# Patient Record
Sex: Male | Born: 1955 | Race: White | Hispanic: No | Smoking: Current every day smoker
Health system: Southern US, Community
[De-identification: ages and names within clinical notes are randomized; demographics above are authoritative.]

## PROBLEM LIST (undated history)

## (undated) DIAGNOSIS — I10 Essential (primary) hypertension: Secondary | ICD-10-CM

## (undated) DIAGNOSIS — E785 Hyperlipidemia, unspecified: Secondary | ICD-10-CM

## (undated) DIAGNOSIS — J309 Allergic rhinitis, unspecified: Secondary | ICD-10-CM

## (undated) DIAGNOSIS — R7309 Other abnormal glucose: Secondary | ICD-10-CM

## (undated) DIAGNOSIS — Z8619 Personal history of other infectious and parasitic diseases: Secondary | ICD-10-CM

## (undated) DIAGNOSIS — M1990 Unspecified osteoarthritis, unspecified site: Secondary | ICD-10-CM

## (undated) DIAGNOSIS — G4730 Sleep apnea, unspecified: Secondary | ICD-10-CM

## (undated) DIAGNOSIS — Z9289 Personal history of other medical treatment: Secondary | ICD-10-CM

## (undated) DIAGNOSIS — Z8719 Personal history of other diseases of the digestive system: Secondary | ICD-10-CM

## (undated) DIAGNOSIS — I471 Supraventricular tachycardia, unspecified: Secondary | ICD-10-CM

## (undated) DIAGNOSIS — J45909 Unspecified asthma, uncomplicated: Secondary | ICD-10-CM

## (undated) DIAGNOSIS — N529 Male erectile dysfunction, unspecified: Secondary | ICD-10-CM

## (undated) DIAGNOSIS — B029 Zoster without complications: Secondary | ICD-10-CM

## (undated) DIAGNOSIS — R0602 Shortness of breath: Secondary | ICD-10-CM

## (undated) DIAGNOSIS — E119 Type 2 diabetes mellitus without complications: Secondary | ICD-10-CM

## (undated) DIAGNOSIS — F528 Other sexual dysfunction not due to a substance or known physiological condition: Secondary | ICD-10-CM

## (undated) HISTORY — DX: Essential (primary) hypertension: I10

## (undated) HISTORY — DX: Supraventricular tachycardia, unspecified: I47.10

## (undated) HISTORY — DX: Unspecified asthma, uncomplicated: J45.909

## (undated) HISTORY — DX: Supraventricular tachycardia: I47.1

## (undated) HISTORY — DX: Allergic rhinitis, unspecified: J30.9

## (undated) HISTORY — DX: Hyperlipidemia, unspecified: E78.5

## (undated) HISTORY — DX: Other abnormal glucose: R73.09

## (undated) HISTORY — PX: OTHER SURGICAL HISTORY: SHX169

## (undated) HISTORY — DX: Other sexual dysfunction not due to a substance or known physiological condition: F52.8

## (undated) HISTORY — DX: Zoster without complications: B02.9

---

## 1998-01-26 ENCOUNTER — Emergency Department (HOSPITAL_COMMUNITY): Admission: EM | Admit: 1998-01-26 | Discharge: 1998-01-26 | Payer: Self-pay | Admitting: Emergency Medicine

## 1998-05-04 ENCOUNTER — Emergency Department (HOSPITAL_COMMUNITY): Admission: EM | Admit: 1998-05-04 | Discharge: 1998-05-04 | Payer: Self-pay | Admitting: Emergency Medicine

## 1999-10-06 ENCOUNTER — Emergency Department (HOSPITAL_COMMUNITY): Admission: EM | Admit: 1999-10-06 | Discharge: 1999-10-06 | Payer: Self-pay | Admitting: *Deleted

## 2001-07-18 ENCOUNTER — Encounter: Payer: Self-pay | Admitting: Emergency Medicine

## 2001-07-18 ENCOUNTER — Emergency Department (HOSPITAL_COMMUNITY): Admission: EM | Admit: 2001-07-18 | Discharge: 2001-07-18 | Payer: Self-pay | Admitting: Emergency Medicine

## 2001-07-20 ENCOUNTER — Encounter: Payer: Self-pay | Admitting: Emergency Medicine

## 2001-07-20 ENCOUNTER — Emergency Department (HOSPITAL_COMMUNITY): Admission: EM | Admit: 2001-07-20 | Discharge: 2001-07-20 | Payer: Self-pay | Admitting: Emergency Medicine

## 2001-08-08 ENCOUNTER — Emergency Department (HOSPITAL_COMMUNITY): Admission: EM | Admit: 2001-08-08 | Discharge: 2001-08-08 | Payer: Self-pay | Admitting: Emergency Medicine

## 2001-08-08 ENCOUNTER — Encounter: Payer: Self-pay | Admitting: Emergency Medicine

## 2001-08-23 ENCOUNTER — Emergency Department (HOSPITAL_COMMUNITY): Admission: EM | Admit: 2001-08-23 | Discharge: 2001-08-23 | Payer: Self-pay | Admitting: Emergency Medicine

## 2001-08-26 ENCOUNTER — Emergency Department (HOSPITAL_COMMUNITY): Admission: EM | Admit: 2001-08-26 | Discharge: 2001-08-26 | Payer: Self-pay | Admitting: Emergency Medicine

## 2001-09-14 ENCOUNTER — Ambulatory Visit (HOSPITAL_COMMUNITY): Admission: RE | Admit: 2001-09-14 | Discharge: 2001-09-14 | Payer: Self-pay | Admitting: Endocrinology

## 2001-09-14 ENCOUNTER — Encounter: Payer: Self-pay | Admitting: Endocrinology

## 2001-09-22 ENCOUNTER — Emergency Department (HOSPITAL_COMMUNITY): Admission: EM | Admit: 2001-09-22 | Discharge: 2001-09-22 | Payer: Self-pay

## 2001-10-29 ENCOUNTER — Emergency Department (HOSPITAL_COMMUNITY): Admission: EM | Admit: 2001-10-29 | Discharge: 2001-10-29 | Payer: Self-pay | Admitting: Emergency Medicine

## 2001-11-01 HISTORY — PX: OTHER SURGICAL HISTORY: SHX169

## 2003-03-17 ENCOUNTER — Emergency Department (HOSPITAL_COMMUNITY): Admission: EM | Admit: 2003-03-17 | Discharge: 2003-03-17 | Payer: Self-pay | Admitting: Emergency Medicine

## 2003-03-17 ENCOUNTER — Encounter: Payer: Self-pay | Admitting: Emergency Medicine

## 2003-05-26 HISTORY — PX: SHOULDER SURGERY: SHX246

## 2003-06-08 ENCOUNTER — Emergency Department (HOSPITAL_COMMUNITY): Admission: EM | Admit: 2003-06-08 | Discharge: 2003-06-08 | Payer: Self-pay | Admitting: Emergency Medicine

## 2003-06-13 ENCOUNTER — Ambulatory Visit (HOSPITAL_COMMUNITY): Admission: RE | Admit: 2003-06-13 | Discharge: 2003-06-13 | Payer: Self-pay | Admitting: Orthopedic Surgery

## 2003-06-13 ENCOUNTER — Ambulatory Visit (HOSPITAL_BASED_OUTPATIENT_CLINIC_OR_DEPARTMENT_OTHER): Admission: RE | Admit: 2003-06-13 | Discharge: 2003-06-13 | Payer: Self-pay | Admitting: Orthopedic Surgery

## 2003-08-10 ENCOUNTER — Emergency Department (HOSPITAL_COMMUNITY): Admission: EM | Admit: 2003-08-10 | Discharge: 2003-08-10 | Payer: Self-pay | Admitting: Emergency Medicine

## 2003-10-17 ENCOUNTER — Ambulatory Visit (HOSPITAL_COMMUNITY): Admission: RE | Admit: 2003-10-17 | Discharge: 2003-10-17 | Payer: Self-pay | Admitting: Orthopedic Surgery

## 2003-10-17 ENCOUNTER — Ambulatory Visit (HOSPITAL_BASED_OUTPATIENT_CLINIC_OR_DEPARTMENT_OTHER): Admission: RE | Admit: 2003-10-17 | Discharge: 2003-10-17 | Payer: Self-pay | Admitting: Orthopedic Surgery

## 2003-11-01 ENCOUNTER — Emergency Department (HOSPITAL_COMMUNITY): Admission: EM | Admit: 2003-11-01 | Discharge: 2003-11-01 | Payer: Self-pay | Admitting: Emergency Medicine

## 2004-02-18 ENCOUNTER — Emergency Department (HOSPITAL_COMMUNITY): Admission: EM | Admit: 2004-02-18 | Discharge: 2004-02-18 | Payer: Self-pay | Admitting: Emergency Medicine

## 2004-05-12 ENCOUNTER — Ambulatory Visit: Payer: Self-pay | Admitting: Endocrinology

## 2004-06-23 ENCOUNTER — Emergency Department (HOSPITAL_COMMUNITY): Admission: EM | Admit: 2004-06-23 | Discharge: 2004-06-23 | Payer: Self-pay | Admitting: Emergency Medicine

## 2004-06-30 ENCOUNTER — Emergency Department (HOSPITAL_COMMUNITY): Admission: EM | Admit: 2004-06-30 | Discharge: 2004-06-30 | Payer: Self-pay | Admitting: Emergency Medicine

## 2004-12-15 ENCOUNTER — Ambulatory Visit: Payer: Self-pay | Admitting: Endocrinology

## 2004-12-18 ENCOUNTER — Ambulatory Visit: Payer: Self-pay | Admitting: Endocrinology

## 2004-12-29 ENCOUNTER — Ambulatory Visit: Payer: Self-pay | Admitting: Endocrinology

## 2005-05-04 ENCOUNTER — Emergency Department (HOSPITAL_COMMUNITY): Admission: EM | Admit: 2005-05-04 | Discharge: 2005-05-04 | Payer: Self-pay | Admitting: Emergency Medicine

## 2005-10-08 ENCOUNTER — Emergency Department (HOSPITAL_COMMUNITY): Admission: EM | Admit: 2005-10-08 | Discharge: 2005-10-08 | Payer: Self-pay | Admitting: Emergency Medicine

## 2005-12-14 ENCOUNTER — Ambulatory Visit: Payer: Self-pay | Admitting: Endocrinology

## 2005-12-21 ENCOUNTER — Ambulatory Visit: Payer: Self-pay | Admitting: Endocrinology

## 2007-01-10 ENCOUNTER — Ambulatory Visit: Payer: Self-pay | Admitting: Endocrinology

## 2007-01-10 LAB — CONVERTED CEMR LAB
ALT: 41 units/L (ref 0–53)
Albumin: 3.6 g/dL (ref 3.5–5.2)
Alkaline Phosphatase: 54 units/L (ref 39–117)
BUN: 11 mg/dL (ref 6–23)
Basophils Absolute: 0.1 10*3/uL (ref 0.0–0.1)
Basophils Relative: 0.7 % (ref 0.0–1.0)
Bilirubin, Direct: 0.1 mg/dL (ref 0.0–0.3)
CO2: 26 meq/L (ref 19–32)
Calcium: 8.9 mg/dL (ref 8.4–10.5)
Creatinine, Ser: 0.8 mg/dL (ref 0.4–1.5)
Crystals: NEGATIVE
Eosinophils Absolute: 0.2 10*3/uL (ref 0.0–0.6)
Eosinophils Relative: 2.4 % (ref 0.0–5.0)
GFR calc Af Amer: 132 mL/min
GFR calc non Af Amer: 109 mL/min
HDL: 31 mg/dL — ABNORMAL LOW (ref >39.0)
Hemoglobin: 14.9 g/dL (ref 13.0–17.0)
Ketones, ur: NEGATIVE mg/dL
LDL Cholesterol: 70 mg/dL (ref 0–99)
Leukocytes, UA: NEGATIVE
Lymphocytes Relative: 25.4 % (ref 12.0–46.0)
MCHC: 34.7 g/dL (ref 30.0–36.0)
MCV: 90.5 fL (ref 78.0–100.0)
Monocytes Absolute: 0.3 10*3/uL (ref 0.2–0.7)
Monocytes Relative: 4.6 % (ref 3.0–11.0)
Neutro Abs: 5.1 10*3/uL (ref 1.4–7.7)
Neutrophils Relative %: 66.9 % (ref 43.0–77.0)
Nitrite: NEGATIVE
PSA: 0.48 ng/mL (ref 0.10–4.00)
Platelets: 172 10*3/uL (ref 150–400)
Potassium: 4.1 meq/L (ref 3.5–5.1)
RBC: 4.73 M/uL (ref 4.22–5.81)
RDW: 12.6 % (ref 11.5–14.6)
Sodium: 142 meq/L (ref 135–145)
Specific Gravity, Urine: 1.025 (ref 1.000–1.03)
TSH: 0.88 microintl units/mL (ref 0.35–5.50)
Total Bilirubin: 0.6 mg/dL (ref 0.3–1.2)
Total CHOL/HDL Ratio: 4.2
Total Protein, Urine: NEGATIVE mg/dL
Total Protein: 6.8 g/dL (ref 6.0–8.3)
Triglycerides: 148 mg/dL (ref 0–149)
Urine Glucose: NEGATIVE mg/dL
VLDL: 30 mg/dL (ref 0–40)
WBC: 7.6 10*3/uL (ref 4.5–10.5)
pH: 5.5 (ref 5.0–8.0)

## 2007-01-13 ENCOUNTER — Ambulatory Visit: Payer: Self-pay | Admitting: Endocrinology

## 2007-03-01 ENCOUNTER — Encounter: Payer: Self-pay | Admitting: *Deleted

## 2007-03-01 DIAGNOSIS — E785 Hyperlipidemia, unspecified: Secondary | ICD-10-CM | POA: Insufficient documentation

## 2007-03-01 DIAGNOSIS — F17200 Nicotine dependence, unspecified, uncomplicated: Secondary | ICD-10-CM | POA: Insufficient documentation

## 2007-03-01 DIAGNOSIS — J309 Allergic rhinitis, unspecified: Secondary | ICD-10-CM | POA: Insufficient documentation

## 2007-03-01 DIAGNOSIS — B029 Zoster without complications: Secondary | ICD-10-CM | POA: Insufficient documentation

## 2007-03-01 DIAGNOSIS — J45909 Unspecified asthma, uncomplicated: Secondary | ICD-10-CM | POA: Insufficient documentation

## 2007-03-01 DIAGNOSIS — F528 Other sexual dysfunction not due to a substance or known physiological condition: Secondary | ICD-10-CM | POA: Insufficient documentation

## 2007-03-01 DIAGNOSIS — R7309 Other abnormal glucose: Secondary | ICD-10-CM | POA: Insufficient documentation

## 2007-03-01 DIAGNOSIS — I10 Essential (primary) hypertension: Secondary | ICD-10-CM | POA: Insufficient documentation

## 2007-05-03 ENCOUNTER — Emergency Department (HOSPITAL_COMMUNITY): Admission: EM | Admit: 2007-05-03 | Discharge: 2007-05-03 | Payer: Self-pay | Admitting: Emergency Medicine

## 2007-10-11 ENCOUNTER — Emergency Department (HOSPITAL_COMMUNITY): Admission: EM | Admit: 2007-10-11 | Discharge: 2007-10-11 | Payer: Self-pay | Admitting: Emergency Medicine

## 2008-01-16 ENCOUNTER — Ambulatory Visit: Payer: Self-pay | Admitting: Endocrinology

## 2008-01-18 LAB — CONVERTED CEMR LAB
ALT: 23 units/L (ref 0–53)
AST: 16 units/L (ref 0–37)
Albumin: 3.6 g/dL (ref 3.5–5.2)
Alkaline Phosphatase: 56 units/L (ref 39–117)
BUN: 11 mg/dL (ref 6–23)
Basophils Absolute: 0.1 10*3/uL (ref 0.0–0.1)
Basophils Relative: 0.7 % (ref 0.0–3.0)
Bilirubin Urine: NEGATIVE
Bilirubin, Direct: 0.1 mg/dL (ref 0.0–0.3)
CO2: 29 meq/L (ref 19–32)
Calcium: 8.7 mg/dL (ref 8.4–10.5)
Chloride: 109 meq/L (ref 96–112)
Cholesterol: 114 mg/dL (ref 0–200)
Creatinine, Ser: 0.8 mg/dL (ref 0.4–1.5)
Crystals: NEGATIVE
Eosinophils Absolute: 0.2 10*3/uL (ref 0.0–0.7)
Eosinophils Relative: 1.9 % (ref 0.0–5.0)
GFR calc Af Amer: 131 mL/min
GFR calc non Af Amer: 108 mL/min
Glucose, Bld: 105 mg/dL — ABNORMAL HIGH (ref 70–99)
HCT: 44.7 % (ref 39.0–52.0)
HDL: 28.5 mg/dL — ABNORMAL LOW (ref >39.0)
Hemoglobin: 15.5 g/dL (ref 13.0–17.0)
Ketones, ur: NEGATIVE mg/dL
LDL Cholesterol: 66 mg/dL (ref 0–99)
Leukocytes, UA: NEGATIVE
Lymphocytes Relative: 25.6 % (ref 12.0–46.0)
MCHC: 34.6 g/dL (ref 30.0–36.0)
MCV: 91.9 fL (ref 78.0–100.0)
Monocytes Absolute: 0.5 10*3/uL (ref 0.1–1.0)
Monocytes Relative: 5.1 % (ref 3.0–12.0)
Mucus, UA: NEGATIVE
Neutro Abs: 6.6 10*3/uL (ref 1.4–7.7)
Neutrophils Relative %: 66.7 % (ref 43.0–77.0)
Nitrite: NEGATIVE
Platelets: 196 10*3/uL (ref 150–400)
Potassium: 3.8 meq/L (ref 3.5–5.1)
RBC: 4.87 M/uL (ref 4.22–5.81)
RDW: 12.5 % (ref 11.5–14.6)
Sodium: 145 meq/L (ref 135–145)
Specific Gravity, Urine: 1.02 (ref 1.000–1.03)
TSH: 1.15 microintl units/mL (ref 0.35–5.50)
Total Bilirubin: 0.7 mg/dL (ref 0.3–1.2)
Total CHOL/HDL Ratio: 4
Total Protein, Urine: NEGATIVE mg/dL
Total Protein: 6.8 g/dL (ref 6.0–8.3)
Triglycerides: 97 mg/dL (ref 0–149)
Urine Glucose: NEGATIVE mg/dL
Urobilinogen, UA: 0.2 (ref 0.0–1.0)
VLDL: 19 mg/dL (ref 0–40)
WBC: 10 10*3/uL (ref 4.5–10.5)
pH: 6 (ref 5.0–8.0)

## 2008-01-23 ENCOUNTER — Ambulatory Visit: Payer: Self-pay | Admitting: Endocrinology

## 2008-01-23 DIAGNOSIS — M25519 Pain in unspecified shoulder: Secondary | ICD-10-CM | POA: Insufficient documentation

## 2008-02-13 ENCOUNTER — Telehealth (INDEPENDENT_AMBULATORY_CARE_PROVIDER_SITE_OTHER): Payer: Self-pay | Admitting: *Deleted

## 2008-03-12 ENCOUNTER — Emergency Department (HOSPITAL_COMMUNITY): Admission: EM | Admit: 2008-03-12 | Discharge: 2008-03-12 | Payer: Self-pay | Admitting: Emergency Medicine

## 2008-12-26 ENCOUNTER — Emergency Department (HOSPITAL_COMMUNITY): Admission: EM | Admit: 2008-12-26 | Discharge: 2008-12-26 | Payer: Self-pay | Admitting: Emergency Medicine

## 2009-03-18 ENCOUNTER — Ambulatory Visit: Payer: Self-pay | Admitting: Endocrinology

## 2009-03-18 LAB — CONVERTED CEMR LAB
ALT: 25 units/L (ref 0–53)
AST: 17 units/L (ref 0–37)
Albumin: 3.8 g/dL (ref 3.5–5.2)
Alkaline Phosphatase: 56 units/L (ref 39–117)
Basophils Absolute: 0.1 10*3/uL (ref 0.0–0.1)
Basophils Relative: 0.9 % (ref 0.0–3.0)
Bilirubin, Direct: 0.1 mg/dL (ref 0.0–0.3)
CO2: 33 meq/L — ABNORMAL HIGH (ref 19–32)
Calcium: 9.3 mg/dL (ref 8.4–10.5)
Chloride: 100 meq/L (ref 96–112)
Cholesterol: 115 mg/dL (ref 0–200)
Creatinine, Ser: 0.8 mg/dL (ref 0.4–1.5)
Eosinophils Absolute: 0.2 10*3/uL (ref 0.0–0.7)
Eosinophils Relative: 1.9 % (ref 0.0–5.0)
GFR calc non Af Amer: 107.57 mL/min (ref >60)
Glucose, Bld: 99 mg/dL (ref 70–99)
HCT: 48.1 % (ref 39.0–52.0)
HDL: 36.8 mg/dL — ABNORMAL LOW (ref >39.00)
Hemoglobin: 16.4 g/dL (ref 13.0–17.0)
LDL Cholesterol: 59 mg/dL (ref 0–99)
Leukocytes, UA: NEGATIVE
Lymphocytes Relative: 28.6 % (ref 12.0–46.0)
Lymphs Abs: 2.9 10*3/uL (ref 0.7–4.0)
MCHC: 34.1 g/dL (ref 30.0–36.0)
MCV: 95.5 fL (ref 78.0–100.0)
Monocytes Absolute: 0.5 10*3/uL (ref 0.1–1.0)
Monocytes Relative: 5.4 % (ref 3.0–12.0)
Neutro Abs: 6.3 10*3/uL (ref 1.4–7.7)
Neutrophils Relative %: 63.2 % (ref 43.0–77.0)
Nitrite: NEGATIVE
PSA: 0.46 ng/mL (ref 0.10–4.00)
Platelets: 168 10*3/uL (ref 150.0–400.0)
Potassium: 3.6 meq/L (ref 3.5–5.1)
RBC: 5.03 M/uL (ref 4.22–5.81)
RDW: 12.7 % (ref 11.5–14.6)
Sodium: 143 meq/L (ref 135–145)
Specific Gravity, Urine: >=1.03 (ref 1.000–1.030)
Total Bilirubin: 0.8 mg/dL (ref 0.3–1.2)
Total CHOL/HDL Ratio: 3
Total Protein, Urine: NEGATIVE mg/dL
Total Protein: 7.1 g/dL (ref 6.0–8.3)
Triglycerides: 96 mg/dL (ref 0.0–149.0)
Urobilinogen, UA: 0.2 (ref 0.0–1.0)
VLDL: 19.2 mg/dL (ref 0.0–40.0)
pH: 6 (ref 5.0–8.0)

## 2009-03-21 ENCOUNTER — Ambulatory Visit: Payer: Self-pay | Admitting: Endocrinology

## 2009-03-21 DIAGNOSIS — R21 Rash and other nonspecific skin eruption: Secondary | ICD-10-CM | POA: Insufficient documentation

## 2009-04-11 ENCOUNTER — Encounter: Payer: Self-pay | Admitting: Endocrinology

## 2009-06-05 ENCOUNTER — Emergency Department (HOSPITAL_COMMUNITY): Admission: EM | Admit: 2009-06-05 | Discharge: 2009-06-05 | Payer: Self-pay | Admitting: Emergency Medicine

## 2009-08-25 ENCOUNTER — Emergency Department (HOSPITAL_COMMUNITY): Admission: EM | Admit: 2009-08-25 | Discharge: 2009-08-25 | Payer: Self-pay | Admitting: Emergency Medicine

## 2009-10-24 ENCOUNTER — Emergency Department (HOSPITAL_COMMUNITY): Admission: EM | Admit: 2009-10-24 | Discharge: 2009-10-24 | Payer: Self-pay | Admitting: Emergency Medicine

## 2010-02-24 ENCOUNTER — Emergency Department (HOSPITAL_COMMUNITY): Admission: EM | Admit: 2010-02-24 | Discharge: 2010-02-24 | Payer: Self-pay | Admitting: Emergency Medicine

## 2010-03-18 ENCOUNTER — Ambulatory Visit: Payer: Self-pay | Admitting: Endocrinology

## 2010-03-18 LAB — CONVERTED CEMR LAB
ALT: 24 units/L (ref 0–53)
AST: 28 units/L (ref 0–37)
Albumin: 3.7 g/dL (ref 3.5–5.2)
Alkaline Phosphatase: 54 units/L (ref 39–117)
BUN: 11 mg/dL (ref 6–23)
Basophils Absolute: 0.1 10*3/uL (ref 0.0–0.1)
Basophils Relative: 1 % (ref 0.0–3.0)
Bilirubin Urine: NEGATIVE
Bilirubin, Direct: 0.6 mg/dL — ABNORMAL HIGH (ref 0.0–0.3)
CO2: 29 meq/L (ref 19–32)
Calcium: 8.9 mg/dL (ref 8.4–10.5)
Chloride: 97 meq/L (ref 96–112)
Cholesterol: 115 mg/dL (ref 0–200)
Creatinine, Ser: 0.7 mg/dL (ref 0.4–1.5)
Eosinophils Absolute: 0.2 10*3/uL (ref 0.0–0.7)
Eosinophils Relative: 1.7 % (ref 0.0–5.0)
GFR calc non Af Amer: 117.25 mL/min (ref >60)
Glucose, Bld: 97 mg/dL (ref 70–99)
HCT: 46.8 % (ref 39.0–52.0)
HDL: 33.4 mg/dL — ABNORMAL LOW (ref >39.00)
Hemoglobin: 16 g/dL (ref 13.0–17.0)
Ketones, ur: NEGATIVE mg/dL
LDL Cholesterol: 61 mg/dL (ref 0–99)
Leukocytes, UA: NEGATIVE
Lymphocytes Relative: 27.8 % (ref 12.0–46.0)
Lymphs Abs: 2.8 10*3/uL (ref 0.7–4.0)
MCHC: 34.1 g/dL (ref 30.0–36.0)
MCV: 94.3 fL (ref 78.0–100.0)
Monocytes Absolute: 0.5 10*3/uL (ref 0.1–1.0)
Monocytes Relative: 5.3 % (ref 3.0–12.0)
Neutro Abs: 6.5 10*3/uL (ref 1.4–7.7)
Neutrophils Relative %: 64.2 % (ref 43.0–77.0)
Nitrite: NEGATIVE
PSA: 0.41 ng/mL (ref 0.10–4.00)
Platelets: 189 10*3/uL (ref 150.0–400.0)
Potassium: 5 meq/L (ref 3.5–5.1)
RBC: 4.97 M/uL (ref 4.22–5.81)
RDW: 13.4 % (ref 11.5–14.6)
Sodium: 138 meq/L (ref 135–145)
Specific Gravity, Urine: 1.025 (ref 1.000–1.030)
TSH: 1.2 microintl units/mL (ref 0.35–5.50)
Total Bilirubin: 1.8 mg/dL — ABNORMAL HIGH (ref 0.3–1.2)
Total CHOL/HDL Ratio: 3
Total Protein, Urine: NEGATIVE mg/dL
Total Protein: 6.5 g/dL (ref 6.0–8.3)
Triglycerides: 104 mg/dL (ref 0.0–149.0)
Urine Glucose: NEGATIVE mg/dL
Urobilinogen, UA: 0.2 (ref 0.0–1.0)
VLDL: 20.8 mg/dL (ref 0.0–40.0)
WBC: 10.2 10*3/uL (ref 4.5–10.5)
pH: 5.5 (ref 5.0–8.0)

## 2010-03-24 ENCOUNTER — Encounter: Payer: Self-pay | Admitting: Endocrinology

## 2010-03-24 ENCOUNTER — Ambulatory Visit: Payer: Self-pay | Admitting: Endocrinology

## 2010-03-24 DIAGNOSIS — R05 Cough: Secondary | ICD-10-CM

## 2010-03-24 DIAGNOSIS — R059 Cough, unspecified: Secondary | ICD-10-CM | POA: Insufficient documentation

## 2010-04-02 ENCOUNTER — Telehealth: Payer: Self-pay | Admitting: Endocrinology

## 2010-06-26 NOTE — Assessment & Plan Note (Signed)
Summary: Cpx/#/cd   Vital Signs:  Patient profile:   55 year old male Height:      75 inches (190.50 cm) Weight:      359.25 pounds (163.30 kg) BMI:     45.07 O2 Sat:      95 % on Room air Temp:     97.7 degrees F (36.50 degrees C) oral Pulse rate:   85 / minute BP sitting:   132 / 82  (left arm) Cuff size:   large  Vitals Entered By: Brenton Grills CMA Duncan Dull) (March 24, 2010 8:57 AM)  O2 Flow:  Room air CC: Physical/aj Comments Pt is due for colonoscopy   CC:  Physical/aj.  History of Present Illness: here for regular wellness examination.  He's feeling pretty well in general, and does not drink alcohol.  Current Medications (verified): 1)  Mevacor 40 Mg  Tabs (Lovastatin) .... Take 2 By Mouth Qhs 2)  Adult Aspirin Low Strength 81 Mg  Tbdp (Aspirin) .... Take 1 By Mouth Qd 3)  Proventil Hfa 108 (90 Base) Mcg/act  Aers (Albuterol Sulfate) .... Use Prn 4)  Albuterol 90 Mcg/act  Aers (Albuterol) .... Use Prn 5)  Cialis 20 Mg  Tabs (Tadalafil) .... Use Prn 6)  Tramadol Hcl 50 Mg Tabs (Tramadol Hcl) .... Take 1-2 By Mouth At Bedtime Prn 7)  Vicodin 5-500 Mg Tabs (Hydrocodone-Acetaminophen) .... Take 1-2 By Mouth At Bedtime 8)  Hyzaar 100-25 Mg Tabs (Losartan Potassium-Hctz) .... Qd 9)  Viagra 100 Mg Tabs (Sildenafil Citrate) .... For As Needed Use 10)  Levitra 20 Mg Tabs (Vardenafil Hcl) .... For As Needed Use 11)  Lotrisone 1-0.05 % Crea (Clotrimazole-Betamethasone) .... Three Times A Day As Needed Rash  Allergies (verified): No Known Drug Allergies  Family History: Reviewed history from 01/23/2008 and no changes required. no cancer  Social History: Reviewed history from 01/23/2008 and no changes required. Hydrologist married  Review of Systems       The patient complains of weight gain.  The patient denies fever, vision loss, decreased hearing, chest pain, syncope, headaches, abdominal pain, melena, hematochezia, severe indigestion/heartburn, hematuria,  suspicious skin lesions, and depression.    Physical Exam  General:  obese.  no distress  Neck:  Supple without thyroid enlargement or tenderness.  Heart:  Regular rate and rhythm without murmurs or gallops noted. Normal S1,S2.   Abdomen:  abdomen is soft, nontender.  no hepatosplenomegaly.   not distended.  no hernia central obesity.   Rectal:  normal external and internal exam.  heme neg  Prostate:  Normal size prostate without masses or tenderness.  Msk:  muscle bulk and strength are grossly normal.  no obvious joint swelling.  gait is normal and steady  Pulses:  dorsalis pedis intact bilat.  no carotid bruit  Extremities:  no deformity.  no ulcer on the feet.  feet are of normal color and temp.  no edema  Neurologic:  cn 2-12 grossly intact.   readily moves all 4's.   sensation is intact to touch on the feet  Skin:  normal texture and temp.  no rash.  not diaphoretic  Psych:  Alert and cooperative; normal mood and affect; normal attention span and concentration.   Additional Exam:  SEPARATE EVALUATION FOLLOWS--EACH PROBLEM HERE IS NEW, NOT RESPONDING TO TREATMENT, OR POSES SIGNIFICANT RISK TO THE PATIENT'S HEALTH: HISTORY OF THE PRESENT ILLNESS: pt states few years of a slight dry-quality cough, but no assoc sob.  he has been seen  a few times at [facility]. PAST MEDICAL HISTORY reviewed and up to date today REVIEW OF SYSTEMS: denies sore throat PHYSICAL EXAMINATION: head: no deformity eyes: no periorbital swelling, no proptosis external nose and ears are normal mouth: no lesion seen nodes:  none palpable at the neck chest:  clear to auscultation.  no respiratory distress LAB/XRAY RESULTS: pt says cxr was neg at [facility] 3 mos ago IMPRESSION: copd/asthma, with cough PLAN: see instrcution sheet   Impression & Recommendations:  Problem # 1:  ROUTINE GENERAL MEDICAL EXAM@HEALTH  CARE FACL (ICD-V70.0)  Medications Added to Medication List This Visit: 1)   Chantix Starting Month Pak 0.5 Mg X 11 & 1 Mg X 42 Tabs (Varenicline tartrate) .... Two times a day.  refill with continuing packs.  Other Orders: Gastroenterology Referral (GI) EKG w/ Interpretation ([address]) Pulmonary Referral (Pulmonary) Est. Patient Level III ([address]) Est. Patient 40-64 years ([address])   Patient Instructions: 1)  please let me know if you want to go to an informational meeting about weight-loss surgery. 2)  please consider these measures for your health:  minimize alcohol.  do not use tobacco products.  have a colonoscopy at least every 10 years from age 80.  keep firearms safely stored.  always use seat belts.  have working smoke alarms in your home.  see an eye doctor and dentist regularly.  never drive under the influence of alcohol or drugs (including prescription drugs).  those with fair skin should take precautions against the sun. 3)  please let me know what your wishes would be, if artificial life support measures should become necessary.  it is critically important to prevent falling down (keep floor areas well-lit, dry, and free of loose objects). 4)  here are some samples of diovan-hct 160/12.5, and of benicar 20/12.5.  you can take 2 a day of either of these (not both), to equal 1 hyzaar. 5)  you will be called with a day and time for an appointment for colonoscopy.   6)  i have sent a prescription for "chantix" to your pharmacy, and here is a coupon.  also, you should call (800) quit now. 7)  refer lung specialist.  you will be called with a day and time for an appointment. 8)  (update: we discussed code status.  pt requests full code, but would not want to be started or maintained on artificial life-support measures if there was not a reasonable chance of recovery). Prescriptions: CHANTIX STARTING MONTH PAK 0.5 MG X 11 & 1 MG X 42 TABS (VARENICLINE TARTRATE) two times a day.  refill with continuing packs.  #1 pack x 5   Entered and Authorized by:   Minus Breeding  MD   Signed by:   Minus Breeding MD on 03/24/2010   Method used:   Electronically to        [facility] (retail)       [address]       [place], [place]  [address]       Ph: [phone]       Fax: [phone]   RxID:   [id number] LEVITRA 20 MG TABS (VARDENAFIL HCL) for as needed use  #6 x 11   Entered and Authorized by:   Minus Breeding MD   Signed by:   Minus Breeding MD on 03/24/2010   Method used:   Electronically to  [facility] [address] [facility] (retail)       [address]       [place], [place]  [address]       Ph: [phone]       Fax: [phone]   RxID:   [id number]    Orders Added: 1)  Gastroenterology Referral [GI] 2)  EKG w/ Interpretation [93[address]] 3)  Pulmonary Referral [Pulmonary] 4)  Est. Patient Level III [[address]] 5)  Est. Patient 40-64 years [phone]

## 2010-06-26 NOTE — Progress Notes (Signed)
Summary: rx refill req  Phone Note Refill Request Message from:  Fax from Pharmacy on April 02, 2010 1:45 PM  Refills Requested: Medication #1:  HYZAAR 100-25 MG TABS qd   Dosage confirmed as above?Dosage Confirmed   Last Refilled: 03/11/2010  Medication #2:  MEVACOR 40 MG  TABS take 2 by mouth qhs   Dosage confirmed as above?Dosage Confirmed   Last Refilled: 02/28/2010  Method Requested: Electronic Next Appointment Scheduled: none Initial call taken by: Brenton Grills CMA Duncan Dull),  April 02, 2010 1:45 PM    Prescriptions: MEVACOR 40 MG  TABS (LOVASTATIN) take 2 by mouth qhs  #60 Tablet x 11   Entered by:   Brenton Grills CMA (AAMA)   Authorized by:   Minus Breeding MD   Signed by:   Brenton Grills CMA (AAMA) on 04/02/2010   Method used:   Electronically to        [facility] [address] (retail)       [address]       [place], [place]  [address]       Ph: [phone]       Fax: [phone]   RxID:   [id number] HYZAAR 100-25 MG TABS (LOSARTAN POTASSIUM-HCTZ) qd  #30 Tablet x 11   Entered by:   Brenton Grills CMA (AAMA)   Authorized by:   Minus Breeding MD   Signed by:   Brenton Grills CMA (AAMA) on 04/02/2010   Method used:   Electronically to        [facility] [address] (retail)       [address]       [place], [place]  [address]       Ph: [phone]       Fax: [phone]   RxID:   [id number]

## 2010-08-10 LAB — POCT I-STAT, CHEM 8
BUN: 7 mg/dL (ref 6–23)
Calcium, Ion: 1.1 mmol/L — ABNORMAL LOW (ref 1.12–1.32)
Chloride: 101 mEq/L (ref 96–112)
Glucose, Bld: 98 mg/dL (ref 70–99)
HCT: 49 % (ref 39.0–52.0)
Hemoglobin: 16.7 g/dL (ref 13.0–17.0)
Potassium: 3 mEq/L — ABNORMAL LOW (ref 3.5–5.1)
Sodium: 138 mEq/L (ref 135–145)

## 2010-08-13 LAB — URINE MICROSCOPIC-ADD ON

## 2010-08-13 LAB — GC/CHLAMYDIA PROBE AMP, GENITAL
Chlamydia, DNA Probe: NEGATIVE
GC Probe Amp, Genital: NEGATIVE

## 2010-08-13 LAB — URINE CULTURE
Colony Count: NO GROWTH
Culture: NO GROWTH

## 2010-08-13 LAB — URINALYSIS, ROUTINE W REFLEX MICROSCOPIC
Bilirubin Urine: NEGATIVE
Glucose, UA: NEGATIVE mg/dL
Ketones, ur: NEGATIVE mg/dL
Leukocytes, UA: NEGATIVE
Nitrite: NEGATIVE
Protein, ur: NEGATIVE mg/dL
Specific Gravity, Urine: 1.02 (ref 1.005–1.030)
Urobilinogen, UA: 0.2 mg/dL (ref 0.0–1.0)
pH: 6 (ref 5.0–8.0)

## 2010-08-30 LAB — COMPREHENSIVE METABOLIC PANEL
ALT: 23 U/L (ref 0–53)
AST: 21 U/L (ref 0–37)
Albumin: 3.6 g/dL (ref 3.5–5.2)
Alkaline Phosphatase: 55 U/L (ref 39–117)
BUN: 8 mg/dL (ref 6–23)
CO2: 28 mEq/L (ref 19–32)
Calcium: 8.7 mg/dL (ref 8.4–10.5)
Chloride: 104 mEq/L (ref 96–112)
Creatinine, Ser: 0.82 mg/dL (ref 0.4–1.5)
GFR calc Af Amer: >60 mL/min (ref >60)
GFR calc non Af Amer: >60 mL/min (ref >60)
Glucose, Bld: 102 mg/dL — ABNORMAL HIGH (ref 70–99)
Potassium: 3.7 mEq/L (ref 3.5–5.1)
Sodium: 139 mEq/L (ref 135–145)
Total Bilirubin: 0.6 mg/dL (ref 0.3–1.2)
Total Protein: 6.4 g/dL (ref 6.0–8.3)

## 2010-08-30 LAB — DIFFERENTIAL
Basophils Absolute: 0 10*3/uL (ref 0.0–0.1)
Basophils Relative: 0 % (ref 0–1)
Eosinophils Absolute: 0.2 10*3/uL (ref 0.0–0.7)
Eosinophils Relative: 2 % (ref 0–5)
Lymphocytes Relative: 23 % (ref 12–46)
Lymphs Abs: 2 10*3/uL (ref 0.7–4.0)
Monocytes Absolute: 0.4 10*3/uL (ref 0.1–1.0)
Monocytes Relative: 5 % (ref 3–12)
Neutro Abs: 6.2 10*3/uL (ref 1.7–7.7)
Neutrophils Relative %: 70 % (ref 43–77)

## 2010-08-30 LAB — CBC
HCT: 44.8 % (ref 39.0–52.0)
Hemoglobin: 15.7 g/dL (ref 13.0–17.0)
MCHC: 35.1 g/dL (ref 30.0–36.0)
MCV: 91.5 fL (ref 78.0–100.0)
Platelets: 174 10*3/uL (ref 150–400)
RBC: 4.89 MIL/uL (ref 4.22–5.81)
RDW: 13.3 % (ref 11.5–15.5)
WBC: 8.7 10*3/uL (ref 4.0–10.5)

## 2010-10-10 NOTE — Op Note (Signed)
[name], [name]                        ACCOUNT NO.:  [id number]   MEDICAL RECORD NO.:  [id number]                   PATIENT TYPE:  AMB   LOCATION:  DSC                                  FACILITY:  [facility]   PHYSICIAN:  Harvie Junior, M.D.                DATE OF BIRTH:  29-Sep-1955   DATE OF PROCEDURE:  06/13/2003  DATE OF DISCHARGE:                                 OPERATIVE REPORT   PREOPERATIVE DIAGNOSES:  1. Fracture of greater tuberosity.  2. Proximal humerus fracture.   POSTOPERATIVE DIAGNOSES:  1. Fracture of greater tuberosity.  2. Proximal humerus fracture.  3. Rotator cuff tear.   PROCEDURE:  1. Open reduction and internal fixation of proximal humerus fracture with     tuberosity fracture.  2. Repair of rotator cuff tear.   SURGEON:  Harvie Junior, M.D.   ASSISTANT:  Marshia Ly, P.A.   ANESTHESIA:  General.   ESTIMATED BLOOD LOSS:  200 mL.   BRIEF HISTORY:  Mr. [name] is a 55 year old male with a long history of  having fall at work.  He ultimately suffered a proximal humerus fracture.  He was evaluated in the emergency room and sent over for evaluation.  His  images showed that he had a proximal humerus fracture with a  greater  tuberosity that was displaced, elevated and rotated.  We talked about  treatment, and I ultimately obtained a CT scan which was somewhat helpful.  It showed that his greater tuberosity was displaced by about 15 mm and that  the proximal humerus surgical neck fracture was displaced about 1 cm.  We  discussed the treatment options and ultimately felt that open reduction and  internal fixation was the most appropriate course of action.  He was brought  to the operating room for this procedure.   DESCRIPTION OF PROCEDURE:  The patient was brought to the operating room  after adequate anesthesia was obtained with __________.  The patient was  brought to the operating table and was then moved in the beach chair  position, all parts  successfully padded.  Attention was then turned to the  left shoulder where a linear incision was made over the lateral aspect of  the left shoulder.  The subcutaneous tissues were dissected down to the  level of the deltoid muscle.  Flaps were raised.  A ruler was used to  measure 4 cm from the lateral edge of the deltoid and the deltoid was split  to 4 cm from the lateral edge.  A stitch was put in place here to prevent  further displacement distally.  At this point, attention was turned to the  proximal humerus where the fracture was easily identifiable.  Hematoma was  draining from the wound at this point. At this point, a rotator cuff tear  was identified at the proximal portion of the tuberosity fracture and going  proximally.  At  this point, the tuberosity was cleaned of all healing  fracture elements and the tuberosity could be removed and reduced  anatomically.  This was checked under fluoroscopy, and there did appear to  be a slight displacement of the surgical neck fracture which is our concern  in the first place, so at this point it became imperative, we thought, that  we fix the proximal humerus fracture as well as the greater tuberosity  fracture.  At this point, a Polaris nail (small) was opened and the proximal  humerus fracture was identified.  A guidewire was placed down the central  part of the canal and this was over reamed and then the rod was placed down  this canal.  Once the rod was placed, it was rotated such that the dead  central portion of the greater tuberosity fragment was rotated into the  central portion of the rod.  A screw with a washer was used at this point  and excellent fixation was achieved on the greater tuberosity at this point.  The second oblique screw was then used through this proximal area and also  was able to capture the fragment and great purchase was achieved distally  into the head.  At this point, attention was turned distally where the   distal most two screws were put in place.  Excellent purchase was achieved  with these two screws.  At this point, the guide was removed and under  fluoroscopic imaging, the rod was put through a range of motion.  The  tuberosity was well-reduced, no tendency towards motion or subluxation, and  there was no tendency towards displacement of surgical neck fracture.  At  this point, the rotator cuff tear which had been identified upon  introduction into the shoulder was repaired in a side-to-side fashion with  #2 FiberWire interrupted sutures, and this followed all the way to the  portion out laterally.  No suture anchors were felt necessary at this point.  The wound at this point was copiously irrigated and suctioned dry.  The rent  in the deltoid was closed with 0 Vicryl running suture.  The subcu was then  closed with 0 and 2-0 Vicryl and the skin with 3-0 Mersilene pull out  suture.  A sterile and compressive dressing was applied at this point and  the patient was then taken to the recovery room where he was noticed to be  in satisfactory condition.                                               Harvie Junior, M.D.    Ranae Plumber  D:  06/13/2003  T:  06/14/2003  Job:  [id number]

## 2010-10-10 NOTE — Op Note (Signed)
NAME:  [name], [name]                        ACCOUNT NO.:  [id number]   MEDICAL RECORD NO.:  [id number]                   PATIENT TYPE:  AMB   LOCATION:  DSC                                  FACILITY:  [facility]   PHYSICIAN:  Harvie Junior, M.D.                DATE OF BIRTH:  Dec 01, 1955   DATE OF PROCEDURE:  10/17/2003  DATE OF DISCHARGE:                                 OPERATIVE REPORT   PREOPERATIVE DIAGNOSIS:  Stiff left shoulder, status post rodding of  proximal humerus fracture with greater tuberosity fracture, head fracture,  and shaft component.   POSTOPERATIVE DIAGNOSIS:  Stiff left shoulder, status post rodding of  proximal humerus fracture with greater tuberosity fracture, head fracture,  and shaft component.   PRINCIPAL PROCEDURES:  1. Manipulation under anesthesia.  2. Removal of deep hardware.  3. Injection of shoulder, glenohumeral joint and subacromial space, under     fluoroscopy.   SURGEON:  Harvie Junior, M.D.   ASSISTANT:  Marshia Ly, P.A.   ANESTHESIA:  General.   BRIEF HISTORY:  This is a 55 year old male with a long history of having a  left proximal humerus fracture, a severe fracture that looked like it had a  head component, a greater tuberosity component.  It was displaced and the  greater tuberosity was elevated.  At that point we felt that the most  appropriate fixation for him would be intramedullary rodding with fixation  of the tuberosity piece.  He is brought to the operating room for that  procedure.  The patient then went on with physical therapy and had  significant limitations of motion and restriction felt to be mostly due to  the prominence of the screws proximally and so after discussion, we  ultimately elected to bring him for manipulation under anesthesia and screw  removal.   PROCEDURE:  The patient brought to the operating room and after an adequate  level of anesthesia obtained with a general anesthetic, the patient was  placed in the beach chair position and all bony prominences were well-  padded.  Attention was then turned to the left shoulder, which was  manipulated under anesthesia.  We were able to get to about 150 degrees of  overhead elevation and forward flexion position.  Abduction was limited to  about 80 degrees.  Under fluoroscopic imaging it appeared as though the  anteriormost screw was impinging the acromion, and it was felt that this was  at least involved in the limitation of his motion.  Really did not get a  tremendous fibrotic breaking up of scar tissue with motion, and he did have  some restrictions of internal and external rotation but they were thought to  be satisfactory limitations.  At this point attention was turned to under  fluoroscopic imaging the removal of the anterior screw.  This was done  through his primary stab incision.  We then made  a separate stab incision  and removed the posterior screw under fluoroscopy.  Following this the  shoulder was re-manipulated.  There was some improvement in abduction, maybe  15-20 degrees.  Overhead elevation did not significantly improve, internal  and external rotation had limited improvement.  We then pushed a little bit  harder and still did not get the sense that there was scar tissue that was  limiting this, and it was felt that it may be more a limitation of the  healing of the humeral head.  We examined the shoulder under fluoroscopy and  all was moving as a unit.  At this point the wounds were irrigated and  closed and a sterile compressive dressing was applied.  Prior to the  dressing being applied, 6 mL of Marcaine and 1 mg of Depo-Medrol was  instilled into the shoulder in the glenohumeral joint and 6 mL of Marcaine  and 1 mg of Depo-Medrol instilled into the subacromial space for  postoperative pain relief and also to help keep any scar tissue from wanting  to form.  The patient was then taken to the recovery room, where he  was  noted to be in satisfactory condition.  Estimated blood loss for this  procedure was none.                                               Harvie Junior, M.D.    Ranae Plumber  D:  10/17/2003  T:  10/17/2003  Job:  [id number]

## 2010-11-05 ENCOUNTER — Emergency Department (HOSPITAL_COMMUNITY): Payer: PRIVATE HEALTH INSURANCE

## 2010-11-05 ENCOUNTER — Emergency Department (HOSPITAL_COMMUNITY)
Admission: EM | Admit: 2010-11-05 | Discharge: 2010-11-05 | Disposition: A | Discharge status: Home / Self Care | Payer: PRIVATE HEALTH INSURANCE | Attending: Emergency Medicine | Admitting: Emergency Medicine

## 2010-11-05 DIAGNOSIS — R110 Nausea: Secondary | ICD-10-CM | POA: Insufficient documentation

## 2010-11-05 DIAGNOSIS — I10 Essential (primary) hypertension: Secondary | ICD-10-CM | POA: Insufficient documentation

## 2010-11-05 DIAGNOSIS — Z79899 Other long term (current) drug therapy: Secondary | ICD-10-CM | POA: Insufficient documentation

## 2010-11-05 DIAGNOSIS — R509 Fever, unspecified: Secondary | ICD-10-CM | POA: Insufficient documentation

## 2010-11-05 DIAGNOSIS — E785 Hyperlipidemia, unspecified: Secondary | ICD-10-CM | POA: Insufficient documentation

## 2010-11-05 DIAGNOSIS — R5381 Other malaise: Secondary | ICD-10-CM | POA: Insufficient documentation

## 2010-11-05 DIAGNOSIS — F17200 Nicotine dependence, unspecified, uncomplicated: Secondary | ICD-10-CM | POA: Insufficient documentation

## 2010-11-05 DIAGNOSIS — J40 Bronchitis, not specified as acute or chronic: Secondary | ICD-10-CM | POA: Insufficient documentation

## 2011-03-16 ENCOUNTER — Telehealth: Payer: Self-pay | Admitting: *Deleted

## 2011-03-16 DIAGNOSIS — Z0000 Encounter for general adult medical examination without abnormal findings: Secondary | ICD-10-CM

## 2011-03-16 DIAGNOSIS — Z0389 Encounter for observation for other suspected diseases and conditions ruled out: Secondary | ICD-10-CM

## 2011-03-16 NOTE — Telephone Encounter (Signed)
Labs placed into Epic for upcoming CPX appointment.  

## 2011-03-16 NOTE — Telephone Encounter (Signed)
Message copied by Carin Primrose on Mon Mar 16, 2011  9:33 AM ------      Message from: COUSIN, SHARON T      Created: Thu Mar 12, 2011  9:22 AM      Regarding: 03/26/11 PHY       THANKS

## 2011-03-20 ENCOUNTER — Other Ambulatory Visit (INDEPENDENT_AMBULATORY_CARE_PROVIDER_SITE_OTHER): Payer: PRIVATE HEALTH INSURANCE

## 2011-03-20 DIAGNOSIS — Z0000 Encounter for general adult medical examination without abnormal findings: Secondary | ICD-10-CM

## 2011-03-20 DIAGNOSIS — Z0389 Encounter for observation for other suspected diseases and conditions ruled out: Secondary | ICD-10-CM

## 2011-03-20 LAB — BASIC METABOLIC PANEL
CO2: 32 mEq/L (ref 19–32)
Calcium: 9 mg/dL (ref 8.4–10.5)
Creatinine, Ser: 0.8 mg/dL (ref 0.4–1.5)
GFR: 102.32 mL/min (ref >60.00)
Sodium: 143 mEq/L (ref 135–145)

## 2011-03-20 LAB — HEPATIC FUNCTION PANEL
Albumin: 3.7 g/dL (ref 3.5–5.2)
Bilirubin, Direct: 0.1 mg/dL (ref 0.0–0.3)
Total Protein: 6.7 g/dL (ref 6.0–8.3)

## 2011-03-20 LAB — URINALYSIS, ROUTINE W REFLEX MICROSCOPIC
Bilirubin Urine: NEGATIVE
Ketones, ur: NEGATIVE
Specific Gravity, Urine: 1.025 (ref 1.000–1.030)
Urine Glucose: NEGATIVE
pH: 6 (ref 5.0–8.0)

## 2011-03-20 LAB — LIPID PANEL
HDL: 38.2 mg/dL — ABNORMAL LOW (ref >39.00)
Total CHOL/HDL Ratio: 3
Triglycerides: 88 mg/dL (ref 0.0–149.0)
VLDL: 17.6 mg/dL (ref 0.0–40.0)

## 2011-03-20 LAB — PSA: PSA: 0.45 ng/mL (ref 0.10–4.00)

## 2011-03-20 LAB — CBC WITH DIFFERENTIAL/PLATELET
Basophils Absolute: 0.1 10*3/uL (ref 0.0–0.1)
Eosinophils Absolute: 0.2 10*3/uL (ref 0.0–0.7)
Lymphocytes Relative: 24.7 % (ref 12.0–46.0)
MCHC: 34 g/dL (ref 30.0–36.0)
Neutrophils Relative %: 66.9 % (ref 43.0–77.0)
Platelets: 177 10*3/uL (ref 150.0–400.0)
RDW: 13.6 % (ref 11.5–14.6)

## 2011-03-24 ENCOUNTER — Other Ambulatory Visit: Payer: Self-pay | Admitting: Endocrinology

## 2011-03-24 NOTE — Telephone Encounter (Signed)
Done

## 2011-03-26 ENCOUNTER — Ambulatory Visit (INDEPENDENT_AMBULATORY_CARE_PROVIDER_SITE_OTHER): Payer: PRIVATE HEALTH INSURANCE | Admitting: Endocrinology

## 2011-03-26 ENCOUNTER — Encounter: Payer: Self-pay | Admitting: Endocrinology

## 2011-03-26 ENCOUNTER — Other Ambulatory Visit: Payer: Self-pay | Admitting: *Deleted

## 2011-03-26 VITALS — BP 128/84 | HR 83 | Temp 98.8°F | Ht 75.5 in | Wt 364.0 lb

## 2011-03-26 DIAGNOSIS — Z0000 Encounter for general adult medical examination without abnormal findings: Secondary | ICD-10-CM | POA: Insufficient documentation

## 2011-03-26 DIAGNOSIS — F411 Generalized anxiety disorder: Secondary | ICD-10-CM

## 2011-03-26 MED ORDER — BUSPIRONE HCL 15 MG PO TABS: 15.0000 mg | ORAL_TABLET | Freq: Three times a day (TID) | ORAL | Status: DC

## 2011-03-26 NOTE — Patient Instructions (Signed)
please consider these measures for your health:  minimize alcohol.  do not use tobacco products.  have a colonoscopy at least every 10 years from age 54.  keep firearms safely stored.  always use seat belts.  have working smoke alarms in your home.  see an eye doctor and dentist regularly.  never drive under the influence of alcohol or drugs (including prescription drugs).  those with fair skin should take precautions against the sun. i have sent a prescription to your pharmacy, to take for anxiety.  If this doesn't help within 1 week, please call so i can double it.   Please return in 1 year.

## 2011-03-26 NOTE — Telephone Encounter (Signed)
[facility] called regarding rx for Cialis pharmacy. Pt needs rx for pharmacy.

## 2011-03-26 NOTE — Progress Notes (Signed)
Subjective:    Patient ID: [name], male    DOB: December 16, 1955, 55 y.o.   MRN: [id number]  HPI here for regular wellness examination.  He's feeling pretty well in general, and says chronic med probs are stable, except as noted below Past Medical History  Diagnosis Date  . ALLERGIC RHINITIS 03/01/2007  . ASTHMA 03/01/2007  . ERECTILE DYSFUNCTION 03/01/2007  . HYPERGLYCEMIA 03/01/2007  . HYPERLIPIDEMIA 03/01/2007  . HYPERTENSION 03/01/2007  . SHINGLES 03/01/2007    Past Surgical History  Procedure Date  . Stress cardiolite 11/01/2001    History   Social History  . Marital Status: Married    Spouse Name: N/A    Number of Children: N/A  . Years of Education: N/A   Occupational History  . [employer]    Social History Main Topics  . Smoking status: Current Everyday Smoker -- 1.5 packs/day for 42 years    Types: Cigarettes  . Smokeless tobacco: Not on file  . Alcohol Use: Not on file  . Drug Use: Not on file  . Sexually Active: Not on file   Other Topics Concern  . Not on file   Social History Narrative  . No narrative on file    Current Outpatient Prescriptions on File Prior to Visit  Medication Sig Dispense Refill  . albuterol (PROVENTIL,VENTOLIN) 90 MCG/ACT inhaler Inhale 2 puffs into the lungs as needed.        Marland Kitchen aspirin 81 MG tablet Take 81 mg by mouth daily.        . clotrimazole-betamethasone (LOTRISONE) cream Apply topically 3 (three) times daily. As needed for rash       . losartan-hydrochlorothiazide (HYZAAR) 100-25 MG per tablet Take 1 tablet by mouth daily.        Marland Kitchen lovastatin (MEVACOR) 40 MG tablet TAKE 2 TABLETS BY MOUTH AT BEDTIME  60 tablet  3    No Known Allergies  Family History  Problem Relation Age of Onset  . Cancer Neg Hx     BP 128/84  Pulse 83  Temp(Src) 98.8 F (37.1 C) (Oral)  Ht 6' 3.5" (1.918 m)  Wt 364 lb (165.109 kg)  BMI 44.90 kg/m2  SpO2 95%  Review of Systems  Constitutional: Negative for fever.  HENT: Negative  for hearing loss.   Eyes: Negative for visual disturbance.  Respiratory: Negative for shortness of breath.   Cardiovascular: Negative for chest pain.  Gastrointestinal: Negative for anal bleeding.  Genitourinary: Negative for hematuria.  Musculoskeletal: Positive for back pain.  Skin: Negative for rash.  Neurological: Negative for syncope and numbness.  Hematological: Does not bruise/bleed easily.      Objective:   Physical Exam VS: see vs page GEN: no distress HEAD: head: no deformity eyes: no periorbital swelling, no proptosis external nose and ears are normal mouth: no lesion seen NECK: supple, thyroid is not enlarged CHEST WALL: no deformity LUNGS: clear to auscultation BREASTS:  There is pseudogynecomastia CV: reg rate and rhythm, no murmur ABD: abdomen is soft, nontender.  no hepatosplenomegaly.  not distended.  no hernia RECTAL: normal external and internal exam.  heme neg. PROSTATE:  Normal size.  No nodule MUSCULOSKELETAL: muscle bulk and strength are grossly normal.  no obvious joint swelling.  gait is normal and steady EXTEMITIES: no deformity.  no ulcer on the feet.  feet are of normal color and temp.  no edema PULSES: dorsalis pedis intact bilat.  no carotid bruit NEURO:  cn 2-12 grossly intact.  readily moves all 4's.  sensation is intact to touch on the feet SKIN:  Normal texture and temperature.  No rash or suspicious lesion is visible.   NODES:  None palpable at the neck PSYCH: alert, oriented x3.  Does not appear anxious nor depressed.       Assessment & Plan:  Wellness visit today, with problems stable, except as noted.   SEPARATE EVALUATION FOLLOWS--EACH PROBLEM HERE IS NEW, NOT RESPONDING TO TREATMENT, OR POSES SIGNIFICANT RISK TO THE PATIENT'S HEALTH: HISTORY OF THE PRESENT ILLNESS: Pt states few years of moderate anxiety--he attributes to his children. PAST MEDICAL HISTORY reviewed and up to date today REVIEW OF SYSTEMS: Denies  depression PHYSICAL EXAMINATION: VITAL SIGNS:  See vs page GENERAL: no distress PSYCH: Alert and oriented x 3.  Does not appear anxious nor depressed. LAB/XRAY RESULTS: Lab Results  Component Value Date   TSH 1.33 03/20/2011  IMPRESSION: Anxiety, new PLAN: See instruction page

## 2011-03-26 NOTE — Telephone Encounter (Signed)
Do you want the 20 mg prn, or the 5 mg daily?

## 2011-03-27 MED ORDER — TADALAFIL 5 MG PO TABS: 5.0000 mg | ORAL_TABLET | Freq: Every day | ORAL | Status: AC | PRN

## 2011-03-27 NOTE — Telephone Encounter (Signed)
Pt wants rx for 5mg  daily.

## 2011-03-27 NOTE — Telephone Encounter (Signed)
Pt informed rx sent to pharmacy.

## 2011-03-27 NOTE — Telephone Encounter (Signed)
i sent rx 

## 2011-03-29 DIAGNOSIS — F411 Generalized anxiety disorder: Secondary | ICD-10-CM | POA: Insufficient documentation

## 2011-04-04 ENCOUNTER — Other Ambulatory Visit: Payer: Self-pay | Admitting: Endocrinology

## 2011-05-06 ENCOUNTER — Encounter: Payer: Self-pay | Admitting: Gastroenterology

## 2011-06-19 ENCOUNTER — Ambulatory Visit: Payer: PRIVATE HEALTH INSURANCE | Admitting: Endocrinology

## 2011-06-22 ENCOUNTER — Ambulatory Visit (INDEPENDENT_AMBULATORY_CARE_PROVIDER_SITE_OTHER): Payer: PRIVATE HEALTH INSURANCE | Admitting: Endocrinology

## 2011-06-22 ENCOUNTER — Encounter: Payer: Self-pay | Admitting: Endocrinology

## 2011-06-22 VITALS — BP 134/88 | HR 75 | Temp 97.8°F | Ht 76.0 in | Wt 361.0 lb

## 2011-06-22 DIAGNOSIS — G478 Other sleep disorders: Secondary | ICD-10-CM

## 2011-06-22 DIAGNOSIS — G479 Sleep disorder, unspecified: Secondary | ICD-10-CM

## 2011-06-22 DIAGNOSIS — G4733 Obstructive sleep apnea (adult) (pediatric): Secondary | ICD-10-CM | POA: Insufficient documentation

## 2011-06-22 NOTE — Patient Instructions (Signed)
Refer to a sleep specialist.  you will receive a phone call, about a day and time for an appointment.

## 2011-06-22 NOTE — Progress Notes (Signed)
  Subjective:    Patient ID: [name], male    DOB: 1955/10/03, 56 y.o.   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  He was awakened by coworkers.  Pt says wife says he snores.  He denies daytime drowsiness.  He takes no sleep meds.   Past Medical History  Diagnosis Date  . ALLERGIC RHINITIS 03/01/2007  . ASTHMA 03/01/2007  . ERECTILE DYSFUNCTION 03/01/2007  . HYPERGLYCEMIA 03/01/2007  . HYPERLIPIDEMIA 03/01/2007  . HYPERTENSION 03/01/2007  . SHINGLES 03/01/2007    Past Surgical History  Procedure Date  . Stress cardiolite 11/01/2001  . Shoulder surgery 2005    Left    History   Social History  . Marital Status: Married    Spouse Name: N/A    Number of Children: 2  . Years of Education: N/A   Occupational History  . [employer]    Social History Main Topics  . Smoking status: Current Everyday Smoker -- 2.0 packs/day for 31 years    Types: Cigarettes  . Smokeless tobacco: Not on file  . Alcohol Use: Not on file  . Drug Use: Not on file  . Sexually Active: Not on file   Other Topics Concern  . Not on file   Social History Narrative  . No narrative on file    Current Outpatient Prescriptions on File Prior to Visit  Medication Sig Dispense Refill  . albuterol (PROVENTIL,VENTOLIN) 90 MCG/ACT inhaler Inhale 2 puffs into the lungs as needed.        . clotrimazole-betamethasone (LOTRISONE) cream Apply topically 3 (three) times daily. As needed for rash       . losartan-hydrochlorothiazide (HYZAAR) 100-25 MG per tablet TAKE 1 TABLET BY MOUTH EVERY DAY  30 tablet  11  . lovastatin (MEVACOR) 40 MG tablet TAKE 2 TABLETS BY MOUTH AT BEDTIME  60 tablet  3  . aspirin 81 MG tablet Take 81 mg by mouth daily.          No Known Allergies  Family History  Problem Relation Age of Onset  . Cancer Neg Hx     BP 134/88  Pulse 75  Temp(Src) 97.8 F (36.6 C) (Oral)  Ht 6\' 4"  (1.93 m)  Wt 361 lb (163.749 kg)  BMI 43.94 kg/m2  SpO2  94%  Review of Systems Denies fever.      Objective:   Physical Exam VITAL SIGNS:  See vs page GENERAL: no distress.  Obese LUNGS:  Clear to auscultation.     Assessment & Plan:  Sleep disorder, new, uncertain etiology

## 2011-06-24 ENCOUNTER — Encounter: Payer: Self-pay | Admitting: Pulmonary Disease

## 2011-06-24 ENCOUNTER — Ambulatory Visit (HOSPITAL_BASED_OUTPATIENT_CLINIC_OR_DEPARTMENT_OTHER): Payer: PRIVATE HEALTH INSURANCE | Attending: Pulmonary Disease | Admitting: Radiology

## 2011-06-24 ENCOUNTER — Ambulatory Visit (INDEPENDENT_AMBULATORY_CARE_PROVIDER_SITE_OTHER): Payer: PRIVATE HEALTH INSURANCE | Admitting: Pulmonary Disease

## 2011-06-24 VITALS — BP 138/80 | HR 77 | Temp 98.1°F | Ht 76.0 in | Wt 363.2 lb

## 2011-06-24 VITALS — Ht 75.0 in | Wt 361.0 lb

## 2011-06-24 DIAGNOSIS — G4730 Sleep apnea, unspecified: Secondary | ICD-10-CM

## 2011-06-24 DIAGNOSIS — G4733 Obstructive sleep apnea (adult) (pediatric): Secondary | ICD-10-CM

## 2011-06-24 NOTE — Progress Notes (Signed)
  Subjective:    Patient ID: [name], male    DOB: [birth year], 56 y.o.   MRN: [id number]  HPI The patient is a 56 year old male who I've been asked to see for possible obstructive sleep apnea.  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  The patient has been noted to have loud snoring by his wife, as well as an abnormal breathing pattern during sleep.  He has frequent awakenings at night, but despite this feels that he is rested upon awakening.  The patient states he has the pressure with periods of inactivity on rare occasions, but this does not occur every day.  He denies sleepiness while watching television, but his wife does complain to him about falling asleep while watching.  He denies any sleepiness with driving.  The patient states that his weight is up about 25 pounds over the last 2 years, and his Epworth score today is only 5.  Sleep Questionnaire: What time do you typically go to bed?( Between what hours) 10:00 pm to 10:30 pm How long does it take you to fall asleep? 3 or 4 mins How many times during the night do you wake up? 5 What time do you get out of bed to start your day? 0330 Do you drive or operate heavy machinery in your occupation? Yes How much has your weight changed (up or down) over the past two years? (In pounds) 25 lb (11.34 kg) Have you ever had a sleep study before? No Do you currently use CPAP? No Do you wear oxygen at any time? No    Review of Systems  Constitutional: Negative for fever and unexpected weight change.  HENT: Positive for sneezing. Negative for ear pain, nosebleeds, congestion, sore throat, rhinorrhea, trouble swallowing, dental problem, postnasal drip and sinus pressure.   Eyes: Negative for redness and itching.  Respiratory: Positive for shortness of breath. Negative for cough, chest tightness and wheezing.   Cardiovascular: Negative for palpitations and leg swelling.  Gastrointestinal: Negative  for nausea and vomiting.  Genitourinary: Negative for dysuria.  Musculoskeletal: Negative for joint swelling.  Skin: Negative for rash.  Neurological: Negative for headaches.  Hematological: Does not bruise/bleed easily.  Psychiatric/Behavioral: Negative for dysphoric mood. The patient is not nervous/anxious.        Objective:   Physical Exam Constitutional:  Obese male, no acute distress  HENT:  Nares patent without discharge  Oropharynx without exudate, palate and uvula are very thick and elongated with side wall narrowing.  Eyes:  Perrla, eomi, no scleral icterus  Neck:  No JVD, no TMG  Cardiovascular:  Normal rate, regular rhythm, no rubs or gallops.  No murmurs        Intact distal pulses  Pulmonary :  Normal breath sounds, no stridor or respiratory distress   No rales, rhonchi, or wheezing  Abdominal:  Soft, nondistended, bowel sounds present.  No tenderness noted.   Musculoskeletal: 1+ lower extremity edema noted.  Lymph Nodes:  No cervical lymphadenopathy noted  Skin:  No cyanosis noted  Neurologic:  Alert, appropriate, moves all 4 extremities without obvious deficit.         Assessment & Plan:

## 2011-06-24 NOTE — Patient Instructions (Signed)
Will order sleep study, and call you with the results Work on weight loss Try and get more sleep Will arrange followup with me once results are available.

## 2011-06-24 NOTE — Assessment & Plan Note (Signed)
The patient's history is very suggestive of clinically significant obstructive sleep apnea.  The patient is obese, has very abnormal upper airway anatomy, and is in a very high risk occupation for daytime sleepiness.  I have had a long discussion with the patient about sleep apnea, including its impact to his quality of life and cardiovascular health.  He will need a sleep study for diagnosis, and the patient is agreeable.  I have asked him to stay out of work until we can make a diagnosis and get started on treatment.  I also encouraged him to work aggressively on weight loss.

## 2011-06-26 ENCOUNTER — Other Ambulatory Visit: Payer: Self-pay | Admitting: Pulmonary Disease

## 2011-06-26 DIAGNOSIS — G4733 Obstructive sleep apnea (adult) (pediatric): Secondary | ICD-10-CM

## 2011-06-26 DIAGNOSIS — I491 Atrial premature depolarization: Secondary | ICD-10-CM

## 2011-06-26 NOTE — Procedures (Signed)
NAMEMARQUINN, [name]              ACCOUNT NO.:  [id number]  MEDICAL RECORD NO.:  [id number]          PATIENT TYPE:  OUT  LOCATION:  SLEEP CENTER                 FACILITY:  [facility]  PHYSICIAN:  Barbaraann Share, MD,FCCPDATE OF BIRTH:  02[birth year]25-1956  DATE OF STUDY:  06/24/2011                           NOCTURNAL POLYSOMNOGRAM  REFERRING PHYSICIAN:  Barbaraann Share, MD,FCCP  REFERRING PHYSICIAN:  Barbaraann Share, MD,FCCP  INDICATION FOR STUDY:  Hypersomnia with sleep apnea.  EPWORTH SCORE:  8.  SLEEP ARCHITECTURE:  The patient had a total sleep time of 78 minutes with no slow-wave sleep or REM being achieved.  Sleep onset latency was prolonged at 115 minutes and sleep efficiency was very poor at 20%.  RESPIRATORY DATA:  The patient was found to have 28 obstructive apneas and 77 obstructive hypopneas, giving him an apnea-hypopnea index of 81 events per hour.  The events occurred in all body positions and there was loud snoring noted throughout.  The patient did not meet split night protocol secondary to the majority of his events occurring well after 2 a.m.  OXYGEN DATA:  There was O2 desaturation as low as 76% with the patient's obstructive events.  CARDIAC DATA:  Occasional PAC noted, but no clinically significant arrhythmias were seen.  MOVEMENTS/PARASOMNIA:  The patient had no significant leg jerks or other abnormal behaviors noted.  IMPRESSION/RECOMMENDATION: 1. Severe obstructive sleep apnea/hypopnea syndrome with an AHI of 81     events per hour and O2 desaturation as low as 76%.  Although the     patient had a decreased total sleep time, he had very severe apnea     any time that he was asleep.  Treatment for this degree of sleep     apnea should include CPAP as well as a trial of weight loss 2. Occasional premature atrial contractions but no clinically     significant arrhythmias were seen.    Barbaraann Share, MD,FCCP Diplomate, American Board of  Sleep Medicine Electronically Signed   [facility]  D:  06/26/2011 08:23:37  T:  06/26/2011 10:48:17  Job:  [id number]

## 2011-06-30 ENCOUNTER — Telehealth: Payer: Self-pay | Admitting: Pulmonary Disease

## 2011-06-30 NOTE — Telephone Encounter (Signed)
I spoke with [place] and she states he was set up on cpap 06/26/11. She states she spoke with pt earlier and stated he was doing fine with cpap machine and is not having any problems so far. Will forward to [facility] as an [place]

## 2011-07-01 ENCOUNTER — Telehealth: Payer: Self-pay | Admitting: Pulmonary Disease

## 2011-07-01 DIAGNOSIS — G4733 Obstructive sleep apnea (adult) (pediatric): Secondary | ICD-10-CM

## 2011-07-01 NOTE — Telephone Encounter (Signed)
Patient is aware and says he will need short term disability forms filled out if needing to wait another 4 weeks or longer before he can go back to work. Patient will get these forms and bring to [employer].

## 2011-07-01 NOTE — Telephone Encounter (Signed)
I spoke with pt and he states Dr. [name] put him out of work until he saw [facility]. He stated KC told him to call him this week and let him know how he was doing. Pt states he is doing well so far on the cpap. If he can be released to go back to work then he needs [facility] to write him a letter releasing him w/o any restrictions. Please advise Dr. Shelle Iron, thanks

## 2011-07-01 NOTE — Telephone Encounter (Signed)
I think he is ok to go back to work as long as he is wearing cpap, I just can't write a note that says "with no restrictions".  But can write a note saying he is being treated appropriately and is complying with treatment.  If this is not good enough and he feels he needs to do the disability thing, will need to provide paperwork. I think he should try my way first and see if it works.  Would just need download off his machine asap for last one week.

## 2011-07-01 NOTE — Telephone Encounter (Signed)
Patient returned call, advised of KC's recs.  Pt expressed worry that the note without the specific wording "with no restrictions" of medications, etc will not suffice with his HR/employer.  I informed patient that KC would like to try the letter first and if that does not work, then do the disability forms.  Patient was okay with this but requested that the disability forms be initiated anyway to cover him up to this point.  Pt to bring disability forms to [employer] tomorrow.  Order sent to [facility] for ASAP download of the last one week.  Patient is aware that [facility] will be out of the office until 2.13.13 and is okay with this as he is aware that information will be gathering during this interval.  Will forward back to [facility] to make him aware.

## 2011-07-01 NOTE — Telephone Encounter (Signed)
Writing him a note releasing him to work with NO RESTRICTIONS is not a simple process given his high risk job.  If I was doing this for [employer] for instance, it would take 4 weeks of cpap with a good report on his download before I could do this.  This puts me in a difficult position.  I would like to get a download off his machine asap from dme, and if ok, can write a letter saying he is compliant with his treatment regimen.  I cannot write a letter that says specifically "with no restrictions" until he has had more time on the device.  I suspect the note I will write will be adequate for him to return to work.  I am leaving town tomorrow and won't be back till next Wednesday.  I do not know if we can facilitate this before then.  Let me know what is decided.

## 2011-07-01 NOTE — Telephone Encounter (Signed)
LMOMTCB x 1 

## 2011-07-08 ENCOUNTER — Encounter (HOSPITAL_BASED_OUTPATIENT_CLINIC_OR_DEPARTMENT_OTHER): Payer: PRIVATE HEALTH INSURANCE

## 2011-07-09 NOTE — Telephone Encounter (Signed)
POt has returned call & would like to know if [facility] has completed this letter or not.  Please advise.  Pt can be reached at [phone].  Antionette Fairy

## 2011-07-09 NOTE — Telephone Encounter (Signed)
See my prior note.  I need the download data from his machine.  When I get this, will write note.

## 2011-07-09 NOTE — Telephone Encounter (Signed)
Pt is wanting to know if the letter has been done. i looked in epic and did not see anything. Please advise Dr. Shelle Iron thanks

## 2011-07-10 NOTE — Telephone Encounter (Signed)
I spoke with pt and made him aware of KC's response. He voiced his understanding and stated they download his machine last week and should have sent [place] the download. I called [employer] and spoke with Okey Regal and she is going to fax it over to our triage fax #. Will await fax

## 2011-07-10 NOTE — Telephone Encounter (Signed)
We have received download and was placed in [facility] VIP folder. Please advise Dr. Shelle Iron, thanks

## 2011-07-10 NOTE — Telephone Encounter (Signed)
I spoke with pt and advised him his letter has been done. Pt requested to pick this up. i advised will leave upfront for p/u. Pt asked me to read the letter to him and so i did. Pt agree'd w/ the letter. I have made a copy of the letter and placed in KC's scan folder. Letter has been placed upfront. Pt is already scheduled to see KC on 07/31/11 and pt is aware he needs to keep that apt. Nothing further was needed

## 2011-07-10 NOTE — Telephone Encounter (Signed)
Note written and given to triage.  Please make a copy to scan into chart.  Let pt know I need to see him in office the 1st to 2nd week of March to followup.

## 2011-07-15 ENCOUNTER — Telehealth: Payer: Self-pay | Admitting: Pulmonary Disease

## 2011-07-15 NOTE — Telephone Encounter (Signed)
Called and spoke with pt.  Informed him we have not received any paperwork recently on him.  Pt verbalized understanding and nothing further was needed.

## 2011-07-15 NOTE — Telephone Encounter (Signed)
[name] Millet have you seen any disability forms on this pt. Please advise thanks

## 2011-07-16 ENCOUNTER — Encounter: Payer: Self-pay | Admitting: Pulmonary Disease

## 2011-07-20 ENCOUNTER — Other Ambulatory Visit: Payer: Self-pay | Admitting: Endocrinology

## 2011-07-29 ENCOUNTER — Telehealth: Payer: Self-pay | Admitting: Pulmonary Disease

## 2011-07-29 NOTE — Telephone Encounter (Signed)
This was filled out yesterday.

## 2011-07-29 NOTE — Telephone Encounter (Signed)
KC, please advise.  Do you still have paperwork to be filled out from [employer] that was in the green folder on your dictation desk?

## 2011-07-29 NOTE — Telephone Encounter (Signed)
Called, spoke with pt.  States he was told by Elease Hashimoto in [employer] that she sent up a form from [employer] to be signed by Dr. Shelle Iron on Feb 27 but still has not received this form back yet.  Pt would like to know the status of this.  Megan, have you or Dr. Shelle Iron seen this?  Please advise.  Thank you.

## 2011-07-29 NOTE — Telephone Encounter (Signed)
Called and spoke with pt and informed him paperwork already filled out by [facility] and sent down to [employer] for processing. Nothing further needed.

## 2011-07-31 ENCOUNTER — Ambulatory Visit (INDEPENDENT_AMBULATORY_CARE_PROVIDER_SITE_OTHER): Payer: PRIVATE HEALTH INSURANCE | Admitting: Pulmonary Disease

## 2011-07-31 ENCOUNTER — Encounter: Payer: Self-pay | Admitting: Pulmonary Disease

## 2011-07-31 VITALS — BP 128/88 | HR 79 | Temp 98.2°F | Ht 76.0 in | Wt 365.0 lb

## 2011-07-31 DIAGNOSIS — G4733 Obstructive sleep apnea (adult) (pediatric): Secondary | ICD-10-CM

## 2011-07-31 NOTE — Progress Notes (Signed)
  Subjective:    Patient ID: [name], male    DOB: November 10, 1955, 56 y.o.   MRN: [id number]  HPI The patient comes in today for followup after starting on CPAP for his severe sleep apnea.  He is done.  Well with the device, and denies any issues with mask fit or pressure.  He is sleeping much better, his wife has heard no breakthrough snoring, and he reports no daytime sleepiness since being on the device.  He is very pleased with his progress.  I have reminded him that we have yet to optimize his pressure, but we'll do this over the coming weeks.   Review of Systems  Constitutional: Negative for fever and unexpected weight change.  HENT: Positive for congestion, rhinorrhea, sneezing, postnasal drip and sinus pressure. Negative for ear pain, nosebleeds, sore throat, trouble swallowing and dental problem.   Eyes: Positive for itching. Negative for redness.  Respiratory: Positive for cough, chest tightness and wheezing. Negative for shortness of breath.   Cardiovascular: Negative for palpitations and leg swelling.  Gastrointestinal: Negative for nausea and vomiting.  Genitourinary: Negative for dysuria.  Musculoskeletal: Negative for joint swelling.  Skin: Negative for rash.  Neurological: Positive for headaches.  Hematological: Does not bruise/bleed easily.  Psychiatric/Behavioral: Negative for dysphoric mood. The patient is not nervous/anxious.        Objective:   Physical Exam Obese male in no acute distress No skin breakdown or pressure necrosis from the CPAP mask Lower extremities with minimal edema, no cyanosis Alert and oriented, moves all 4 extremities.       Assessment & Plan:

## 2011-07-31 NOTE — Assessment & Plan Note (Signed)
The patient has responded extremely well to CPAP therapy, and feels that he is sleeping much better and no longer has daytime sleepiness.  At this point, we need to optimize his pressure, and I have also encouraged him to work aggressively on weight loss. Care Plan:  At this point, will arrange for the patient's machine to be changed over to auto mode for 2 weeks to optimize their pressure.  I will review the downloaded data once sent by dme, and also evaluate for compliance, leaks, and residual osa.  I will call the patient and dme to discuss the results, and have the patient's machine set appropriately.  This will serve as the pt's cpap pressure titration.

## 2011-07-31 NOTE — Patient Instructions (Signed)
Will optimize your pressure for you on auto setting for 2 weeks.  Will call you with the results. Work on weight loss followup with me in 6mos, but call me if you are having issues.

## 2011-09-13 ENCOUNTER — Other Ambulatory Visit: Payer: Self-pay | Admitting: Pulmonary Disease

## 2011-09-13 DIAGNOSIS — G4733 Obstructive sleep apnea (adult) (pediatric): Secondary | ICD-10-CM

## 2011-10-15 ENCOUNTER — Other Ambulatory Visit: Payer: Self-pay | Admitting: Pulmonary Disease

## 2011-10-15 DIAGNOSIS — G4733 Obstructive sleep apnea (adult) (pediatric): Secondary | ICD-10-CM

## 2011-12-05 ENCOUNTER — Emergency Department (HOSPITAL_COMMUNITY)
Admission: EM | Admit: 2011-12-05 | Discharge: 2011-12-05 | Disposition: A | Discharge status: Home / Self Care | Payer: No Typology Code available for payment source | Attending: Emergency Medicine | Admitting: Emergency Medicine

## 2011-12-05 ENCOUNTER — Encounter (HOSPITAL_COMMUNITY): Payer: Self-pay

## 2011-12-05 ENCOUNTER — Emergency Department (HOSPITAL_COMMUNITY): Payer: No Typology Code available for payment source

## 2011-12-05 DIAGNOSIS — R05 Cough: Secondary | ICD-10-CM | POA: Insufficient documentation

## 2011-12-05 DIAGNOSIS — Z79899 Other long term (current) drug therapy: Secondary | ICD-10-CM | POA: Insufficient documentation

## 2011-12-05 DIAGNOSIS — R0602 Shortness of breath: Secondary | ICD-10-CM | POA: Insufficient documentation

## 2011-12-05 DIAGNOSIS — R42 Dizziness and giddiness: Secondary | ICD-10-CM | POA: Insufficient documentation

## 2011-12-05 DIAGNOSIS — R059 Cough, unspecified: Secondary | ICD-10-CM | POA: Insufficient documentation

## 2011-12-05 DIAGNOSIS — M546 Pain in thoracic spine: Secondary | ICD-10-CM | POA: Insufficient documentation

## 2011-12-05 DIAGNOSIS — R209 Unspecified disturbances of skin sensation: Secondary | ICD-10-CM | POA: Insufficient documentation

## 2011-12-05 DIAGNOSIS — I10 Essential (primary) hypertension: Secondary | ICD-10-CM | POA: Insufficient documentation

## 2011-12-05 DIAGNOSIS — R509 Fever, unspecified: Secondary | ICD-10-CM | POA: Insufficient documentation

## 2011-12-05 DIAGNOSIS — E785 Hyperlipidemia, unspecified: Secondary | ICD-10-CM | POA: Insufficient documentation

## 2011-12-05 DIAGNOSIS — E876 Hypokalemia: Secondary | ICD-10-CM | POA: Insufficient documentation

## 2011-12-05 DIAGNOSIS — R252 Cramp and spasm: Secondary | ICD-10-CM | POA: Insufficient documentation

## 2011-12-05 DIAGNOSIS — R5381 Other malaise: Secondary | ICD-10-CM | POA: Insufficient documentation

## 2011-12-05 DIAGNOSIS — J45909 Unspecified asthma, uncomplicated: Secondary | ICD-10-CM | POA: Insufficient documentation

## 2011-12-05 DIAGNOSIS — Z7982 Long term (current) use of aspirin: Secondary | ICD-10-CM | POA: Insufficient documentation

## 2011-12-05 LAB — CBC WITH DIFFERENTIAL/PLATELET
HCT: 43.7 % (ref 39.0–52.0)
Hemoglobin: 15.1 g/dL (ref 13.0–17.0)
Lymphocytes Relative: 22 % (ref 12–46)
Lymphs Abs: 1.7 10*3/uL (ref 0.7–4.0)
Monocytes Absolute: 0.4 10*3/uL (ref 0.1–1.0)
Monocytes Relative: 6 % (ref 3–12)
Neutro Abs: 5.6 10*3/uL (ref 1.7–7.7)
Neutrophils Relative %: 71 % (ref 43–77)
RBC: 4.91 MIL/uL (ref 4.22–5.81)

## 2011-12-05 LAB — BASIC METABOLIC PANEL
BUN: 8 mg/dL (ref 6–23)
CO2: 29 mEq/L (ref 19–32)
Chloride: 96 mEq/L (ref 96–112)
GFR calc Af Amer: >90 mL/min (ref >90)
Glucose, Bld: 101 mg/dL — ABNORMAL HIGH (ref 70–99)
Potassium: 3.2 mEq/L — ABNORMAL LOW (ref 3.5–5.1)

## 2011-12-05 LAB — URINALYSIS, ROUTINE W REFLEX MICROSCOPIC
Bilirubin Urine: NEGATIVE
Glucose, UA: NEGATIVE mg/dL
Nitrite: NEGATIVE
Specific Gravity, Urine: 1.01 (ref 1.005–1.030)
pH: 7.5 (ref 5.0–8.0)

## 2011-12-05 LAB — GLUCOSE, CAPILLARY: Glucose-Capillary: 103 mg/dL — ABNORMAL HIGH (ref 70–99)

## 2011-12-05 LAB — URINE MICROSCOPIC-ADD ON

## 2011-12-05 MED ORDER — ALBUTEROL SULFATE (5 MG/ML) 0.5% IN NEBU
2.5000 mg | INHALATION_SOLUTION | Freq: Once | RESPIRATORY_TRACT | Status: AC
  Administered 2011-12-05: 2.5 mg via RESPIRATORY_TRACT
  Filled 2011-12-05: qty 0.5

## 2011-12-05 MED ORDER — SODIUM CHLORIDE 0.9 % IV SOLN: INTRAVENOUS | Status: DC

## 2011-12-05 MED ORDER — IPRATROPIUM BROMIDE 0.02 % IN SOLN
0.5000 mg | Freq: Once | RESPIRATORY_TRACT | Status: AC
  Administered 2011-12-05: 0.5 mg via RESPIRATORY_TRACT
  Filled 2011-12-05: qty 2.5

## 2011-12-05 MED ORDER — SODIUM CHLORIDE 0.9 % IV BOLUS (SEPSIS)
250.0000 mL | Freq: Once | INTRAVENOUS | Status: AC
  Administered 2011-12-05: 1000 mL via INTRAVENOUS

## 2011-12-05 MED ORDER — POTASSIUM CHLORIDE ER 10 MEQ PO TBCR: 10.0000 meq | EXTENDED_RELEASE_TABLET | Freq: Two times a day (BID) | ORAL | Status: DC

## 2011-12-05 MED ORDER — POTASSIUM CHLORIDE CRYS ER 20 MEQ PO TBCR
40.0000 meq | EXTENDED_RELEASE_TABLET | Freq: Once | ORAL | Status: AC
  Administered 2011-12-05: 40 meq via ORAL
  Filled 2011-12-05: qty 2

## 2011-12-05 MED ORDER — ALBUTEROL SULFATE HFA 108 (90 BASE) MCG/ACT IN AERS: 1.0000 | INHALATION_SPRAY | Freq: Four times a day (QID) | RESPIRATORY_TRACT | Status: DC | PRN

## 2011-12-05 NOTE — ED Notes (Signed)
Pt reports has had cough, difficulty breathing, pain between shoulder blades, and felt like has had fever x 3 days.  Reports woke up this am around 0130 and top of r leg was numb.  C/O feeling generally weak, dizzy, and trembling.

## 2011-12-05 NOTE — ED Notes (Signed)
Advised patient of side effects of inhalers. Verbalized understanding

## 2011-12-05 NOTE — ED Notes (Signed)
Return from xray, felt week when he stood up in xray

## 2011-12-05 NOTE — ED Provider Notes (Signed)
History     CSN: [id number]  Arrival date & time 12/05/11  1304   First MD Initiated Contact with Patient 12/05/11 1312      Chief Complaint  Patient presents with  . r leg numb     (Consider location/radiation/quality/duration/timing/severity/associated sxs/prior treatment) The history is provided by the patient.  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 Is in [place] [name]. Patient presents with several complaints complaint of cough difficulty breathing pain between shoulder blades and felt like his had a fever for the past 3 days. He states that this is how he felt when his had pneumonia before. Woke up this morning with numbness to the right thigh anteriorly which resolved and then also got a KF cramping his left leg that is still a little sore but resolved. Overall he has been feeling generally weak dizzy. Some cough has noted sometimes productive. Pain between the shoulder blades is more of an ache and mild probably less than 5/10 nonradiating no anterior chest pain no abdominal pain no nausea vomiting or diarrhea.  Past Medical History  Diagnosis Date  . ALLERGIC RHINITIS 03/01/2007  . ASTHMA 03/01/2007  . ERECTILE DYSFUNCTION 03/01/2007  . HYPERGLYCEMIA 03/01/2007  . HYPERLIPIDEMIA 03/01/2007  . HYPERTENSION 03/01/2007  . SHINGLES 03/01/2007    Past Surgical History  Procedure Date  . Stress cardiolite 11/01/2001  . Shoulder surgery 2005    Left    Family History  Problem Relation Age of Onset  . Cancer Neg Hx     History  Substance Use Topics  . Smoking status: Current Everyday Smoker -- 2.0 packs/day for 31 years    Types: Cigarettes  . Smokeless tobacco: Former Neurosurgeon  . Alcohol Use: No      Review of Systems  Constitutional: Positive for fever and fatigue. Negative for chills and diaphoresis.  HENT: Negative for congestion, facial swelling and neck pain.   Eyes: Negative for visual disturbance.  Respiratory: Positive for cough, shortness of  breath and wheezing. Negative for chest tightness.   Cardiovascular: Negative for chest pain and palpitations.  Gastrointestinal: Negative for nausea, vomiting, abdominal pain and diarrhea.  Genitourinary: Negative for dysuria.  Musculoskeletal: Positive for back pain.  Skin: Negative for rash.  Neurological: Negative for headaches.  Hematological: Does not bruise/bleed easily.    Allergies  Review of patient's allergies indicates no known allergies.  Home Medications   Current Outpatient Rx  Name Route Sig Dispense Refill  . ACETAMINOPHEN 500 MG PO TABS Oral Take 1,000 mg by mouth every 6 (six) hours as needed. For shoulder pain    . ALBUTEROL 90 MCG/ACT IN AERS Inhalation Inhale 2 puffs into the lungs as needed. For shortness of breath    . ASPIRIN 81 MG PO TABS Oral Take 81 mg by mouth daily.     Marland Kitchen LOSARTAN POTASSIUM-HCTZ 100-25 MG PO TABS  TAKE 1 TABLET BY MOUTH EVERY DAY 30 tablet 11  . LOVASTATIN 40 MG PO TABS  TAKE 2 TABLETS BY MOUTH AT BEDTIME 60 tablet 5    BP 133/85  Pulse 89  Temp 98.3 F (36.8 C) (Oral)  Resp 20  Ht 6\' 3"  (1.905 m)  Wt 365 lb (165.563 kg)  BMI 45.62 kg/m2  SpO2 96%  Physical Exam  Nursing note and vitals reviewed. Constitutional: He is oriented to person, place, and time. He appears well-developed and well-nourished. No distress.  HENT:  Head: Normocephalic and atraumatic.  Mouth/Throat: Oropharynx is clear and moist.  Eyes:  Conjunctivae and EOM are normal. Pupils are equal, round, and reactive to light.  Neck: Normal range of motion. Neck supple.  Cardiovascular: Normal rate, regular rhythm and normal heart sounds.   No murmur heard. Pulmonary/Chest: Effort normal and breath sounds normal. No respiratory distress. He has no wheezes. He has no rales. He exhibits no tenderness.  Abdominal: Soft. Bowel sounds are normal. There is no tenderness.  Musculoskeletal: Normal range of motion. He exhibits no edema.  Neurological: He is alert and  oriented to person, place, and time. No cranial nerve deficit. He exhibits normal muscle tone. Coordination normal.  Skin: Skin is warm. No rash noted.    ED Course  Procedures (including critical care time)  Labs Reviewed  BASIC METABOLIC PANEL - Abnormal; Notable for the following:    Sodium 134 (*)     Potassium 3.2 (*)     Glucose, Bld 101 (*)     All other components within normal limits  GLUCOSE, CAPILLARY - Abnormal; Notable for the following:    Glucose-Capillary 103 (*)     All other components within normal limits  CBC WITH DIFFERENTIAL  TROPONIN I  URINALYSIS, ROUTINE W REFLEX MICROSCOPIC   No results found.  Date: 12/05/2011  Rate: 76  Rhythm: normal sinus rhythm and sinus arrhythmia  QRS Axis: normal  Intervals: normal  ST/T Wave abnormalities: normal  Conduction Disutrbances:none  Narrative Interpretation:   Old EKG Reviewed: unchanged No sniffing change in EKG from 11/05/2010 Results for orders placed during the hospital encounter of 12/05/11  CBC WITH DIFFERENTIAL      Component Value Range   WBC 7.9  4.0 - 10.5 K/uL   RBC 4.91  4.22 - 5.81 MIL/uL   Hemoglobin 15.1  13.0 - 17.0 g/dL   HCT 98.1  19.1 - 47.8 %   MCV 89.0  78.0 - 100.0 fL   MCH 30.8  26.0 - 34.0 pg   MCHC 34.6  30.0 - 36.0 g/dL   RDW 29.5  62.1 - 30.8 %   Platelets 156  150 - 400 K/uL   Neutrophils Relative 71  43 - 77 %   Neutro Abs 5.6  1.7 - 7.7 K/uL   Lymphocytes Relative 22  12 - 46 %   Lymphs Abs 1.7  0.7 - 4.0 K/uL   Monocytes Relative 6  3 - 12 %   Monocytes Absolute 0.4  0.1 - 1.0 K/uL   Eosinophils Relative 1  0 - 5 %   Eosinophils Absolute 0.1  0.0 - 0.7 K/uL   Basophils Relative 0  0 - 1 %   Basophils Absolute 0.0  0.0 - 0.1 K/uL  BASIC METABOLIC PANEL      Component Value Range   Sodium 134 (*) 135 - 145 mEq/L   Potassium 3.2 (*) 3.5 - 5.1 mEq/L   Chloride 96  96 - 112 mEq/L   CO2 29  19 - 32 mEq/L   Glucose, Bld 101 (*) 70 - 99 mg/dL   BUN 8  6 - 23 mg/dL    Creatinine, Ser 6.57  0.50 - 1.35 mg/dL   Calcium 9.2  8.4 - 84.6 mg/dL   GFR calc non Af Amer >90  >90 mL/min   GFR calc Af Amer >90  >90 mL/min  TROPONIN I      Component Value Range   Troponin I <0.30  <0.30 ng/mL  GLUCOSE, CAPILLARY      Component Value Range   Glucose-Capillary 103 (*)  70 - 99 mg/dL     1. Hypokalemia       MDM  Chest x-ray still pending node sniffing and leukocytosis based on electrolytes mild hypokalemia patient will receive 40 mEq of potassium orally. Patient feels better after the first albuterol nebulizer treatment with Atrovent we'll repeat. Chest x-ray without any significant findings. Troponin is negative EKG without acute changes suspect symptoms may have been some exacerbation of his asthma and perhaps the hypokalemia related to some of them the muscular and symptoms and numbness. Do not feel that stroke related. As stated patient feels much better after the breathing treatments.       Shelda Jakes, MD 12/05/11 [phone]

## 2011-12-21 ENCOUNTER — Emergency Department (HOSPITAL_COMMUNITY)
Admission: EM | Admit: 2011-12-21 | Discharge: 2011-12-21 | Disposition: A | Discharge status: Home / Self Care | Payer: No Typology Code available for payment source | Attending: Emergency Medicine | Admitting: Emergency Medicine

## 2011-12-21 ENCOUNTER — Encounter (HOSPITAL_COMMUNITY): Payer: Self-pay | Admitting: Emergency Medicine

## 2011-12-21 ENCOUNTER — Emergency Department (HOSPITAL_COMMUNITY): Payer: No Typology Code available for payment source

## 2011-12-21 DIAGNOSIS — I10 Essential (primary) hypertension: Secondary | ICD-10-CM | POA: Insufficient documentation

## 2011-12-21 DIAGNOSIS — F17200 Nicotine dependence, unspecified, uncomplicated: Secondary | ICD-10-CM | POA: Insufficient documentation

## 2011-12-21 DIAGNOSIS — Z7982 Long term (current) use of aspirin: Secondary | ICD-10-CM | POA: Insufficient documentation

## 2011-12-21 DIAGNOSIS — E669 Obesity, unspecified: Secondary | ICD-10-CM | POA: Insufficient documentation

## 2011-12-21 DIAGNOSIS — R5381 Other malaise: Secondary | ICD-10-CM | POA: Insufficient documentation

## 2011-12-21 DIAGNOSIS — J45909 Unspecified asthma, uncomplicated: Secondary | ICD-10-CM | POA: Insufficient documentation

## 2011-12-21 DIAGNOSIS — E785 Hyperlipidemia, unspecified: Secondary | ICD-10-CM | POA: Insufficient documentation

## 2011-12-21 DIAGNOSIS — R531 Weakness: Secondary | ICD-10-CM

## 2011-12-21 LAB — COMPREHENSIVE METABOLIC PANEL
ALT: 17 U/L (ref 0–53)
Albumin: 3.4 g/dL — ABNORMAL LOW (ref 3.5–5.2)
Alkaline Phosphatase: 61 U/L (ref 39–117)
Glucose, Bld: 114 mg/dL — ABNORMAL HIGH (ref 70–99)
Potassium: 3.7 mEq/L (ref 3.5–5.1)
Sodium: 139 mEq/L (ref 135–145)
Total Protein: 6.7 g/dL (ref 6.0–8.3)

## 2011-12-21 LAB — CBC
Hemoglobin: 15.2 g/dL (ref 13.0–17.0)
MCHC: 35.1 g/dL (ref 30.0–36.0)
WBC: 9.8 10*3/uL (ref 4.0–10.5)

## 2011-12-21 LAB — POCT I-STAT TROPONIN I: Troponin i, poc: 0 ng/mL (ref 0.00–0.08)

## 2011-12-21 MED ORDER — ALBUTEROL SULFATE (5 MG/ML) 0.5% IN NEBU
5.0000 mg | INHALATION_SOLUTION | Freq: Once | RESPIRATORY_TRACT | Status: AC
  Administered 2011-12-21: 5 mg via RESPIRATORY_TRACT
  Filled 2011-12-21: qty 1

## 2011-12-21 NOTE — ED Notes (Signed)
Pt presenting to ed with c/o "I have been feeling dizzy and lightheaded with shortness of breath  for a couple of weeks'. Pt denies chest pain, nausea and vomiting at this time.

## 2011-12-21 NOTE — ED Provider Notes (Signed)
History     CSN: [id number]  Arrival date & time 12/21/11  7829   First MD Initiated Contact with Patient 12/21/11 1027      Chief Complaint  Patient presents with  . Dizziness    (Consider location/radiation/quality/duration/timing/severity/associated sxs/prior treatment) HPI Complains of dizziness meaning lightheadedness and shortness of breath with cough, nonproductive and wheeze for 2 weeks also reports had right several days ago which has since resolved. Patient seen by Dr. Deretha Emory 12/05/2011 for similar complaint diagnosed with hypokalemia. Nothing makes symptoms better or worse no other associated symptoms Past Medical History  Diagnosis Date  . ALLERGIC RHINITIS 03/01/2007  . ASTHMA 03/01/2007  . ERECTILE DYSFUNCTION 03/01/2007  . HYPERGLYCEMIA 03/01/2007  . HYPERLIPIDEMIA 03/01/2007  . HYPERTENSION 03/01/2007  . SHINGLES 03/01/2007    Past Surgical History  Procedure Date  . Stress cardiolite 11/01/2001  . Shoulder surgery 2005    Left    Family History  Problem Relation Age of Onset  . Cancer Neg Hx     History  Substance Use Topics  . Smoking status: Current Everyday Smoker -- 2.0 packs/day for 31 years    Types: Cigarettes  . Smokeless tobacco: Former Neurosurgeon  . Alcohol Use: No      Review of Systems  Constitutional: Negative.   HENT: Negative.   Respiratory: Positive for cough and shortness of breath.   Cardiovascular: Negative.   Gastrointestinal: Negative.   Musculoskeletal: Negative.   Skin: Negative.   Neurological: Positive for light-headedness.  Hematological: Negative.   Psychiatric/Behavioral: Negative.   All other systems reviewed and are negative.    Allergies  Review of patient's allergies indicates no known allergies.  Home Medications   Current Outpatient Rx  Name Route Sig Dispense Refill  . ALBUTEROL 90 MCG/ACT IN AERS Inhalation Inhale 2 puffs into the lungs as needed. For shortness of breath    . ASPIRIN 81 MG PO TABS Oral  Take 81 mg by mouth daily.     Marland Kitchen LOSARTAN POTASSIUM-HCTZ 100-25 MG PO TABS  TAKE 1 TABLET BY MOUTH EVERY DAY 30 tablet 11  . LOVASTATIN 40 MG PO TABS  TAKE 2 TABLETS BY MOUTH AT BEDTIME 60 tablet 5  . ACETAMINOPHEN 500 MG PO TABS Oral Take 1,000 mg by mouth every 6 (six) hours as needed. For shoulder pain    . POTASSIUM CHLORIDE ER 10 MEQ PO TBCR Oral Take 1 tablet (10 mEq total) by mouth 2 (two) times daily. 14 tablet 0    BP 158/79  Pulse 81  Temp 98 F (36.7 C) (Oral)  Resp 19  SpO2 99%  Physical Exam  Nursing note and vitals reviewed. Constitutional: He appears well-developed and well-nourished.  HENT:  Head: Normocephalic and atraumatic.  Right Ear: External ear normal.  Left Ear: External ear normal.       Bilateral tympanic membranes normal  Eyes: Conjunctivae are normal. Pupils are equal, round, and reactive to light.  Neck: Neck supple. No tracheal deviation present. No thyromegaly present.  Cardiovascular: Normal rate and regular rhythm.   No murmur heard. Pulmonary/Chest: Effort normal and breath sounds normal.       Prolonged expiratory phase but or wheezes speaks in paragraphs  Abdominal: Soft. Bowel sounds are normal. He exhibits no distension. There is no tenderness.       Obese  Musculoskeletal: Normal range of motion. He exhibits no edema and no tenderness.  Neurological: He is alert. Coordination normal.  Skin: Skin is warm and dry. No rash  noted.  Psychiatric: He has a normal mood and affect.    ED Course  Procedures (including critical care time)  Date: 12/21/2011  Rate: 70  Rhythm: normal sinus rhythm  QRS Axis: normal  Intervals: normal  ST/T Wave abnormalities: normal  Conduction Disutrbances: none  Narrative Interpretation: unremarkable Unchanged from 12/05/11  Chest xray reviewed by me  Labs Reviewed  CBC  COMPREHENSIVE METABOLIC PANEL   No results found.  Results for orders placed during the hospital encounter of 12/21/11  CBC       Component Value Range   WBC 9.8  4.0 - 10.5 K/uL   RBC 4.89  4.22 - 5.81 MIL/uL   Hemoglobin 15.2  13.0 - 17.0 g/dL   HCT 16.1  09.6 - 04.5 %   MCV 88.5  78.0 - 100.0 fL   MCH 31.1  26.0 - 34.0 pg   MCHC 35.1  30.0 - 36.0 g/dL   RDW 40.9  81.1 - 91.4 %   Platelets 181  150 - 400 K/uL  COMPREHENSIVE METABOLIC PANEL      Component Value Range   Sodium 139  135 - 145 mEq/L   Potassium 3.7  3.5 - 5.1 mEq/L   Chloride 102  96 - 112 mEq/L   CO2 25  19 - 32 mEq/L   Glucose, Bld 114 (*) 70 - 99 mg/dL   BUN 11  6 - 23 mg/dL   Creatinine, Ser 7.82  0.50 - 1.35 mg/dL   Calcium 9.2  8.4 - 95.6 mg/dL   Total Protein 6.7  6.0 - 8.3 g/dL   Albumin 3.4 (*) 3.5 - 5.2 g/dL   AST 14  0 - 37 U/L   ALT 17  0 - 53 U/L   Alkaline Phosphatase 61  39 - 117 U/L   Total Bilirubin 0.2 (*) 0.3 - 1.2 mg/dL   GFR calc non Af Amer >90  >90 mL/min   GFR calc Af Amer >90  >90 mL/min  POCT I-STAT TROPONIN I      Component Value Range   Troponin i, poc 0.00  0.00 - 0.08 ng/mL   Comment 3            Dg Chest 2 View  12/21/2011  *RADIOLOGY REPORT*  Clinical Data: Shortness of breath, cough, congestion.  CHEST - 2 VIEW  Comparison: 12/05/2011  Findings: Cardiomegaly.  Mild peribronchial thickening and hyperinflation.  No confluent opacities or effusions.  No overt edema.  No acute bony abnormality.  IMPRESSION: Cardiomegaly, probable chronic obstructive lung disease and chronic bronchitis.  Original Report Authenticated By: Cyndie Chime, M.D.   Dg Chest 2 View  12/05/2011  *RADIOLOGY REPORT*  Clinical Data: COPD, chronic bronchitis, shortness of breath, cough, smoker, hypertension.  CHEST - 2 VIEW  Comparison: 11/05/2010 and 10/24/2009.  Findings: Detail is limited by body habitus.  The heart size and mediastinal contours are stable.  There is stable central airway thickening and hyperinflation consistent with chronic obstructive pulmonary disease.  No superimposed edema or airspace disease is seen.  There is no  pleural effusion.  Postsurgical changes are again noted at the left shoulder.  IMPRESSION: Stable chronic obstructive lung disease.  No acute cardiopulmonary process identified.  Original Report Authenticated By: Gerrianne Scale, M.D.    No diagnosis found.  1:10 PM feels much improved after treatment with albuterol nebulized treatment. He is not lightheaded on standing.  MDM  Patient may be relatively hypotensive due to over medication We  will decrease Hyzaar dosage to 50-12.5. Albuterol 2 puffs every 4 hours when necessary cough or shortness of breath Smoking cessation and encouraged. (spent 5 mniutes councilling pt on smoking cessation).  Diagnoses #1 weakness Diagnosis #2 COPD #3 tobacco abuse        Doug Sou, MD 12/21/11 1319

## 2011-12-21 NOTE — ED Notes (Signed)
RT notified

## 2012-01-17 ENCOUNTER — Other Ambulatory Visit: Payer: Self-pay | Admitting: Endocrinology

## 2012-02-01 ENCOUNTER — Encounter (HOSPITAL_COMMUNITY): Payer: Self-pay | Admitting: *Deleted

## 2012-02-01 ENCOUNTER — Emergency Department (HOSPITAL_COMMUNITY)
Admission: EM | Admit: 2012-02-01 | Discharge: 2012-02-01 | Disposition: A | Discharge status: Home / Self Care | Payer: PRIVATE HEALTH INSURANCE | Attending: Emergency Medicine | Admitting: Emergency Medicine

## 2012-02-01 ENCOUNTER — Emergency Department (HOSPITAL_COMMUNITY): Payer: PRIVATE HEALTH INSURANCE

## 2012-02-01 ENCOUNTER — Ambulatory Visit: Payer: PRIVATE HEALTH INSURANCE | Admitting: Pulmonary Disease

## 2012-02-01 DIAGNOSIS — R05 Cough: Secondary | ICD-10-CM

## 2012-02-01 DIAGNOSIS — R071 Chest pain on breathing: Secondary | ICD-10-CM | POA: Insufficient documentation

## 2012-02-01 DIAGNOSIS — Z79899 Other long term (current) drug therapy: Secondary | ICD-10-CM | POA: Insufficient documentation

## 2012-02-01 DIAGNOSIS — R0789 Other chest pain: Secondary | ICD-10-CM

## 2012-02-01 DIAGNOSIS — J45909 Unspecified asthma, uncomplicated: Secondary | ICD-10-CM | POA: Insufficient documentation

## 2012-02-01 DIAGNOSIS — I10 Essential (primary) hypertension: Secondary | ICD-10-CM | POA: Insufficient documentation

## 2012-02-01 DIAGNOSIS — E785 Hyperlipidemia, unspecified: Secondary | ICD-10-CM | POA: Insufficient documentation

## 2012-02-01 DIAGNOSIS — R059 Cough, unspecified: Secondary | ICD-10-CM | POA: Insufficient documentation

## 2012-02-01 DIAGNOSIS — F17200 Nicotine dependence, unspecified, uncomplicated: Secondary | ICD-10-CM | POA: Insufficient documentation

## 2012-02-01 MED ORDER — PREDNISONE 20 MG PO TABS
60.0000 mg | ORAL_TABLET | Freq: Once | ORAL | Status: AC
  Administered 2012-02-01: 60 mg via ORAL

## 2012-02-01 MED ORDER — GUAIFENESIN-CODEINE 100-10 MG/5ML PO SYRP: 5.0000 mL | ORAL_SOLUTION | Freq: Three times a day (TID) | ORAL | Status: AC | PRN

## 2012-02-01 MED ORDER — HYDROCODONE-ACETAMINOPHEN 5-325 MG PO TABS
1.0000 | ORAL_TABLET | Freq: Once | ORAL | Status: AC
  Administered 2012-02-01: 1 via ORAL
  Filled 2012-02-01: qty 1

## 2012-02-01 MED ORDER — PREDNISONE 10 MG PO TABS: 20.0000 mg | ORAL_TABLET | Freq: Every day | ORAL | Status: DC

## 2012-02-01 MED ORDER — HYDROCODONE-ACETAMINOPHEN 5-325 MG PO TABS: 1.0000 | ORAL_TABLET | ORAL | Status: AC | PRN

## 2012-02-01 MED ORDER — IPRATROPIUM BROMIDE 0.02 % IN SOLN
0.5000 mg | Freq: Once | RESPIRATORY_TRACT | Status: AC
  Administered 2012-02-01: 0.5 mg via RESPIRATORY_TRACT
  Filled 2012-02-01: qty 2.5

## 2012-02-01 MED ORDER — ALBUTEROL SULFATE (5 MG/ML) 0.5% IN NEBU
5.0000 mg | INHALATION_SOLUTION | Freq: Once | RESPIRATORY_TRACT | Status: AC
  Administered 2012-02-01: 5 mg via RESPIRATORY_TRACT
  Filled 2012-02-01: qty 1

## 2012-02-01 MED ORDER — PREDNISONE 10 MG PO TABS
ORAL_TABLET | ORAL | Status: AC
  Filled 2012-02-01: qty 6

## 2012-02-01 MED ORDER — GUAIFENESIN-CODEINE 100-10 MG/5ML PO SOLN
5.0000 mL | Freq: Once | ORAL | Status: AC
  Administered 2012-02-01: 5 mL via ORAL
  Filled 2012-02-01: qty 5

## 2012-02-01 NOTE — ED Notes (Signed)
Pt back from x-ray.

## 2012-02-01 NOTE — ED Notes (Signed)
Pt has had cough for several days, Saturday he coughed and felt like he pulled a muscle under his left breast and down through his lower back.

## 2012-02-01 NOTE — ED Provider Notes (Signed)
History     CSN: [id number]  Arrival date & time 02/01/12  0430   First MD Initiated Contact with Patient 02/01/12 [phone]      Chief Complaint  Patient presents with  . Cough  . Muscle Pain  . Back Pain    (Consider location/radiation/quality/duration/timing/severity/associated sxs/prior treatment) HPI [name] is a 56 y.o. male with a h/o asthma and chronic bronchitis who presents to the Emergency Department complaining of cough for several days associated with rib and chest pain with coughing, wheezing, and phlegm production . Denies fever, chills, nausea, vomiting, shortness of breath.He continues to smoke. He continues to use his albuterol inhaler.  PCP  Dr. [name]   Past Medical History  Diagnosis Date  . ALLERGIC RHINITIS 03/01/2007  . ASTHMA 03/01/2007  . ERECTILE DYSFUNCTION 03/01/2007  . HYPERGLYCEMIA 03/01/2007  . HYPERLIPIDEMIA 03/01/2007  . HYPERTENSION 03/01/2007  . SHINGLES 03/01/2007    Past Surgical History  Procedure Date  . Stress cardiolite 11/01/2001  . Shoulder surgery 2005    Left    Family History  Problem Relation Age of Onset  . Cancer Neg Hx     History  Substance Use Topics  . Smoking status: Current Everyday Smoker -- 2.0 packs/day for 31 years    Types: Cigarettes  . Smokeless tobacco: Former Neurosurgeon  . Alcohol Use: No      Review of Systems  Constitutional: Negative for fever.       10 Systems reviewed and are negative for acute change except as noted in the HPI.  HENT: Negative for congestion.   Eyes: Negative for discharge and redness.  Respiratory: Positive for cough and wheezing. Negative for shortness of breath.        Rib and chest pain  Cardiovascular: Negative for chest pain.  Gastrointestinal: Negative for vomiting and abdominal pain.  Musculoskeletal: Negative for back pain.  Skin: Negative for rash.  Neurological: Negative for syncope, numbness and headaches.  Psychiatric/Behavioral:       No behavior change.     Allergies  Review of patient's allergies indicates no known allergies.  Home Medications   Current Outpatient Rx  Name Route Sig Dispense Refill  . ACETAMINOPHEN 500 MG PO TABS Oral Take 1,000 mg by mouth every 6 (six) hours as needed. For shoulder pain    . ALBUTEROL 90 MCG/ACT IN AERS Inhalation Inhale 2 puffs into the lungs as needed. For shortness of breath    . ASPIRIN 81 MG PO TABS Oral Take 81 mg by mouth daily.     Marland Kitchen LOSARTAN POTASSIUM-HCTZ 100-25 MG PO TABS  TAKE 1 TABLET BY MOUTH EVERY DAY 30 tablet 11  . LOVASTATIN 40 MG PO TABS  TAKE 2 TABLETS BY MOUTH AT BEDTIME 60 tablet 4  . POTASSIUM CHLORIDE ER 10 MEQ PO TBCR Oral Take 1 tablet (10 mEq total) by mouth 2 (two) times daily. 14 tablet 0    BP 144/80  Pulse 71  Temp 98.2 F (36.8 C)  Resp 20  SpO2 93%  Physical Exam  Nursing note and vitals reviewed. Constitutional: He appears well-developed and well-nourished.       Awake, alert, nontoxic appearance.  HENT:  Head: Atraumatic.  Right Ear: External ear normal.  Left Ear: External ear normal.  Nose: Nose normal.  Mouth/Throat: Oropharynx is clear and moist.  Eyes: Right eye exhibits no discharge. Left eye exhibits no discharge.  Neck: Normal range of motion. Neck supple.  Cardiovascular: Normal heart sounds.  Pulmonary/Chest: Effort normal. He has wheezes. He exhibits tenderness.  Abdominal: Soft. There is no tenderness. There is no rebound.  Musculoskeletal: He exhibits no tenderness.       Baseline ROM, no obvious new focal weakness.  Neurological:       Mental status and motor strength appears baseline for patient and situation.  Skin: No rash noted.  Psychiatric: He has a normal mood and affect.    ED Course  Procedures (including critical care time)  Dg Chest 2 View  02/01/2012  *RADIOLOGY REPORT*  Clinical Data: Cough.  Muscle pain.  Back pain.  CHEST - 2 VIEW  Comparison: 12/21/2011  Findings: Cardiac enlargement with mild pulmonary vascular  congestion and interstitial changes suggesting edema. Emphysematous changes and scattered fibrosis in the lungs.  No focal airspace consolidation.  Blunting of the left costophrenic angles suggesting fluid or thickened pleura.  No pneumothorax. Mediastinal contours appear intact.  IMPRESSION: Cardiac enlargement with mild pulmonary vascular congestion and edema.  Fluid or thickened pleura in the left costophrenic angle. Emphysematous changes.   Original Report Authenticated By: Marlon Pel, M.D.     MDM  Patient with h/o asthma and chronic bronchitis here with wheezing and pain with coughing. Chest xray without acute findings. Nebulizer treatment with improvement in wheezing. Given analgesic with improvement in pain. Initiated steroid therapy.Pt stable in ED with no significant deterioration in condition.The patient appears reasonably screened and/or stabilized for discharge and I doubt any other medical condition or other [facility] requiring further screening, evaluation, or treatment in the ED at this time prior to discharge.  MDM Reviewed: nursing note and vitals Interpretation: x-ray           Nicoletta Dress. Colon Branch, MD 02/01/12 [phone]

## 2012-02-09 ENCOUNTER — Encounter: Payer: Self-pay | Admitting: Endocrinology

## 2012-02-09 ENCOUNTER — Ambulatory Visit (INDEPENDENT_AMBULATORY_CARE_PROVIDER_SITE_OTHER): Payer: PRIVATE HEALTH INSURANCE | Admitting: Endocrinology

## 2012-02-09 VITALS — BP 132/82 | HR 87 | Temp 98.2°F | Ht 75.0 in | Wt 354.0 lb

## 2012-02-09 DIAGNOSIS — R0609 Other forms of dyspnea: Secondary | ICD-10-CM

## 2012-02-09 DIAGNOSIS — R0600 Dyspnea, unspecified: Secondary | ICD-10-CM

## 2012-02-09 DIAGNOSIS — R0989 Other specified symptoms and signs involving the circulatory and respiratory systems: Secondary | ICD-10-CM

## 2012-02-09 DIAGNOSIS — F17200 Nicotine dependence, unspecified, uncomplicated: Secondary | ICD-10-CM

## 2012-02-09 DIAGNOSIS — R918 Other nonspecific abnormal finding of lung field: Secondary | ICD-10-CM | POA: Insufficient documentation

## 2012-02-09 NOTE — Progress Notes (Signed)
  Subjective:    Patient ID: [name], male    DOB: [birth year], 56 y.o.   MRN: [id number]  HPI Pt will have left shoulder surgery soon, but she says a date has not been set.  pt states he feels well in general.  Pt states of slight doe sensation in the chest, but no assoc cough.  He took a course prednisone 2 weeks ago.   Past Medical History  Diagnosis Date  . ALLERGIC RHINITIS 03/01/2007  . ASTHMA 03/01/2007  . ERECTILE DYSFUNCTION 03/01/2007  . HYPERGLYCEMIA 03/01/2007  . HYPERLIPIDEMIA 03/01/2007  . HYPERTENSION 03/01/2007  . SHINGLES 03/01/2007    Past Surgical History  Procedure Date  . Stress cardiolite 11/01/2001  . Shoulder surgery 2005    Left    History   Social History  . Marital Status: Married    Spouse Name: N/A    Number of Children: 2  . Years of Education: N/A   Occupational History  . [employer]    Social History Main Topics  . Smoking status: Current Every Day Smoker -- 2.0 packs/day for 31 years    Types: Cigarettes  . Smokeless tobacco: Former Neurosurgeon  . Alcohol Use: No  . Drug Use: No  . Sexually Active: Not on file   Other Topics Concern  . Not on file   Social History Narrative  . No narrative on file    Current Outpatient Prescriptions on File Prior to Visit  Medication Sig Dispense Refill  . acetaminophen (TYLENOL) 500 MG tablet Take 1,000 mg by mouth every 6 (six) hours as needed. For shoulder pain      . albuterol (PROVENTIL,VENTOLIN) 90 MCG/ACT inhaler Inhale 2 puffs into the lungs as needed. For shortness of breath      . aspirin 81 MG tablet Take 81 mg by mouth daily.       Marland Kitchen losartan-hydrochlorothiazide (HYZAAR) 100-25 MG per tablet TAKE 1 TABLET BY MOUTH EVERY DAY  30 tablet  11  . lovastatin (MEVACOR) 40 MG tablet TAKE 2 TABLETS BY MOUTH AT BEDTIME  60 tablet  4  . potassium chloride (K-DUR) 10 MEQ tablet Take 1 tablet (10 mEq total) by mouth 2 (two) times daily.  14 tablet  0  . predniSONE (DELTASONE) 10 MG tablet Take 2  tablets (20 mg total) by mouth daily.  10 tablet  0   No Known Allergies  Family History  Problem Relation Age of Onset  . Cancer Neg Hx     BP 132/82  Pulse 87  Temp 98.2 F (36.8 C) (Oral)  Ht 6\' 3"  (1.905 m)  Wt 354 lb (160.573 kg)  BMI 44.25 kg/m2  SpO2 97%  Review of Systems Denies chest pain and LOC    Objective:   Physical Exam VITAL SIGNS:  See vs page GENERAL: no distress LUNGS:  Clear to auscultation HEART:  Regular rate and rhythm without murmurs noted. Normal S1,S2.     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 Due to diastolic dysfunction Smoker.  He should quit prior to surgery

## 2012-02-09 NOTE — Patient Instructions (Addendum)
Let's check an "echocardiogram."  you will receive a phone call, about a day and time for an appointment. Bring your mask to the surgery. Please try to quit smoking in anticipation of the surgery.

## 2012-02-15 ENCOUNTER — Other Ambulatory Visit (HOSPITAL_COMMUNITY): Payer: PRIVATE HEALTH INSURANCE

## 2012-02-16 ENCOUNTER — Ambulatory Visit (HOSPITAL_COMMUNITY): Payer: PRIVATE HEALTH INSURANCE | Attending: Cardiology | Admitting: Radiology

## 2012-02-16 ENCOUNTER — Encounter: Payer: Self-pay | Admitting: Endocrinology

## 2012-02-16 DIAGNOSIS — R0600 Dyspnea, unspecified: Secondary | ICD-10-CM

## 2012-02-16 DIAGNOSIS — I10 Essential (primary) hypertension: Secondary | ICD-10-CM | POA: Insufficient documentation

## 2012-02-16 DIAGNOSIS — R918 Other nonspecific abnormal finding of lung field: Secondary | ICD-10-CM

## 2012-02-16 DIAGNOSIS — R0989 Other specified symptoms and signs involving the circulatory and respiratory systems: Secondary | ICD-10-CM | POA: Insufficient documentation

## 2012-02-16 DIAGNOSIS — R0609 Other forms of dyspnea: Secondary | ICD-10-CM | POA: Insufficient documentation

## 2012-02-16 DIAGNOSIS — J438 Other emphysema: Secondary | ICD-10-CM | POA: Insufficient documentation

## 2012-02-16 DIAGNOSIS — F17200 Nicotine dependence, unspecified, uncomplicated: Secondary | ICD-10-CM | POA: Insufficient documentation

## 2012-02-16 NOTE — Progress Notes (Signed)
Echocardiogram performed.  

## 2012-02-18 ENCOUNTER — Telehealth: Payer: Self-pay | Admitting: *Deleted

## 2012-02-18 DIAGNOSIS — Z0000 Encounter for general adult medical examination without abnormal findings: Secondary | ICD-10-CM

## 2012-02-18 DIAGNOSIS — Z0389 Encounter for observation for other suspected diseases and conditions ruled out: Secondary | ICD-10-CM

## 2012-02-18 NOTE — Telephone Encounter (Signed)
CPX place into Epic for upcoming appointment.

## 2012-02-18 NOTE — Telephone Encounter (Signed)
Message copied by Carin Primrose on Thu Feb 18, 2012  8:21 AM ------      Message from: COUSIN, [place] T      Created: Tue Feb 09, 2012  9:50 AM      Regarding: PHY DATE  03/28/12       THANKS

## 2012-03-10 ENCOUNTER — Other Ambulatory Visit: Payer: Self-pay | Admitting: Orthopedic Surgery

## 2012-03-21 ENCOUNTER — Other Ambulatory Visit (INDEPENDENT_AMBULATORY_CARE_PROVIDER_SITE_OTHER): Payer: PRIVATE HEALTH INSURANCE

## 2012-03-21 ENCOUNTER — Encounter (HOSPITAL_COMMUNITY): Admission: RE | Admit: 2012-03-21 | Payer: PRIVATE HEALTH INSURANCE | Source: Ambulatory Visit

## 2012-03-21 DIAGNOSIS — Z0389 Encounter for observation for other suspected diseases and conditions ruled out: Secondary | ICD-10-CM

## 2012-03-21 DIAGNOSIS — Z0000 Encounter for general adult medical examination without abnormal findings: Secondary | ICD-10-CM

## 2012-03-21 LAB — LIPID PANEL
Cholesterol: 107 mg/dL (ref 0–200)
LDL Cholesterol: 53 mg/dL (ref 0–99)
Triglycerides: 82 mg/dL (ref 0.0–149.0)

## 2012-03-21 LAB — PSA: PSA: 0.52 ng/mL (ref 0.10–4.00)

## 2012-03-21 LAB — BASIC METABOLIC PANEL
Calcium: 8.8 mg/dL (ref 8.4–10.5)
Chloride: 102 mEq/L (ref 96–112)
Creatinine, Ser: 0.9 mg/dL (ref 0.4–1.5)
GFR: 91.67 mL/min (ref >60.00)

## 2012-03-21 LAB — CBC WITH DIFFERENTIAL/PLATELET
Basophils Absolute: 0.1 10*3/uL (ref 0.0–0.1)
Eosinophils Absolute: 0.2 10*3/uL (ref 0.0–0.7)
Hemoglobin: 15.5 g/dL (ref 13.0–17.0)
Lymphocytes Relative: 23 % (ref 12.0–46.0)
MCHC: 33.7 g/dL (ref 30.0–36.0)
MCV: 91.9 fl (ref 78.0–100.0)
Monocytes Absolute: 0.7 10*3/uL (ref 0.1–1.0)
Neutro Abs: 6.3 10*3/uL (ref 1.4–7.7)
RDW: 13.7 % (ref 11.5–14.6)

## 2012-03-21 LAB — URINALYSIS, ROUTINE W REFLEX MICROSCOPIC
Ketones, ur: NEGATIVE
Specific Gravity, Urine: >=1.03 (ref 1.000–1.030)
Total Protein, Urine: NEGATIVE
Urine Glucose: NEGATIVE
pH: 6 (ref 5.0–8.0)

## 2012-03-21 LAB — LDL CHOLESTEROL, DIRECT: Direct LDL: 57.9 mg/dL

## 2012-03-21 LAB — HEPATIC FUNCTION PANEL
Albumin: 3.5 g/dL (ref 3.5–5.2)
Total Bilirubin: 0.6 mg/dL (ref 0.3–1.2)

## 2012-03-25 ENCOUNTER — Encounter: Payer: Self-pay | Admitting: Endocrinology

## 2012-03-25 ENCOUNTER — Ambulatory Visit (INDEPENDENT_AMBULATORY_CARE_PROVIDER_SITE_OTHER): Payer: PRIVATE HEALTH INSURANCE | Admitting: Endocrinology

## 2012-03-25 VITALS — BP 142/80 | HR 104 | Temp 98.2°F | Wt 363.0 lb

## 2012-03-25 DIAGNOSIS — L408 Other psoriasis: Secondary | ICD-10-CM

## 2012-03-25 DIAGNOSIS — L409 Psoriasis, unspecified: Secondary | ICD-10-CM

## 2012-03-25 DIAGNOSIS — Z23 Encounter for immunization: Secondary | ICD-10-CM

## 2012-03-25 DIAGNOSIS — G4733 Obstructive sleep apnea (adult) (pediatric): Secondary | ICD-10-CM

## 2012-03-25 DIAGNOSIS — Z0000 Encounter for general adult medical examination without abnormal findings: Secondary | ICD-10-CM

## 2012-03-25 NOTE — Progress Notes (Signed)
  Subjective:    Patient ID: [name], male    DOB: [birth year], 56 y.o.   MRN: [id number]  HPI    Review of Systems  Constitutional: Positive for unexpected weight change. Negative for fever.  HENT: Negative for hearing loss.   Eyes: Negative for visual disturbance.  Respiratory:       Doe  Cardiovascular: Negative for chest pain.  Gastrointestinal: Negative for anal bleeding.  Genitourinary: Negative for hematuria and difficulty urinating.  Musculoskeletal: Positive for back pain.  Skin: Negative for rash.  Neurological: Negative for syncope and numbness.  Psychiatric/Behavioral: Negative for dysphoric mood.       Objective:   Physical Exam VS: see vs page GEN: no distress HEAD: head: no deformity eyes: no periorbital swelling, no proptosis external nose and ears are normal mouth: no lesion seen NECK: supple, thyroid is not enlarged CHEST WALL: no deformity LUNGS: clear to auscultation BREASTS:  pseudogynecomastia CV: reg rate and rhythm, no murmur ABD: abdomen is soft, nontender.  no hepatosplenomegaly.  not distended.  no hernia RECTAL: normal external and internal exam.  heme neg. PROSTATE:  Normal size.  No nodule MUSCULOSKELETAL: muscle bulk and strength are grossly normal.  no obvious joint swelling.  gait is normal and steady EXTEMITIES: no deformity.  no ulcer on the feet.  feet are of normal color and temp.  no edema PULSES: dorsalis pedis intact bilat.  no carotid bruit NEURO:  cn 2-12 grossly intact.   readily moves all 4's.  sensation is intact to touch on the feet SKIN:  Normal texture and temperature.  No rash or suspicious lesion is visible.   NODES:  None palpable at the neck PSYCH: alert, oriented x3.  Does not appear anxious nor depressed.       Assessment & Plan:

## 2012-03-25 NOTE — Patient Instructions (Addendum)
Refer to a weight-loss surgery specialist.  you will receive a phone call, about a day and time for an appointment please consider these measures for your health:  minimize alcohol.  do not use tobacco products.  have a colonoscopy at least every 10 years from age 55.  keep firearms safely stored.  always use seat belts.  have working smoke alarms in your home.  see an eye doctor and dentist regularly.  never drive under the influence of alcohol or drugs (including prescription drugs).  those with fair skin should take precautions against the sun.  Please return in 1 year.

## 2012-03-27 ENCOUNTER — Other Ambulatory Visit: Payer: Self-pay | Admitting: Endocrinology

## 2012-03-28 ENCOUNTER — Encounter (HOSPITAL_COMMUNITY): Payer: Self-pay | Admitting: Pharmacy Technician

## 2012-03-28 ENCOUNTER — Encounter: Payer: PRIVATE HEALTH INSURANCE | Admitting: Endocrinology

## 2012-04-06 ENCOUNTER — Ambulatory Visit (HOSPITAL_COMMUNITY)
Admission: RE | Admit: 2012-04-06 | Discharge: 2012-04-06 | Disposition: A | Discharge status: Home / Self Care | Payer: PRIVATE HEALTH INSURANCE | Source: Ambulatory Visit | Attending: Orthopedic Surgery | Admitting: Orthopedic Surgery

## 2012-04-06 ENCOUNTER — Encounter (HOSPITAL_COMMUNITY): Payer: Self-pay

## 2012-04-06 ENCOUNTER — Encounter (HOSPITAL_COMMUNITY)
Admission: RE | Admit: 2012-04-06 | Discharge: 2012-04-06 | Disposition: A | Discharge status: Home / Self Care | Payer: PRIVATE HEALTH INSURANCE | Source: Ambulatory Visit | Attending: Orthopedic Surgery | Admitting: Orthopedic Surgery

## 2012-04-06 DIAGNOSIS — Z01811 Encounter for preprocedural respiratory examination: Secondary | ICD-10-CM | POA: Insufficient documentation

## 2012-04-06 DIAGNOSIS — J4489 Other specified chronic obstructive pulmonary disease: Secondary | ICD-10-CM | POA: Insufficient documentation

## 2012-04-06 DIAGNOSIS — J449 Chronic obstructive pulmonary disease, unspecified: Secondary | ICD-10-CM | POA: Insufficient documentation

## 2012-04-06 HISTORY — DX: Personal history of other medical treatment: Z92.89

## 2012-04-06 HISTORY — DX: Type 2 diabetes mellitus without complications: E11.9

## 2012-04-06 HISTORY — DX: Unspecified osteoarthritis, unspecified site: M19.90

## 2012-04-06 HISTORY — DX: Shortness of breath: R06.02

## 2012-04-06 HISTORY — DX: Sleep apnea, unspecified: G47.30

## 2012-04-06 HISTORY — DX: Personal history of other diseases of the digestive system: Z87.19

## 2012-04-06 LAB — URINALYSIS, ROUTINE W REFLEX MICROSCOPIC
Bilirubin Urine: NEGATIVE
Ketones, ur: NEGATIVE mg/dL
Leukocytes, UA: NEGATIVE
Nitrite: NEGATIVE
Protein, ur: NEGATIVE mg/dL
Urobilinogen, UA: 0.2 mg/dL (ref 0.0–1.0)

## 2012-04-06 LAB — TYPE AND SCREEN: Antibody Screen: NEGATIVE

## 2012-04-06 LAB — CBC WITH DIFFERENTIAL/PLATELET
Basophils Absolute: 0 10*3/uL (ref 0.0–0.1)
Basophils Relative: 0 % (ref 0–1)
Eosinophils Absolute: 0.2 10*3/uL (ref 0.0–0.7)
Hemoglobin: 16.3 g/dL (ref 13.0–17.0)
MCH: 31.5 pg (ref 26.0–34.0)
MCHC: 35.4 g/dL (ref 30.0–36.0)
Monocytes Absolute: 0.7 10*3/uL (ref 0.1–1.0)
Monocytes Relative: 6 % (ref 3–12)
Neutro Abs: 7.4 10*3/uL (ref 1.7–7.7)
Neutrophils Relative %: 68 % (ref 43–77)
RDW: 13 % (ref 11.5–15.5)

## 2012-04-06 LAB — PROTIME-INR
INR: 1.03 (ref 0.00–1.49)
Prothrombin Time: 13.4 seconds (ref 11.6–15.2)

## 2012-04-06 LAB — COMPREHENSIVE METABOLIC PANEL
AST: 17 U/L (ref 0–37)
Albumin: 3.9 g/dL (ref 3.5–5.2)
BUN: 8 mg/dL (ref 6–23)
Creatinine, Ser: 0.83 mg/dL (ref 0.50–1.35)
Potassium: 4 mEq/L (ref 3.5–5.1)
Total Protein: 7.4 g/dL (ref 6.0–8.3)

## 2012-04-06 LAB — URINE MICROSCOPIC-ADD ON

## 2012-04-06 LAB — APTT: aPTT: 30 seconds (ref 24–37)

## 2012-04-06 NOTE — Progress Notes (Signed)
Spoke with AMaureen Chatters, reported pt. Weight & use of CPAP daily.  Will leave chart for further review /w A. Zelenak,PAC

## 2012-04-06 NOTE — Pre-Procedure Instructions (Signed)
[address]ENING  04/06/2012   Your procedure is scheduled on:  04/11/2012  Report to [facility] at 10:30 AM.  Call this number if you have problems the morning of surgery: [phone]   Remember:   Do not eat food or drink liquid :After Midnight. SUNDAY      Take these medicines the morning of surgery with A SIP OF WATER: pain medicine, flexeril if needed is OK   Do not wear jewelry  Do not wear lotions, powders, or perfumes. You may wear deodorant.   Men may shave face and neck.  Do not bring valuables to the hospital.  Contacts, dentures or bridgework may not be worn into surgery.  Leave suitcase in the car. After surgery it may be brought to your room.  For patients admitted to the hospital, checkout time is 11:00 AM the day of discharge.   Patients discharged the day of surgery will not be allowed to drive home.  Name and phone number of your driver: with wife  Special Instructions: Shower using CHG 2 nights before surgery and the night before surgery.  If you shower the day of surgery use CHG.  Use special wash - you have one bottle of CHG for all showers.  You should use approximately 1/3 of the bottle for each shower.   Please read over the following fact sheets that you were given: Pain Booklet, Coughing and Deep Breathing, Blood Transfusion Information, MRSA Information and Surgical Site Infection Prevention

## 2012-04-07 NOTE — Consult Note (Signed)
Anesthesia chart review: Patient is a 56 year old male scheduled for left shoulder hemiarthroplasty for avascular necrosis by Dr. Luiz Blare on 04/11/2012. History includes smoking, morbid obesity (BMI 46), HLD, asthma, OSA with CPAP use (Dr. Shelle Iron), "borderline" DM2, hiatal hernia, HTN, arthritis, ED.  PCP is listed as Dr. Romero Belling.  He cleared patient for surgery (see 02/16/12 note under Letters tab).  EKG on 12/21/2011 showed sinus rhythm with occasional PACs.  Echo on 02/16/12 showed: Left ventricle: The cavity size was mildly dilated. Wall thickness was normal. Systolic function was normal. The estimated ejection fraction was in the range of 55% to 60%. Features are consistent with a pseudonormal left ventricular filling pattern, with concomitant abnormal relaxation and increased filling pressure (grade 2 diastolic dysfunction). - Left atrium: The atrium was mildly dilated. Impressions: - Extremely limited due to poor sound wave transmission; LV function appears to be preserved; focal wall motion abnormality cannot be excluded; suggest MUGA or cardiac MRI if clinically indicated.  His last stress test was > 10 years ago.  Chest x-ray from 04/06/2012 showed changes of COPD, probable chronic bronchitis, no active lung disease.   PFTs on 02/09/12 showed an FEV1 2.45 (56%).    Labs noted.  Pre-operative work-up by Dr. [name] includes PFTs and echocardiogram.  He has since cleared patient for this procedure.  He is morbidly obese with OSA/CPAP history.  He will be evaluated by his anesthesiologist on the day of surgery to discuss the definitive anesthesia plan.  Shonna Chock, PA-C

## 2012-04-10 MED ORDER — CEFAZOLIN SODIUM-DEXTROSE 2-3 GM-% IV SOLR: 2.0000 g | INTRAVENOUS | Status: DC

## 2012-04-10 MED ORDER — DEXTROSE 5 % IV SOLN
3.0000 g | INTRAVENOUS | Status: AC
  Administered 2012-04-11: 3 g via INTRAVENOUS
  Filled 2012-04-10: qty 3000

## 2012-04-10 MED ORDER — POVIDONE-IODINE 7.5 % EX SOLN
Freq: Once | CUTANEOUS | Status: DC
  Filled 2012-04-10: qty 118

## 2012-04-11 ENCOUNTER — Encounter (HOSPITAL_COMMUNITY): Payer: Self-pay | Admitting: Vascular Surgery

## 2012-04-11 ENCOUNTER — Inpatient Hospital Stay (HOSPITAL_COMMUNITY)
Admission: RE | Admit: 2012-04-11 | Discharge: 2012-04-12 | DRG: 483 | Disposition: A | Discharge status: Home / Self Care | Payer: Worker's Compensation | Source: Ambulatory Visit | Attending: Orthopedic Surgery | Admitting: Orthopedic Surgery

## 2012-04-11 ENCOUNTER — Inpatient Hospital Stay (HOSPITAL_COMMUNITY): Payer: Worker's Compensation

## 2012-04-11 ENCOUNTER — Inpatient Hospital Stay (HOSPITAL_COMMUNITY): Payer: Worker's Compensation | Admitting: Vascular Surgery

## 2012-04-11 ENCOUNTER — Encounter (HOSPITAL_COMMUNITY)
Admission: RE | Disposition: A | Discharge status: Home / Self Care | Payer: Self-pay | Source: Ambulatory Visit | Attending: Orthopedic Surgery

## 2012-04-11 ENCOUNTER — Encounter (HOSPITAL_COMMUNITY): Payer: Self-pay | Admitting: *Deleted

## 2012-04-11 DIAGNOSIS — E119 Type 2 diabetes mellitus without complications: Secondary | ICD-10-CM | POA: Diagnosis present

## 2012-04-11 DIAGNOSIS — T8489XA Other specified complication of internal orthopedic prosthetic devices, implants and grafts, initial encounter: Secondary | ICD-10-CM | POA: Diagnosis present

## 2012-04-11 DIAGNOSIS — Y831 Surgical operation with implant of artificial internal device as the cause of abnormal reaction of the patient, or of later complication, without mention of misadventure at the time of the procedure: Secondary | ICD-10-CM | POA: Diagnosis present

## 2012-04-11 DIAGNOSIS — E785 Hyperlipidemia, unspecified: Secondary | ICD-10-CM | POA: Diagnosis present

## 2012-04-11 DIAGNOSIS — Z7982 Long term (current) use of aspirin: Secondary | ICD-10-CM

## 2012-04-11 DIAGNOSIS — I10 Essential (primary) hypertension: Secondary | ICD-10-CM | POA: Diagnosis present

## 2012-04-11 DIAGNOSIS — M87029 Idiopathic aseptic necrosis of unspecified humerus: Principal | ICD-10-CM | POA: Diagnosis present

## 2012-04-11 DIAGNOSIS — Y92009 Unspecified place in unspecified non-institutional (private) residence as the place of occurrence of the external cause: Secondary | ICD-10-CM

## 2012-04-11 DIAGNOSIS — M25512 Pain in left shoulder: Secondary | ICD-10-CM

## 2012-04-11 DIAGNOSIS — F17200 Nicotine dependence, unspecified, uncomplicated: Secondary | ICD-10-CM | POA: Diagnosis present

## 2012-04-11 DIAGNOSIS — J45909 Unspecified asthma, uncomplicated: Secondary | ICD-10-CM | POA: Diagnosis present

## 2012-04-11 DIAGNOSIS — M1710 Unilateral primary osteoarthritis, unspecified knee: Secondary | ICD-10-CM | POA: Diagnosis present

## 2012-04-11 DIAGNOSIS — M19019 Primary osteoarthritis, unspecified shoulder: Secondary | ICD-10-CM | POA: Diagnosis present

## 2012-04-11 DIAGNOSIS — G4730 Sleep apnea, unspecified: Secondary | ICD-10-CM | POA: Diagnosis present

## 2012-04-11 DIAGNOSIS — J309 Allergic rhinitis, unspecified: Secondary | ICD-10-CM | POA: Diagnosis present

## 2012-04-11 HISTORY — PX: SHOULDER HEMI-ARTHROPLASTY: SHX5049

## 2012-04-11 LAB — GLUCOSE, CAPILLARY: Glucose-Capillary: 93 mg/dL (ref 70–99)

## 2012-04-11 SURGERY — HEMIARTHROPLASTY, SHOULDER
Anesthesia: Regional | Site: Shoulder | Laterality: Left | Wound class: Clean

## 2012-04-11 MED ORDER — MENTHOL 3 MG MT LOZG: 1.0000 | LOZENGE | OROMUCOSAL | Status: DC | PRN

## 2012-04-11 MED ORDER — FENTANYL CITRATE 0.05 MG/ML IJ SOLN
INTRAMUSCULAR | Status: AC
  Filled 2012-04-11: qty 2

## 2012-04-11 MED ORDER — BUPIVACAINE-EPINEPHRINE PF 0.5-1:200000 % IJ SOLN
INTRAMUSCULAR | Status: DC | PRN
  Administered 2012-04-11: 30 mL

## 2012-04-11 MED ORDER — BISACODYL 5 MG PO TBEC: 5.0000 mg | DELAYED_RELEASE_TABLET | Freq: Every day | ORAL | Status: DC | PRN

## 2012-04-11 MED ORDER — ONDANSETRON HCL 4 MG/2ML IJ SOLN: 4.0000 mg | Freq: Once | INTRAMUSCULAR | Status: DC | PRN

## 2012-04-11 MED ORDER — DIPHENHYDRAMINE HCL 12.5 MG/5ML PO ELIX: 12.5000 mg | ORAL_SOLUTION | ORAL | Status: DC | PRN

## 2012-04-11 MED ORDER — OXYCODONE HCL 5 MG PO TABS: 5.0000 mg | ORAL_TABLET | Freq: Once | ORAL | Status: DC | PRN

## 2012-04-11 MED ORDER — ONDANSETRON HCL 4 MG/2ML IJ SOLN
INTRAMUSCULAR | Status: DC | PRN
  Administered 2012-04-11: 4 mg via INTRAVENOUS

## 2012-04-11 MED ORDER — FENTANYL CITRATE 0.05 MG/ML IJ SOLN
50.0000 ug | INTRAMUSCULAR | Status: DC | PRN
  Administered 2012-04-11: 50 ug via INTRAVENOUS

## 2012-04-11 MED ORDER — SODIUM CHLORIDE 0.9 % IR SOLN
Status: DC | PRN
  Administered 2012-04-11: 1000 mL

## 2012-04-11 MED ORDER — HYDROCHLOROTHIAZIDE 25 MG PO TABS
25.0000 mg | ORAL_TABLET | Freq: Every day | ORAL | Status: DC
  Administered 2012-04-11: 25 mg via ORAL
  Filled 2012-04-11 (×2): qty 1

## 2012-04-11 MED ORDER — MIDAZOLAM HCL 2 MG/2ML IJ SOLN
INTRAMUSCULAR | Status: AC
  Filled 2012-04-11: qty 2

## 2012-04-11 MED ORDER — ONDANSETRON HCL 4 MG/2ML IJ SOLN: 4.0000 mg | Freq: Four times a day (QID) | INTRAMUSCULAR | Status: DC | PRN

## 2012-04-11 MED ORDER — PHENOL 1.4 % MT LIQD: 1.0000 | OROMUCOSAL | Status: DC | PRN

## 2012-04-11 MED ORDER — LACTATED RINGERS IV SOLN
INTRAVENOUS | Status: DC
  Administered 2012-04-11: 12:00:00 via INTRAVENOUS

## 2012-04-11 MED ORDER — SUCCINYLCHOLINE CHLORIDE 20 MG/ML IJ SOLN
INTRAMUSCULAR | Status: DC | PRN
  Administered 2012-04-11: 120 mg via INTRAVENOUS

## 2012-04-11 MED ORDER — FENTANYL CITRATE 0.05 MG/ML IJ SOLN
INTRAMUSCULAR | Status: DC | PRN
  Administered 2012-04-11: 150 ug via INTRAVENOUS
  Administered 2012-04-11 (×2): 25 ug via INTRAVENOUS
  Administered 2012-04-11 (×2): 50 ug via INTRAVENOUS

## 2012-04-11 MED ORDER — MIDAZOLAM HCL 2 MG/2ML IJ SOLN
1.0000 mg | INTRAMUSCULAR | Status: DC | PRN
  Administered 2012-04-11: 2 mg via INTRAVENOUS

## 2012-04-11 MED ORDER — PROPOFOL 10 MG/ML IV BOLUS
INTRAVENOUS | Status: DC | PRN
  Administered 2012-04-11: 300 mg via INTRAVENOUS

## 2012-04-11 MED ORDER — EPHEDRINE SULFATE 50 MG/ML IJ SOLN
INTRAMUSCULAR | Status: DC | PRN
  Administered 2012-04-11 (×2): 5 mg via INTRAVENOUS

## 2012-04-11 MED ORDER — METOCLOPRAMIDE HCL 10 MG PO TABS: 5.0000 mg | ORAL_TABLET | Freq: Three times a day (TID) | ORAL | Status: DC | PRN

## 2012-04-11 MED ORDER — HYDROCODONE-ACETAMINOPHEN 5-325 MG PO TABS: 1.0000 | ORAL_TABLET | ORAL | Status: DC | PRN

## 2012-04-11 MED ORDER — LOSARTAN POTASSIUM 50 MG PO TABS
100.0000 mg | ORAL_TABLET | Freq: Every day | ORAL | Status: DC
  Administered 2012-04-11: 100 mg via ORAL
  Filled 2012-04-11 (×2): qty 2

## 2012-04-11 MED ORDER — ALBUTEROL 90 MCG/ACT IN AERS: 2.0000 | INHALATION_SPRAY | RESPIRATORY_TRACT | Status: DC | PRN

## 2012-04-11 MED ORDER — MEPERIDINE HCL 25 MG/ML IJ SOLN: 6.2500 mg | INTRAMUSCULAR | Status: DC | PRN

## 2012-04-11 MED ORDER — ZOLPIDEM TARTRATE 5 MG PO TABS: 5.0000 mg | ORAL_TABLET | Freq: Every evening | ORAL | Status: DC | PRN

## 2012-04-11 MED ORDER — ARTIFICIAL TEARS OP OINT
TOPICAL_OINTMENT | OPHTHALMIC | Status: DC | PRN
  Administered 2012-04-11: 1 via OPHTHALMIC

## 2012-04-11 MED ORDER — HYDROMORPHONE HCL PF 1 MG/ML IJ SOLN
0.2500 mg | INTRAMUSCULAR | Status: DC | PRN
  Administered 2012-04-11: 0.5 mg via INTRAVENOUS

## 2012-04-11 MED ORDER — CEFAZOLIN SODIUM-DEXTROSE 2-3 GM-% IV SOLR
2.0000 g | Freq: Four times a day (QID) | INTRAVENOUS | Status: AC
  Administered 2012-04-11 – 2012-04-12 (×3): 2 g via INTRAVENOUS
  Filled 2012-04-11 (×3): qty 50

## 2012-04-11 MED ORDER — IPRATROPIUM-ALBUTEROL 18-103 MCG/ACT IN AERO
2.0000 | INHALATION_SPRAY | RESPIRATORY_TRACT | Status: DC | PRN
  Filled 2012-04-11: qty 14.7

## 2012-04-11 MED ORDER — HYDROMORPHONE HCL PF 1 MG/ML IJ SOLN
INTRAMUSCULAR | Status: AC
  Filled 2012-04-11: qty 1

## 2012-04-11 MED ORDER — NEOSTIGMINE METHYLSULFATE 1 MG/ML IJ SOLN
INTRAMUSCULAR | Status: DC | PRN
  Administered 2012-04-11: 5 mg via INTRAVENOUS

## 2012-04-11 MED ORDER — METOCLOPRAMIDE HCL 5 MG/ML IJ SOLN: 5.0000 mg | Freq: Three times a day (TID) | INTRAMUSCULAR | Status: DC | PRN

## 2012-04-11 MED ORDER — ONDANSETRON HCL 4 MG PO TABS: 4.0000 mg | ORAL_TABLET | Freq: Four times a day (QID) | ORAL | Status: DC | PRN

## 2012-04-11 MED ORDER — LOSARTAN POTASSIUM-HCTZ 100-25 MG PO TABS: 1.0000 | ORAL_TABLET | Freq: Every day | ORAL | Status: DC

## 2012-04-11 MED ORDER — OXYCODONE HCL 5 MG/5ML PO SOLN: 5.0000 mg | Freq: Once | ORAL | Status: DC | PRN

## 2012-04-11 MED ORDER — ESMOLOL HCL 10 MG/ML IV SOLN
INTRAVENOUS | Status: DC | PRN
  Administered 2012-04-11: 10 mg via INTRAVENOUS
  Administered 2012-04-11 (×2): 20 mg via INTRAVENOUS

## 2012-04-11 MED ORDER — LACTATED RINGERS IV SOLN
INTRAVENOUS | Status: DC | PRN
  Administered 2012-04-11 (×2): via INTRAVENOUS

## 2012-04-11 MED ORDER — PHENYLEPHRINE HCL 10 MG/ML IJ SOLN
INTRAMUSCULAR | Status: DC | PRN
  Administered 2012-04-11: 40 ug via INTRAVENOUS
  Administered 2012-04-11: 80 ug via INTRAVENOUS
  Administered 2012-04-11 (×2): 40 ug via INTRAVENOUS
  Administered 2012-04-11: 80 ug via INTRAVENOUS

## 2012-04-11 MED ORDER — DOCUSATE SODIUM 100 MG PO CAPS
100.0000 mg | ORAL_CAPSULE | Freq: Two times a day (BID) | ORAL | Status: DC
  Administered 2012-04-11 – 2012-04-12 (×2): 100 mg via ORAL
  Filled 2012-04-11 (×3): qty 1

## 2012-04-11 MED ORDER — ALUM & MAG HYDROXIDE-SIMETH 200-200-20 MG/5ML PO SUSP: 30.0000 mL | ORAL | Status: DC | PRN

## 2012-04-11 MED ORDER — SODIUM CHLORIDE 0.9 % IV SOLN
INTRAVENOUS | Status: DC
  Administered 2012-04-11: 1000 mL via INTRAVENOUS

## 2012-04-11 MED ORDER — SIMVASTATIN 20 MG PO TABS
20.0000 mg | ORAL_TABLET | Freq: Every day | ORAL | Status: DC
  Filled 2012-04-11: qty 1

## 2012-04-11 MED ORDER — ROCURONIUM BROMIDE 100 MG/10ML IV SOLN
INTRAVENOUS | Status: DC | PRN
  Administered 2012-04-11 (×2): 10 mg via INTRAVENOUS
  Administered 2012-04-11: 50 mg via INTRAVENOUS
  Administered 2012-04-11: 10 mg via INTRAVENOUS

## 2012-04-11 MED ORDER — ACETAMINOPHEN 325 MG PO TABS: 650.0000 mg | ORAL_TABLET | Freq: Four times a day (QID) | ORAL | Status: DC | PRN

## 2012-04-11 MED ORDER — ACETAMINOPHEN 650 MG RE SUPP: 650.0000 mg | Freq: Four times a day (QID) | RECTAL | Status: DC | PRN

## 2012-04-11 MED ORDER — ASPIRIN EC 325 MG PO TBEC
325.0000 mg | DELAYED_RELEASE_TABLET | Freq: Two times a day (BID) | ORAL | Status: DC
  Administered 2012-04-11 – 2012-04-12 (×2): 325 mg via ORAL
  Filled 2012-04-11 (×3): qty 1

## 2012-04-11 MED ORDER — ALBUTEROL SULFATE HFA 108 (90 BASE) MCG/ACT IN AERS
INHALATION_SPRAY | RESPIRATORY_TRACT | Status: DC | PRN
  Administered 2012-04-11: 2 via RESPIRATORY_TRACT

## 2012-04-11 MED ORDER — MORPHINE SULFATE 2 MG/ML IJ SOLN: 2.0000 mg | INTRAMUSCULAR | Status: DC | PRN

## 2012-04-11 MED ORDER — OXYCODONE-ACETAMINOPHEN 5-325 MG PO TABS
1.0000 | ORAL_TABLET | ORAL | Status: DC | PRN
Start: 2012-04-11 — End: 2012-04-12
  Administered 2012-04-11 – 2012-04-12 (×3): 2 via ORAL
  Filled 2012-04-11 (×3): qty 2

## 2012-04-11 MED ORDER — LIDOCAINE HCL (CARDIAC) 20 MG/ML IV SOLN
INTRAVENOUS | Status: DC | PRN
  Administered 2012-04-11: 100 mg via INTRAVENOUS

## 2012-04-11 MED ORDER — POLYETHYLENE GLYCOL 3350 17 G PO PACK: 17.0000 g | PACK | Freq: Every day | ORAL | Status: DC | PRN

## 2012-04-11 MED ORDER — FLEET ENEMA 7-19 GM/118ML RE ENEM: 1.0000 | ENEMA | Freq: Once | RECTAL | Status: AC | PRN

## 2012-04-11 MED ORDER — GLYCOPYRROLATE 0.2 MG/ML IJ SOLN
INTRAMUSCULAR | Status: DC | PRN
  Administered 2012-04-11: .8 mg via INTRAVENOUS

## 2012-04-11 SURGICAL SUPPLY — 63 items
ASSEMBLY NECK TAPER FIXED 135 (Orthopedic Implant) ×1 IMPLANT
BLADE SAW SAG 73X25 THK (BLADE) ×1
BLADE SAW SGTL 73X25 THK (BLADE) ×1 IMPLANT
BLADE SURG 15 STRL LF DISP TIS (BLADE) ×1 IMPLANT
BLADE SURG 15 STRL SS (BLADE) ×2
BOWL SMART MIX CTS (DISPOSABLE) IMPLANT
CHLORAPREP W/TINT 26ML (MISCELLANEOUS) ×2 IMPLANT
CLOTH BEACON ORANGE TIMEOUT ST (SAFETY) ×2 IMPLANT
COVER SURGICAL LIGHT HANDLE (MISCELLANEOUS) ×2 IMPLANT
DRAPE INCISE IOBAN 66X45 STRL (DRAPES) ×2 IMPLANT
DRAPE SURG 17X23 STRL (DRAPES) ×2 IMPLANT
DRAPE U-SHAPE 47X51 STRL (DRAPES) ×2 IMPLANT
DRSG PAD ABDOMINAL 8X10 ST (GAUZE/BANDAGES/DRESSINGS) ×2 IMPLANT
ELECT CAUTERY BLADE 6.4 (BLADE) ×2 IMPLANT
ELECT REM PT RETURN 9FT ADLT (ELECTROSURGICAL) ×2
ELECTRODE REM PT RTRN 9FT ADLT (ELECTROSURGICAL) ×1 IMPLANT
EVACUATOR 1/8 PVC DRAIN (DRAIN) ×2 IMPLANT
GLOVE BIO SURGEON STRL SZ7 (GLOVE) ×2 IMPLANT
GLOVE BIO SURGEON STRL SZ7.5 (GLOVE) ×2 IMPLANT
GLOVE BIOGEL PI IND STRL 8 (GLOVE) ×1 IMPLANT
GLOVE BIOGEL PI INDICATOR 8 (GLOVE) ×1
GOWN PREVENTION PLUS LG XLONG (DISPOSABLE) ×2 IMPLANT
GOWN STRL NON-REIN LRG LVL3 (GOWN DISPOSABLE) ×4 IMPLANT
HANDPIECE INTERPULSE COAX TIP (DISPOSABLE) ×2
HEMOSTAT SURGICEL 2X14 (HEMOSTASIS) IMPLANT
HOOD PEEL AWAY FACE SHEILD DIS (HOOD) ×4 IMPLANT
KIT BASIN OR (CUSTOM PROCEDURE TRAY) ×2 IMPLANT
KIT ROOM TURNOVER OR (KITS) ×2 IMPLANT
MANIFOLD NEPTUNE II (INSTRUMENTS) ×2 IMPLANT
NDL HYPO 25GX1X1/2 BEV (NEEDLE) ×1 IMPLANT
NEEDLE HYPO 25GX1X1/2 BEV (NEEDLE) ×2 IMPLANT
NEEDLE MAYO TROCAR (NEEDLE) ×4 IMPLANT
NS IRRIG 1000ML POUR BTL (IV SOLUTION) ×2 IMPLANT
PACK SHOULDER (CUSTOM PROCEDURE TRAY) ×2 IMPLANT
PAD ARMBOARD 7.5X6 YLW CONV (MISCELLANEOUS) ×4 IMPLANT
RETRIEVER SUT HEWSON (MISCELLANEOUS) ×1 IMPLANT
SET HNDPC FAN SPRY TIP SCT (DISPOSABLE) ×1 IMPLANT
SET PAD SHOULDER ACCESS (MISCELLANEOUS) ×2 IMPLANT
SLING ARM IMMOBILIZER LRG (SOFTGOODS) ×2 IMPLANT
SLING ARM IMMOBILIZER MED (SOFTGOODS) IMPLANT
SPONGE GAUZE 4X4 12PLY (GAUZE/BANDAGES/DRESSINGS) ×2 IMPLANT
SPONGE LAP 18X18 X RAY DECT (DISPOSABLE) ×2 IMPLANT
STEM GLOBAL AP 12MM (Stem) ×1 IMPLANT
STRIP CLOSURE SKIN 1/2X4 (GAUZE/BANDAGES/DRESSINGS) ×2 IMPLANT
SUCTION FRAZIER TIP 10 FR DISP (SUCTIONS) ×2 IMPLANT
SUPPORT WRAP ARM LG (MISCELLANEOUS) ×2 IMPLANT
SUT ETHIBOND NAB CT1 #1 30IN (SUTURE) ×2 IMPLANT
SUT FIBERWIRE #2 38 T-5 BLUE (SUTURE)
SUT MNCRL AB 4-0 PS2 18 (SUTURE) ×2 IMPLANT
SUT SILK 2 0 TIES 17X18 (SUTURE) ×2
SUT SILK 2-0 18XBRD TIE BLK (SUTURE) ×1 IMPLANT
SUT VIC AB 0 CTB1 27 (SUTURE) ×4 IMPLANT
SUT VIC AB 2-0 CT1 27 (SUTURE) ×6
SUT VIC AB 2-0 CT1 TAPERPNT 27 (SUTURE) ×3 IMPLANT
SUTURE FIBERWR #2 38 T-5 BLUE (SUTURE) IMPLANT
SYR CONTROL 10ML LL (SYRINGE) ×2 IMPLANT
TAPE CLOTH SURG 4X10 WHT LF (GAUZE/BANDAGES/DRESSINGS) ×1 IMPLANT
TOWEL OR 17X24 6PK STRL BLUE (TOWEL DISPOSABLE) ×2 IMPLANT
TOWEL OR 17X26 10 PK STRL BLUE (TOWEL DISPOSABLE) ×2 IMPLANT
TRAY FOLEY CATH 14FR (SET/KITS/TRAYS/PACK) IMPLANT
WATER STERILE IRR 1000ML POUR (IV SOLUTION) ×2 IMPLANT
YANKAUER SUCT BULB TIP NO VENT (SUCTIONS) ×2 IMPLANT
global unite eccentric humeral head 52mmx18mm ×1 IMPLANT

## 2012-04-11 NOTE — Progress Notes (Signed)
Call to dr. Katrinka Blazing to report r pupil a 5 while left is a 2 and pt heart rate 126  When prior to surgery he was int he 80s. Dr. Cheral Almas to come see patient.

## 2012-04-11 NOTE — Transfer of Care (Signed)
Immediate Anesthesia Transfer of Care Note  Patient: [name]  Procedure(s) Performed: Procedure(s) (LRB) with comments: SHOULDER HEMI-ARTHROPLASTY (Left) - LEFT SHOULDER HEMIARTHROPLASTY   Patient Location: PACU  Anesthesia Type:General  Level of Consciousness: awake and alert   Airway & Oxygen Therapy: Patient Spontanous Breathing and Patient connected to face mask oxygen  Post-op Assessment: Report given to PACU RN, Post -op Vital signs reviewed and stable and Patient moving all extremities X 4  Post vital signs: Reviewed and stable  Complications: No apparent anesthesia complications

## 2012-04-11 NOTE — H&P (Signed)
[name] is an 56 y.o. male.   Chief Complaint: R shoulder pain /dysfunction  HPI: continued R shoulder pain and dysfunction after ORIF L prox hum fracture with AVN and head collapse.  Failed conservative treatment.  Past Medical History  Diagnosis Date  . ALLERGIC RHINITIS 03/01/2007  . ASTHMA 03/01/2007  . ERECTILE DYSFUNCTION 03/01/2007  . HYPERGLYCEMIA 03/01/2007  . HYPERLIPIDEMIA 03/01/2007  . HYPERTENSION 03/01/2007  . SHINGLES 03/01/2007  . Hx of echocardiogram     done by [place]   . Shortness of breath   . Sleep apnea     sees Dr. Shelle Iron - sleep study- 06/2011, uses CPAP q night   . Diabetes mellitus without complication     , borderline , per pt. , per Dr. [name]  . H/O hiatal hernia   . Arthritis     avascular necrosis - of L shoulder, knees- painful at time s    Past Surgical History  Procedure Date  . Stress cardiolite 11/01/2001  . Shoulder surgery 2005    Left  . Fx humerus     Left- 2005, required 2 surgeries     Family History  Problem Relation Age of Onset  . Cancer Neg Hx    Social History:  reports that he has been smoking Cigarettes.  He has a 62 pack-year smoking history. He has quit using smokeless tobacco. He reports that he does not drink alcohol or use illicit drugs.  Allergies: No Known Allergies  Medications Prior to Admission  Medication Sig Dispense Refill  . cyclobenzaprine (FLEXERIL) 10 MG tablet Take 10 mg by mouth 2 (two) times daily as needed. Muscle spasm.      . diphenhydrAMINE (BENADRYL) 25 MG tablet Take 25 mg by mouth every 6 (six) hours as needed.      Marland Kitchen HYDROcodone-acetaminophen (NORCO) 10-325 MG per tablet Take 1 tablet by mouth every 8 (eight) hours as needed. Pain      . losartan-hydrochlorothiazide (HYZAAR) 100-25 MG per tablet Take 1 tablet by mouth daily with supper.       . lovastatin (MEVACOR) 40 MG tablet Take 80 mg by mouth at bedtime.      Marland Kitchen albuterol (PROVENTIL,VENTOLIN) 90 MCG/ACT inhaler Inhale 2 puffs into the  lungs as needed. For shortness of breath      . aspirin 81 MG tablet Take 81 mg by mouth daily.         Results for orders placed during the hospital encounter of 04/11/12 (from the past 48 hour(s))  GLUCOSE, CAPILLARY     Status: Normal   Collection Time   04/11/12 10:54 AM      Component Value Range Comment   Glucose-Capillary 93  70 - 99 mg/dL    No results found.  Review of Systems  All other systems reviewed and are negative.    Blood pressure 131/86, pulse 79, temperature 98.1 F (36.7 C), temperature source Oral, resp. rate 17, SpO2 100.00%. Physical Exam  Constitutional: He is oriented to person, place, and time. He appears well-developed and well-nourished.  HENT:  Head: Atraumatic.  Eyes: EOM are normal.  Cardiovascular: Intact distal pulses.   Respiratory: Effort normal.  Musculoskeletal:       Left shoulder: He exhibits decreased range of motion and pain. He exhibits no swelling.  Neurological: He is alert and oriented to person, place, and time.  Skin: Skin is warm and dry.  Psychiatric: He has a normal mood and affect.     Assessment/Plan  R shoulder pain and dysfunction after ORIF L prox hum fracture with AVN and head collapse.  Failed conservative treatment. Plan R removal of implant and hemiarthoplasty Risks / benefits of surgery discussed Consent on chart  NPO for OR Preop antibiotics  Loribeth Katich WILLIAM 04/11/2012, 12:21 PM

## 2012-04-11 NOTE — Anesthesia Postprocedure Evaluation (Signed)
  Anesthesia Post-op Note  Patient: [name]  Procedure(s) Performed: Procedure(s) (LRB) with comments: SHOULDER HEMI-ARTHROPLASTY (Left) - LEFT SHOULDER HEMIARTHROPLASTY   Patient Location: PACU  Anesthesia Type:GA combined with regional for post-op pain  Level of Consciousness: awake, alert , oriented and patient cooperative  Airway and Oxygen Therapy: Patient Spontanous Breathing and Patient connected to nasal cannula oxygen  Post-op Pain: none  Post-op Assessment: Post-op Vital signs reviewed, Patient's Cardiovascular Status Stable, Respiratory Function Stable, Patent Airway, No signs of Nausea or vomiting and Pain level controlled  Post-op Vital Signs: stable  Complications: No apparent anesthesia complications

## 2012-04-11 NOTE — Progress Notes (Signed)
RT Note: Placed pt on Auto CPAP with 2L 02 bleed in and pt own mask. Pt tolerating well. RT and RN will continue to monitor.

## 2012-04-11 NOTE — Anesthesia Procedure Notes (Addendum)
Anesthesia Regional Block:  Interscalene brachial plexus block  Pre-Anesthetic Checklist: ,, timeout performed, Correct Patient, Correct Site, Correct Laterality, Correct Procedure, Correct Position, site marked, Risks and benefits discussed,  Surgical consent,  Pre-op evaluation,  At surgeon's request and post-op pain management  Laterality: Left  Prep: chloraprep       Needles:  Injection technique: Single-shot  Needle Type: Echogenic Stimulator Needle     Needle Length: 9cm      Additional Needles:  Procedures: ultrasound guided (picture in chart) and nerve stimulator Interscalene brachial plexus block  Nerve Stimulator or Paresthesia:  Response: 0.4 mA,   Additional Responses:   Narrative:  Start time: 04/11/2012 12:00 PM End time: 04/11/2012 12:15 PM Injection made incrementally with aspirations every 5 mL.  Performed by: Personally  Anesthesiologist: Arta Bruce MD  Additional Notes: Monitors applied. Patient sedated. Sterile prep and drape,hand hygiene and sterile gloves were used. Relevant anatomy identified.Needle position confirmed.Local anesthetic injected incrementally after negative aspiration. Local anesthetic spread visualized around nerve(s). Vascular puncture avoided. No complications. Image printed for medical record.The patient tolerated the procedure well.       Interscalene brachial plexus block Procedure Name: Intubation Date/Time: 04/11/2012 1:05 PM Performed by: Rogelia Boga Pre-anesthesia Checklist: Patient identified, Emergency Drugs available, Suction available, Patient being monitored and Timeout performed Patient Re-evaluated:Patient Re-evaluated prior to inductionOxygen Delivery Method: Circle system utilized Preoxygenation: Pre-oxygenation with 100% oxygen Intubation Type: IV induction Ventilation: Oral airway inserted - appropriate to patient size and Mask ventilation without difficulty Laryngoscope Size: Mac and 4 Grade View:  Grade II Tube size: 7.5 mm Number of attempts: 3 Airway Equipment and Method: Stylet Placement Confirmation: ETT inserted through vocal cords under direct vision,  positive ETCO2 and breath sounds checked- equal and bilateral Secured at: 23 cm Tube secured with: Tape Dental Injury: Teeth and Oropharynx as per pre-operative assessment and Bloody posterior oropharynx  Comments: DL X 2 by EMT student, DL X1 By CRNA

## 2012-04-11 NOTE — Op Note (Signed)
Procedure(s): SHOULDER HEMI-ARTHROPLASTY Procedure Note  [name] male 56 y.o. 04/11/2012  Procedure(s) and Anesthesia Type: #1 left shoulder hemiarthroplasty for avascular necrosis #2 left shoulder removal implant deep, complex for retained intramedullary implant  Surgeon(s) and Role:    * [name], MD - Primary    * Harvie Junior, MD - Assisting   Indications:  56 y.o. male  With endstage left shoulder avascular necrosis after prior ORIF proximal humerus fracture. Pain and dysfunction interfered with quality of life and nonoperative treatment with activity modification, NSAIDS and injections failed.     Surgeon: [name]   Assistants: Harvie Junior M.D. and Damita Lack PA-C  Anesthesia: General endotracheal anesthesia with preoperative interscalene block    Procedure Detail  Findings: The intramedullary nail and 2 distal interlocking screws were removed. Intramedullary nail had to be removed through a longitudinal split in the supraspinatus. The remainder the supraspinatus was intact. The humeral head was completely flat with significant osteophyte formation. The glenoid was examined and found to be mostly intact. There is significant thinning of the cartilage but no bone loss. The humeral head was replaced with a DePuy por coat press-fit stem with a size 12 and a 52 x 18 head. The head was eccentric. A small crack developed proximally in the calcar at the time of broaching. It did not compromise the fixation and did not appear to extend distally. No fixation was felt necessary. Subscapularis was taken down with an osteotomy and repaired at the end. His preoperative external rotation was -15 and postoperatively he can be brought to neutral external rotation.  Estimated Blood Loss:  300 mL         Drains: 1 medium hemovac  Blood Given: none          Specimens: none        Complications:  * No complications entered in OR log *       Disposition: PACU - hemodynamically stable.         Condition: stable    Procedure:   The patient was identified in the preoperative holding area where I personally marked the operative extremity after verifying with the patient and consent. He  was taken to the operating room where He was transferred to the   operative table.  The patient received an interscalene block in   the holding area by the attending anesthesiologist.  General anesthesia was induced   in the operating room without complication.  The patient did receive IV  Ancef prior to the commencement of the procedure.  The patient was   placed in the beach-chair position with the back raised about 30   degrees.  The nonoperative extremity and head and neck were carefully   positioned and padded protecting against neurovascular compromise.  The   left upper extremity was then prepped and draped in the standard sterile   fashion.    The appropriate operative time-out was performed with   Anesthesia, the perioperative staff, as well as myself and we all agreed   that the left side was the correct operative site.  An approximately   15-20 cm incision was made from the tip of the coracoid to the center point of the   humerus at the level of the axilla.  Dissection was carried down sharply   through subcutaneous tissues and cephalic vein was identified and taken   laterally with the deltoid.  The pectoralis major was taken medially.  The  upper 1 cm of the pectoralis major was released from its attachment on   the humerus.  The subdeltoid plane was carefully dissected. There was a significant amount of adhesion in the region of his previous surgery. The conjoined tendon was identified in the coracoacromial ligament was used to trace the subacromial space. This was then connected laterally with the subdeltoid space taking great care to avoid any injury to the undersurface of the deltoid or axillary nerve. Once the subdeltoid  space was completely released of adhesions retractor was placed beneath the deltoid was second retractor medially underneath the conjoined tendon. Distally the screws interlocking the nail  Were identified and exposed using rongeur and osteotome. The screwdriver from the Acumed set was then used to remove the interlocking screws without significant difficulty. The vessels on the inferior aspect of the subscapularis were identified and clamped and coagulated. The long head of the biceps was then identified and traced into the joint opening the rotator interval. The lesser tuberosity was osteotomized with a large curved osteotome and the rotator interval was completely released. With gentle external rotation and great care was unable to release the capsule although a down to about the 6:00 position. He a large osteophytes inferior tenting the capsule. I took great care to protect the axillary nerve during this exposure. Once I was able to externally rotate adequately the joint was dislocated. This exposed the humeral head which was noted to be completely flat and significant bone loss and circumferential osteophyte formation. The osteophytes were carefully removed with rongeur and osteotomes. The rotator cuff was carefully identified superiorly and protected. At this point the cut was marked with a 135 external guide. The resection was made with a saw. Very little bone was removed. At this point it was determined that the nail was not visible in the cut and was likely more lateral on the tuberosity. Therefore the arm was internally rotated and the previous longitudinal split in the rotator cuff was identified by the sutures. The split was opened. The top of the nail was then identified in the greater tuberosity. It was cleaned of excess tissue and the removal device was used to remove the nail without difficulty. The arm was then again externally rotated and the cut was exposed. The glenoid was then exposed briefly  to examine it. The remainder of the long head biceps tendon was excised. The glenoid had some wear of the articular cartilage no significant bone loss. It was felt that the plan to proceed with a hemiarthroplasty was most appropriate.  The intramedullary canal was reamed from 6-12 mm. A 12 mm reamer was felt to be the appropriate size. The box osteotome and then 12 mm broach were used. There is some difficulty in inserting a 12 mm broach. The intramedullary canal proximally was fairly sclerotic. I went back to a 10 reamer, 12 reamer and then back to the 12 broach. With final impaction a small crack developed medially. There was still an excellent press-fit in no apparent propagation of the crack. At this point the trial 52 mm head was placed. A medium offset after reduction was felt to be too tight. The smaller offset 18 mm was then placed with more appropriate soft tissue tension. This was felt to be the best size. The trial implant was removed. 3 Drill holes were made anteriorly and the lesser tuberosity, through which 2 fiber tapes were passed in a horizontal mattress configuration. These were then passed around the implant for final implantation  in the implant was impacted with excellent press fit. The fiber tapes were then used to repair the subscapularis. The subscapularis was released posteriorly anteriorly and superiorly from maximum excursion. The fiber tapes were run through the bone tendon junction medial to the osteotomy and tied and then brought over to a lateral row through bone tunnels. A #2 FiberWire was used to augment the repair. The joint was copiously irrigated prior to subscapularis closure. The rotator interval was then closed with 1 #1 Ethibond in a figure-of-eight fashion. The wound was then copiously irrigated with normal saline. At this point I could bring the arm to neutral external rotation without significant tension on the subscapularis repair but not much beyond. A medium Hemovac  drain was placed out underneath the deltoid distally and the wound was then closed in layers. Sterile dressing was applied and the patient was placed in a sling immobilizer. He was then allowed to awaken from general anesthesia transferred to the stretcher and taken to the recovery room in stable condition.      POSTOPERATIVE PLAN:  He will remain in a sling likely 6 weeks postoperatively to allow healing. We will allow some early pendulum exercises and possibly external rotation to neutral after 2 weeks of immobilization. He will be kept in the hospital one to 2 days for pain control and antibiotics.

## 2012-04-11 NOTE — Anesthesia Preprocedure Evaluation (Addendum)
Anesthesia Evaluation  Patient identified by MRN, date of birth, ID band Patient awake    Reviewed: Allergy & Precautions, H&P , Patient's Chart, lab work & pertinent test results, reviewed documented beta blocker date and time   History of Anesthesia Complications Negative for: history of anesthetic complications  Airway Mallampati: II TM Distance: >3 FB Neck ROM: Full    Dental  (+) Edentulous Upper and Edentulous Lower   Pulmonary shortness of breath and with exertion, asthma , sleep apnea and Continuous Positive Airway Pressure Ventilation , Current Smoker,          Cardiovascular hypertension, Pt. on medications     Neuro/Psych PSYCHIATRIC DISORDERS Anxiety    GI/Hepatic hiatal hernia,   Endo/Other  diabetes  Renal/GU      Musculoskeletal   Abdominal   Peds  Hematology   Anesthesia Other Findings   Reproductive/Obstetrics                         Anesthesia Physical Anesthesia Plan  ASA: III  Anesthesia Plan: General   Post-op Pain Management:    Induction: Intravenous  Airway Management Planned: Oral ETT  Additional Equipment:   Intra-op Plan:   Post-operative Plan: Extubation in OR  Informed Consent: I have reviewed the patients History and Physical, chart, labs and discussed the procedure including the risks, benefits and alternatives for the proposed anesthesia with the patient or authorized representative who has indicated his/her understanding and acceptance.     Plan Discussed with: CRNA, Surgeon and Anesthesiologist  Anesthesia Plan Comments:        Anesthesia Quick Evaluation

## 2012-04-11 NOTE — Preoperative (Signed)
Beta Blockers   Reason not to administer Beta Blockers:Not Applicable 

## 2012-04-12 ENCOUNTER — Encounter (HOSPITAL_COMMUNITY): Payer: Self-pay | Admitting: Orthopedic Surgery

## 2012-04-12 DIAGNOSIS — M25512 Pain in left shoulder: Secondary | ICD-10-CM

## 2012-04-12 LAB — BASIC METABOLIC PANEL
Calcium: 8.5 mg/dL (ref 8.4–10.5)
Creatinine, Ser: 0.73 mg/dL (ref 0.50–1.35)
GFR calc Af Amer: >90 mL/min (ref >90)
GFR calc non Af Amer: >90 mL/min (ref >90)

## 2012-04-12 LAB — CBC
MCH: 30.3 pg (ref 26.0–34.0)
MCV: 90.1 fL (ref 78.0–100.0)
Platelets: 168 10*3/uL (ref 150–400)
RDW: 13.3 % (ref 11.5–15.5)
WBC: 12.3 10*3/uL — ABNORMAL HIGH (ref 4.0–10.5)

## 2012-04-12 MED ORDER — OXYCODONE-ACETAMINOPHEN 5-325 MG PO TABS: 1.0000 | ORAL_TABLET | ORAL | Status: DC | PRN

## 2012-04-12 NOTE — Discharge Summary (Signed)
Patient ID: [name] MRN: [id number] DOB/AGE: [birth year] 55 y.o.  Admit date: 04/11/2012 Discharge date: 04/12/2012  Admission Diagnoses:  Left shoulder pain and dysfunction after ORIF L prox hum fracture with AVN and head collapse.    Discharge Diagnoses:  Same  Past Medical History  Diagnosis Date  . ALLERGIC RHINITIS 03/01/2007  . ASTHMA 03/01/2007  . ERECTILE DYSFUNCTION 03/01/2007  . HYPERGLYCEMIA 03/01/2007  . HYPERLIPIDEMIA 03/01/2007  . HYPERTENSION 03/01/2007  . SHINGLES 03/01/2007  . Hx of echocardiogram     done by [place]   . Shortness of breath   . Sleep apnea     sees Dr. Shelle Iron - sleep study- 06/2011, uses CPAP q night   . Diabetes mellitus without complication     , borderline , per pt. , per Dr. [name]  . H/O hiatal hernia   . Arthritis     avascular necrosis - of L shoulder, knees- painful at time s    Surgeries: Procedure(s): SHOULDER HEMI-ARTHROPLASTY on 04/11/2012   Consultants:    Discharged Condition: Improved  Hospital Course: [name] is an 56 y.o. male who was admitted 04/11/2012 for operative treatment o L shoulder pain and dysfunction after ORIF L prox hum fracture with AVN and head collapse. Failed conservative treatment. Patient has severe unremitting pain that affects sleep, daily activities, and work/hobbies. After pre-op clearance the patient was taken to the operating room on 04/11/2012 and underwent  Procedure(s): SHOULDER HEMI-ARTHROPLASTY.    Patient was given perioperative antibiotics: Anti-infectives     Start     Dose/Rate Route Frequency Ordered Stop   04/11/12 2000   ceFAZolin (ANCEF) IVPB 2 g/50 mL premix        2 g 100 mL/hr over 30 Minutes Intravenous Every 6 hours 04/11/12 1859 04/12/12 1359   04/11/12 0000   ceFAZolin (ANCEF) 3 g in dextrose 5 % 50 mL IVPB        3 g 160 mL/hr over 30 Minutes Intravenous 60 min pre-op 04/10/12 1528 04/11/12 1310   04/10/12 1526   ceFAZolin (ANCEF) IVPB 2 g/50 mL premix   Status:  Discontinued        2 g 100 mL/hr over 30 Minutes Intravenous 60 min pre-op 04/10/12 1526 04/10/12 1527           Patient was given sequential compression devices, early ambulation, and ASA 325mg  BID to prevent DVT.  Patient benefited maximally from hospital stay and there were no complications.    Recent vital signs: Patient Vitals for the past 24 hrs:  BP Temp Temp src Pulse Resp SpO2  04/12/12 0550 119/65 mmHg 98.1 F (36.7 C) Oral 75  18  100 %  04/11/12 2107 128/65 mmHg 98.2 F (36.8 C) Oral 80  18  100 %  04/11/12 1856 121/65 mmHg 97.9 F (36.6 C) - 112  - -  04/11/12 1815 - 97.8 F (36.6 C) - - - -  04/11/12 1800 107/77 mmHg - - - - -  04/11/12 1745 133/76 mmHg - - - - -  04/11/12 1730 133/54 mmHg - - - - -  04/11/12 1715 139/8 mmHg 98 F (36.7 C) - 126  27  100 %  04/11/12 1203 - - - 79  17  100 %  04/11/12 1150 - - - 83  18  100 %  04/11/12 1040 131/86 mmHg 98.1 F (36.7 C) Oral 84  18  96 %     Recent laboratory studies:  [facility] 04/12/12  0505  WBC 12.3*  HGB 13.5  HCT 40.1  PLT 168  NA 130*  K 3.8  CL 96  CO2 22  BUN 12  CREATININE 0.73  GLUCOSE 116*  INR --  CALCIUM 8.5     Discharge Medications:     Medication List     As of 04/12/2012  7:36 AM    STOP taking these medications         cyclobenzaprine 10 MG tablet   Commonly known as: FLEXERIL      HYDROcodone-acetaminophen 10-325 MG per tablet   Commonly known as: NORCO      TAKE these medications         albuterol 90 MCG/ACT inhaler   Commonly known as: PROVENTIL,VENTOLIN   Inhale 2 puffs into the lungs as needed. For shortness of breath      aspirin 81 MG tablet   Take 81 mg by mouth daily.      diphenhydrAMINE 25 MG tablet   Commonly known as: BENADRYL   Take 25 mg by mouth every 6 (six) hours as needed.      losartan-hydrochlorothiazide 100-25 MG per tablet   Commonly known as: HYZAAR   Take 1 tablet by mouth daily with supper.      lovastatin 40 MG tablet     Commonly known as: MEVACOR   Take 80 mg by mouth at bedtime.      oxyCODONE-acetaminophen 5-325 MG per tablet   Commonly known as: PERCOCET/ROXICET   Take 1-2 tablets by mouth every 4 (four) hours as needed for pain.        Diagnostic Studies: Dg Chest 2 View  04/06/2012  *RADIOLOGY REPORT*  Clinical Data: Preop for left shoulder surgery, chronic bronchitis  CHEST - 2 VIEW  Comparison: Chest x-ray of 02/01/2012  Findings: The lungs are clear but hyperaerated consistent with COPD.  Mild peribronchial thickening is noted indicative of bronchitis, most likely chronic.  Mediastinal contours are stable. The heart is mildly enlarged and stable.  No bony abnormality is seen.  IMPRESSION: Stable chest x-ray with changes of COPD and probable chronic bronchitis.  No active lung disease.   Original Report Authenticated By: Dwyane Dee, M.D.    Dg Shoulder Left Port  04/11/2012  *RADIOLOGY REPORT*  Clinical Data: Hemiarthroplasty  PORTABLE LEFT SHOULDER - 2+ VIEW  Comparison: None.  Findings: Left shoulder humeral Hemiarthroplasty component projects in expected location on this single projection.  Negative for fracture or other apparent complication.  IMPRESSION:  Left shoulder hemiarthroplasty without apparent complication.   Original Report Authenticated By: D. Andria Rhein, MD     Disposition: 01-Home or Self Care      Discharge Orders    Future Orders Please Complete By Expires   Diet - low sodium heart healthy      Call MD / Call 911      Comments:   If you experience chest pain or shortness of breath, CALL 911 and be transported to the hospital emergency room.  If you develope a fever above 101 F, pus (white drainage) or increased drainage or redness at the wound, or calf pain, call your surgeon's office.   Constipation Prevention      Comments:   Drink plenty of fluids.  Prune juice may be helpful.  You may use a stool softener, such as Colace (over the counter) 100 mg twice a day.  Use  MiraLax (over the counter) for constipation as needed.   Increase activity slowly as  tolerated      Driving restrictions      Comments:   No driving until cleared by physician.   Lifting restrictions      Comments:   No lifting until cleared by physician.      Follow-up Information    Follow up with [name], MD. Schedule an appointment as soon as possible for a visit in 2 weeks.   Contact information:   [address] [place] [address] [phone]           Signed: Jiles Harold 04/12/2012, 7:36 AM

## 2012-04-12 NOTE — Progress Notes (Signed)
PATIENT ID: [name]   1 Day Post-Op Procedure(s) (LRB): SHOULDER HEMI-ARTHROPLASTY (Left)  Subjective: Doing well. Pain is well-controlled with Percocet. No distal numbness or tingling  Objective:  Filed Vitals:   04/12/12 0550  BP: 119/65  Pulse: 75  Temp: 98.1 F (36.7 C)  Resp: 18     Examination of the left upper extremity shows the dressing to be clean dry and intact. The drain was removed. He is activating his deltoid as well as biceps. Distally he has intact hand intrinsics, grip, EPL. He has normal sensation to light touch M./U/R.  Labs:   [facility] 04/12/12 0505  HGB 13.5   Basename 04/12/12 0505  WBC 12.3*  RBC 4.45  HCT 40.1  PLT 168   Basename 04/12/12 0505  NA 130*  K 3.8  CL 96  CO2 22  BUN 12  CREATININE 0.73  GLUCOSE 116*  CALCIUM 8.5    Assessment and Plan: Doing well postoperative day #1 He should feel to go home today. His pain is well-controlled with oral medications. He will not be starting any therapy at this time a shoulder and will remain in a sling at all times. He will followup in 2 weeks.  VTE proph: SCDs and aspirin

## 2012-04-12 NOTE — Evaluation (Signed)
Occupational Therapy Evaluation Patient Details Name: [name] MRN: [id number] DOB: Oct 04, 1955 Today's Date: 04/12/2012 Time: 4098-1191 OT Time Calculation (min): 26 min  OT Assessment / Plan / Recommendation Clinical Impression  56 yo male s/p Left Hemiarthoplasty by dr Luiz Blare that could benefit from follow up OT per MD orders.    OT Assessment  Patient does not need any further OT services    Follow Up Recommendations  No OT follow up    Barriers to Discharge      Equipment Recommendations  None recommended by OT (follow up with MD per d/c notes)    Recommendations for Other Services    Frequency       Precautions / Restrictions Precautions Precautions: Shoulder Type of Shoulder Precautions: hand, wrist, digit AROM, dangle arm with ADLS, NO active Shoulder movement Precaution Booklet Issued:  (hand out provided for shoulder) Required Braces or Orthoses:  (blue sling) Restrictions Weight Bearing Restrictions: Yes LUE Weight Bearing: Non weight bearing   Pertinent Vitals/Pain Minimal pain Ready to dc home    ADL  Eating/Feeding: Set up Where Assessed - Eating/Feeding: Chair Grooming: Minimal assistance Where Assessed - Grooming: Unsupported standing Upper Body Bathing: Right arm;Left arm;Moderate assistance Where Assessed - Upper Body Bathing: Unsupported standing Upper Body Dressing: Minimal assistance Where Assessed - Upper Body Dressing: Unsupported standing Lower Body Dressing: Moderate assistance Where Assessed - Lower Body Dressing: Unsupported standing Toilet Transfer: Set up Toilet Transfer Method: Sit to stand Toilet Transfer Equipment: Regular height toilet Transfers/Ambulation Related to ADLs: pt ambulated at supervision level ADL Comments: pt progressing well with OT session today. pt will require (A) for bathing and dressing initially due to ROM resticitions. pt is able to verbalize education back to OT to help direct care at home. pt performed  ub bathing and grooming at sink levle. pt with incr back pain and required sitting position after standing ~5 minutes. pt postitioned in chair. pt provide handout that covers all education. No further questions    OT Diagnosis:    OT Problem List:   OT Treatment Interventions:     OT Goals    Visit Information  Last OT Received On: 04/12/12 Assistance Needed: +1    Subjective Data  Subjective: "that oxygen hasn't worked since they put it on me"- Pt with nasal cannula. pt has oxygen tubing running to CPAP machine and nasal cannula not connected Patient Stated Goal: to go home today   Prior Functioning     Home Living Lives With: Spouse Available Help at Discharge: Family Additional Comments: pt will have assist of wife at d/c home Prior Function Level of Independence: Independent Communication Communication: No difficulties Dominant Hand: Right         Vision/Perception     Cognition  Overall Cognitive Status: Appears within functional limits for tasks assessed/performed Arousal/Alertness: Awake/alert Orientation Level: Appears intact for tasks assessed Behavior During Session: [facility] for tasks performed    Extremity/Trunk Assessment Right Upper Extremity Assessment RUE ROM/Strength/Tone: Within functional levels Left Upper Extremity Assessment LUE ROM/Strength/Tone: Deficits LUE ROM/Strength/Tone Deficits: see precautions Trunk Assessment Trunk Assessment: Normal     Mobility Bed Mobility Bed Mobility: Not assessed (plans to sleep on couch) Transfers Transfers: Sit to Stand;Stand to Sit Sit to Stand: 4: Min assist;With upper extremity assist;From bed Stand to Sit: 4: Min assist;With upper extremity assist;To chair/3-in-1 Details for Transfer Assistance: using Rt hand for support     Shoulder Instructions     Exercise     Balance  End of Session OT - End of Session Activity Tolerance: Patient tolerated treatment well Patient left: in chair;with  call bell/phone within reach Nurse Communication: Mobility status  GO Functional Assessment Tool Used: clinical judgement Functional Limitation: Self care Self Care Current Status (Z6109): At least 1 percent but less than 20 percent impaired, limited or restricted Self Care Goal Status (U0454): At least 1 percent but less than 20 percent impaired, limited or restricted Self Care Discharge Status [phone]): At least 1 percent but less than 20 percent impaired, limited or restricted   Lucile Shutters 04/12/2012, 9:46 AM Pager: [phone]

## 2012-04-12 NOTE — Progress Notes (Signed)
Utilization review completed. Jerelene Salaam, RN, BSN. 

## 2012-05-05 ENCOUNTER — Telehealth: Payer: Self-pay | Admitting: *Deleted

## 2012-05-05 NOTE — Telephone Encounter (Signed)
PATIENT CALLED TO REQUEST TO PLEASE ORDER FOR HIM THE MEDICATION THAT HE HAS BEEN TAKING FROM DR. CHANDLER AND DR. Luiz Blare WHO DID HIS TOTAL SHOULDER REPLACEMENT 04/11/2012. WAS TOLD THEY COULD NO LONGER PRESCRIBE FOR HIM. PATIENT IS REQUESTING ALPRAZOLAM 0.5MG  TABLETS  ONE OR TWO EVERY DAY. HE STATES HE HAS HAD NO PANIC OR ANXIETY ATTACKS AND FEELS BETTER ON THESE. PLEASE ADVISE. SUE

## 2012-05-05 NOTE — Telephone Encounter (Signed)
Ov would be needed to consider 

## 2012-05-05 NOTE — Telephone Encounter (Signed)
PATIENT NOTIFIED OF OV WOULD BE NEEDED. PATIENT SCHEDULED FOR 05/07/2011 @ 9;30AM

## 2012-05-05 NOTE — Telephone Encounter (Signed)
Pt requesting callback from [facility]'s nurse-question about medication and has recently had shoulder surgery.    

## 2012-05-05 NOTE — Telephone Encounter (Signed)
Pt requesting callback from [facility]'s nurse-question about medication and has recently had shoulder surgery.

## 2012-05-06 ENCOUNTER — Ambulatory Visit: Payer: Self-pay | Admitting: Endocrinology

## 2012-06-14 ENCOUNTER — Other Ambulatory Visit: Payer: Self-pay | Admitting: Neurology

## 2012-06-14 MED ORDER — LOVASTATIN 40 MG PO TABS: ORAL_TABLET | ORAL | Status: DC

## 2012-07-06 ENCOUNTER — Emergency Department (HOSPITAL_COMMUNITY): Payer: PRIVATE HEALTH INSURANCE

## 2012-07-06 ENCOUNTER — Emergency Department (HOSPITAL_COMMUNITY)
Admission: EM | Admit: 2012-07-06 | Discharge: 2012-07-06 | Disposition: A | Discharge status: Home / Self Care | Payer: PRIVATE HEALTH INSURANCE | Attending: Emergency Medicine | Admitting: Emergency Medicine

## 2012-07-06 ENCOUNTER — Encounter (HOSPITAL_COMMUNITY): Payer: Self-pay

## 2012-07-06 DIAGNOSIS — T148XXA Other injury of unspecified body region, initial encounter: Secondary | ICD-10-CM

## 2012-07-06 DIAGNOSIS — J45909 Unspecified asthma, uncomplicated: Secondary | ICD-10-CM | POA: Insufficient documentation

## 2012-07-06 DIAGNOSIS — Y9289 Other specified places as the place of occurrence of the external cause: Secondary | ICD-10-CM | POA: Insufficient documentation

## 2012-07-06 DIAGNOSIS — Z87448 Personal history of other diseases of urinary system: Secondary | ICD-10-CM | POA: Insufficient documentation

## 2012-07-06 DIAGNOSIS — Z79899 Other long term (current) drug therapy: Secondary | ICD-10-CM | POA: Insufficient documentation

## 2012-07-06 DIAGNOSIS — E119 Type 2 diabetes mellitus without complications: Secondary | ICD-10-CM | POA: Insufficient documentation

## 2012-07-06 DIAGNOSIS — Z8619 Personal history of other infectious and parasitic diseases: Secondary | ICD-10-CM | POA: Insufficient documentation

## 2012-07-06 DIAGNOSIS — IMO0002 Reserved for concepts with insufficient information to code with codable children: Secondary | ICD-10-CM | POA: Insufficient documentation

## 2012-07-06 DIAGNOSIS — X500XXA Overexertion from strenuous movement or load, initial encounter: Secondary | ICD-10-CM | POA: Insufficient documentation

## 2012-07-06 DIAGNOSIS — E785 Hyperlipidemia, unspecified: Secondary | ICD-10-CM | POA: Insufficient documentation

## 2012-07-06 DIAGNOSIS — Z8719 Personal history of other diseases of the digestive system: Secondary | ICD-10-CM | POA: Insufficient documentation

## 2012-07-06 DIAGNOSIS — F17200 Nicotine dependence, unspecified, uncomplicated: Secondary | ICD-10-CM | POA: Insufficient documentation

## 2012-07-06 DIAGNOSIS — Y9389 Activity, other specified: Secondary | ICD-10-CM | POA: Insufficient documentation

## 2012-07-06 DIAGNOSIS — M129 Arthropathy, unspecified: Secondary | ICD-10-CM | POA: Insufficient documentation

## 2012-07-06 DIAGNOSIS — I10 Essential (primary) hypertension: Secondary | ICD-10-CM | POA: Insufficient documentation

## 2012-07-06 NOTE — ED Provider Notes (Signed)
History    This chart was scribed for Donnetta Hutching, MD by Charolett Bumpers, ED Scribe. The patient was seen in room APA16A/APA16A. Patient's care was started at 1120.   CSN: [id number]  Arrival date & time 07/06/12  1027   First MD Initiated Contact with Patient 07/06/12 1120      Chief Complaint  Patient presents with  . Side Pain     The history is provided by the patient. No language interpreter was used.   [name] is a 57 y.o. male who presents to the Emergency Department complaining of constant, moderate left lateral side pain. He states that he was at physical therapy 6 days ago when his physical therapist stretched his left arm out. He states he felt a ripping sensation from his left axilla across his chest and into his abdomen and has had pain ever since. He states that pain is aggravated with coughing and taking deep breaths. He states that he feel SOB due to his pain. He states that it feels like gas in his chest/side. He has a h/o chronic bronchitis and uses an albuterol inhaler for his breathing. He has taken 5-325 oxycodone 1 tablet at a time for his pain with minimal relief. He has flexeril but is not taking them. He has a total left shoulder replacement on 04/11/12.   Past Medical History  Diagnosis Date  . ALLERGIC RHINITIS 03/01/2007  . ASTHMA 03/01/2007  . ERECTILE DYSFUNCTION 03/01/2007  . HYPERGLYCEMIA 03/01/2007  . HYPERLIPIDEMIA 03/01/2007  . HYPERTENSION 03/01/2007  . SHINGLES 03/01/2007  . Hx of echocardiogram     done by [place]   . Shortness of breath   . Sleep apnea     sees Dr. Shelle Iron - sleep study- 06/2011, uses CPAP q night   . Diabetes mellitus without complication     , borderline , per pt. , per Dr. [name]  . H/O hiatal hernia   . Arthritis     avascular necrosis - of L shoulder, knees- painful at time s    Past Surgical History  Procedure Laterality Date  . Stress cardiolite  11/01/2001  . Shoulder surgery  2005    Left  . Fx humerus       Left- 2005, required 2 surgeries   . Shoulder hemi-arthroplasty  04/11/2012    Procedure: SHOULDER HEMI-ARTHROPLASTY;  Surgeon: [name], MD;  Location: [facility] OR;  Service: Orthopedics;  Laterality: Left;  LEFT SHOULDER HEMIARTHROPLASTY     Family History  Problem Relation Age of Onset  . Cancer Neg Hx     History  Substance Use Topics  . Smoking status: Current Every Day Smoker -- 2.00 packs/day for 31 years    Types: Cigarettes  . Smokeless tobacco: Former Neurosurgeon  . Alcohol Use: No      Review of Systems A complete 10 system review of systems was obtained and all systems are negative except as noted in the HPI and PMH.   Allergies  Review of patient's allergies indicates no known allergies.  Home Medications   Current Outpatient Rx  Name  Route  Sig  Dispense  Refill  . albuterol (PROVENTIL,VENTOLIN) 90 MCG/ACT inhaler   Inhalation   Inhale 2 puffs into the lungs as needed for shortness of breath. For shortness of breath         . losartan-hydrochlorothiazide (HYZAAR) 100-25 MG per tablet   Oral   Take 1 tablet by mouth daily with supper.          Marland Kitchen  lovastatin (MEVACOR) 40 MG tablet   Oral   Take 80 mg by mouth at bedtime.         Marland Kitchen oxyCODONE-acetaminophen (PERCOCET/ROXICET) 5-325 MG per tablet   Oral   Take 1-2 tablets by mouth every 4 (four) hours as needed for pain.           BP 130/88  Pulse 78  Temp(Src) 98.1 F (36.7 C) (Oral)  Resp 21  Ht 6\' 3"  (1.905 m)  Wt 360 lb (163.295 kg)  BMI 45 kg/m2  SpO2 96%  Physical Exam  Nursing note and vitals reviewed. Constitutional: He is oriented to person, place, and time. He appears well-developed and well-nourished.  HENT:  Head: Normocephalic and atraumatic.  Eyes: Conjunctivae and EOM are normal. Pupils are equal, round, and reactive to light.  Neck: Normal range of motion. Neck supple.  Cardiovascular: Normal rate, regular rhythm and normal heart sounds.   Pulmonary/Chest:  Effort normal and breath sounds normal.  Abdominal: Soft. Bowel sounds are normal.  Musculoskeletal: Normal range of motion. He exhibits tenderness.  Minimal tenderness to the left lateral chest.  Neurological: He is alert and oriented to person, place, and time.  Skin: Skin is warm and dry.  Psychiatric: He has a normal mood and affect.    ED Course  Procedures (including critical care time)  DIAGNOSTIC STUDIES: Oxygen Saturation is 96% on room air, adequate by my interpretation.    COORDINATION OF CARE:  11:50-Discussed planned course of treatment with the patient including an x-ray, who is agreeable at this time. Pt refuses pain medication.     Labs Reviewed - No data to display Dg Abd Acute W/chest  07/06/2012  *RADIOLOGY REPORT*  Clinical Data: Left side pain for 8 days, hypertension, smoker, diabetes  ACUTE ABDOMEN SERIES (ABDOMEN 2 VIEW & CHEST 1 VIEW)  Comparison: Chest radiograph 04/06/2012  Findings: Enlargement of cardiac silhouette. Mediastinal contours and pulmonary vascularity normal. Chronic bronchitic and probable emphysematous changes. Chronic blunting of costophrenic angle. No definite infiltrate, significant pleural effusion or pneumothorax. Left shoulder prosthesis. Nonobstructive bowel gas pattern. No bowel dilatation, bowel wall thickening, or free intraperitoneal air. Osseous structures unremarkable. No urinary tract calcification.  IMPRESSION: Chronic bronchitic and probable emphysematous changes. No acute abdominal findings.   Original Report Authenticated By: Ulyses Southward, M.D.      No diagnosis found.    MDM  Patient is in no acute distress. Acute abdominal series shows no acute findings.    I personally performed the services described in this documentation, which was scribed in my presence. The recorded information has been reviewed and is accurate.      Donnetta Hutching, MD 07/06/12 1339

## 2012-07-06 NOTE — Discharge Instructions (Signed)
X-ray shows no acute findings. Alternate Tylenol and ibuprofen for pain.  Followup your regular doctor

## 2012-07-06 NOTE — ED Notes (Signed)
Patient with no complaints at this time. Respirations even and unlabored. Skin warm/dry. Discharge instructions reviewed with patient at this time. Patient given opportunity to voice concerns/ask questions. Patient discharged at this time and left Emergency Department with steady gait.   

## 2012-07-06 NOTE — ED Notes (Signed)
Pt reports was having PT last week and therapist pulled left arm.  PT says felt like pulled something in his left side.  Reports has had pain since in left side, abd, and chest.  Feels better when he holds his left arm away from his side.  Also reports coughing makes pain worse.

## 2012-08-02 ENCOUNTER — Other Ambulatory Visit: Payer: Self-pay | Admitting: *Deleted

## 2012-08-02 MED ORDER — LOVASTATIN 40 MG PO TABS: 80.0000 mg | ORAL_TABLET | Freq: Every day | ORAL | Status: DC

## 2012-10-05 IMAGING — CR DG CHEST 2V
3 series · 3 of 3 positions shown · non-contrast
Comparison: 12/21/2011

CLINICAL DATA: Cough.  Muscle pain.  Back pain.

CHEST - 2 VIEW

[view not recorded (1 of 3)]
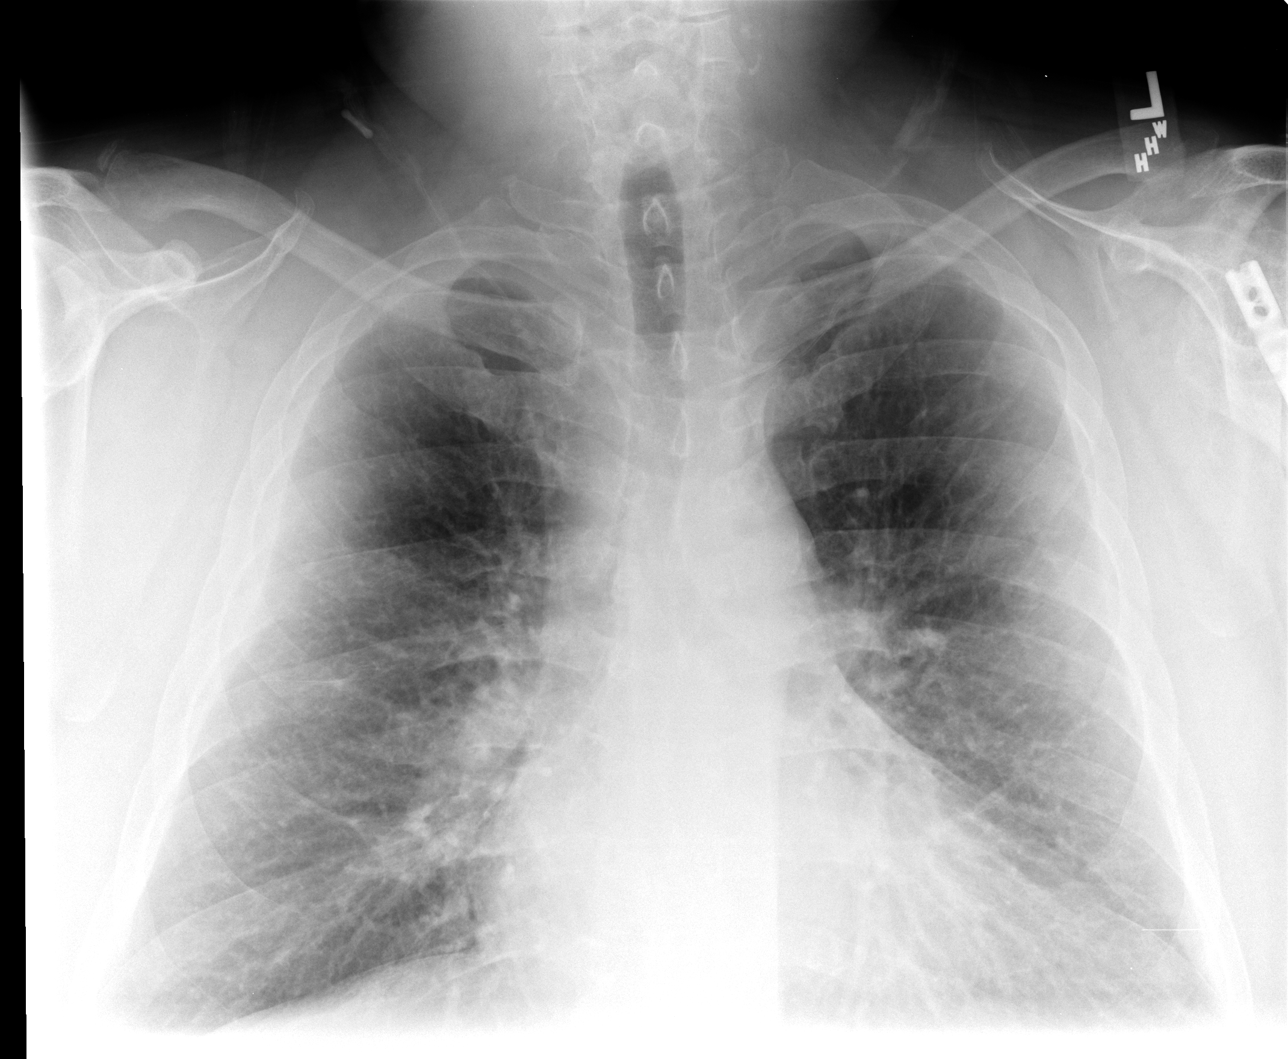

[view not recorded (2 of 3)]
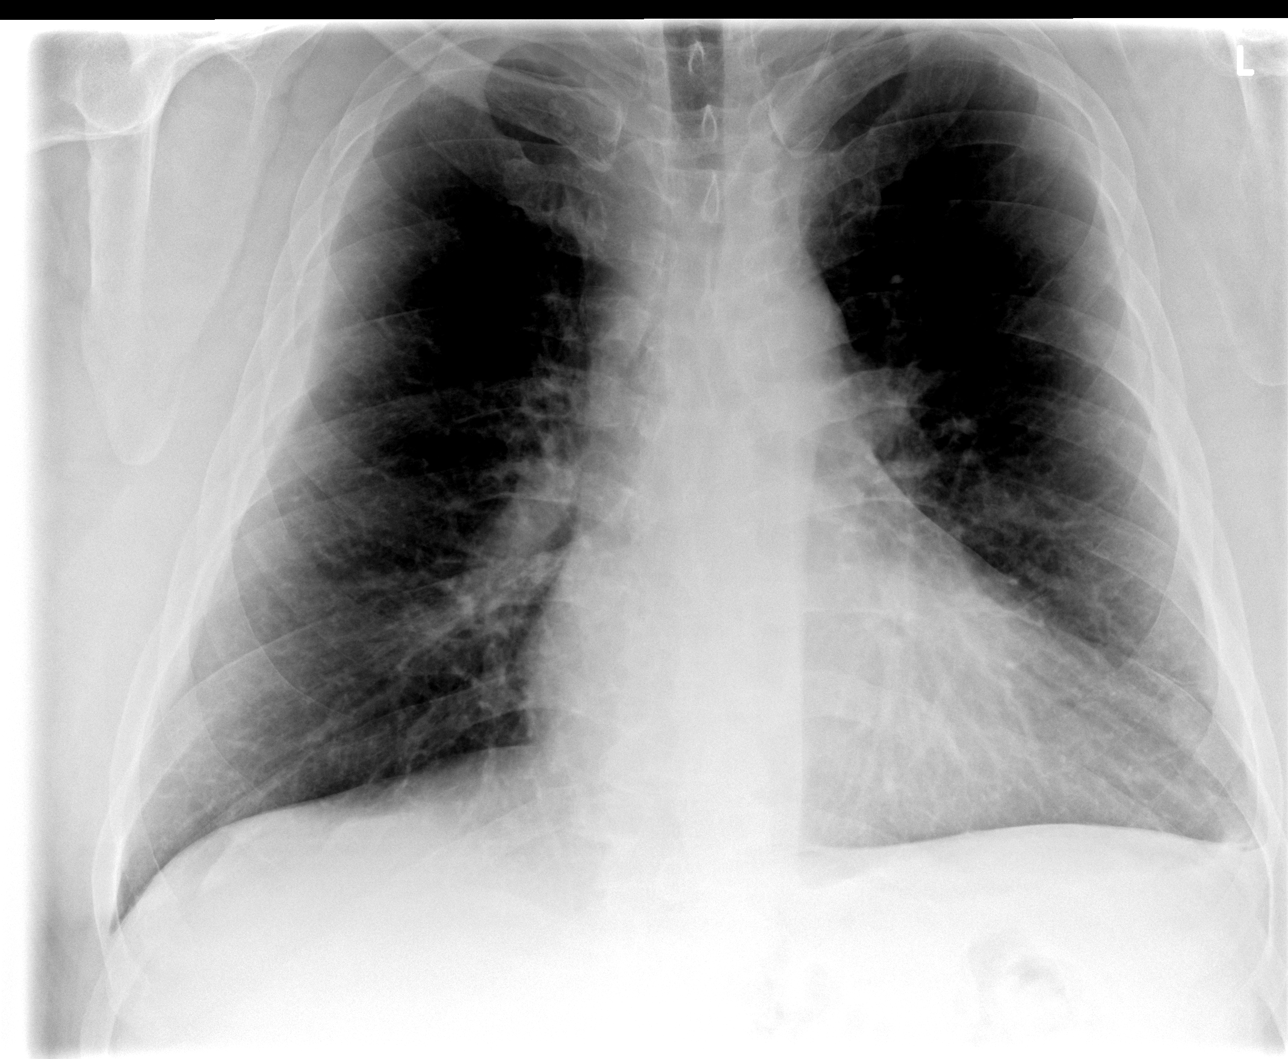

[view not recorded (3 of 3)]
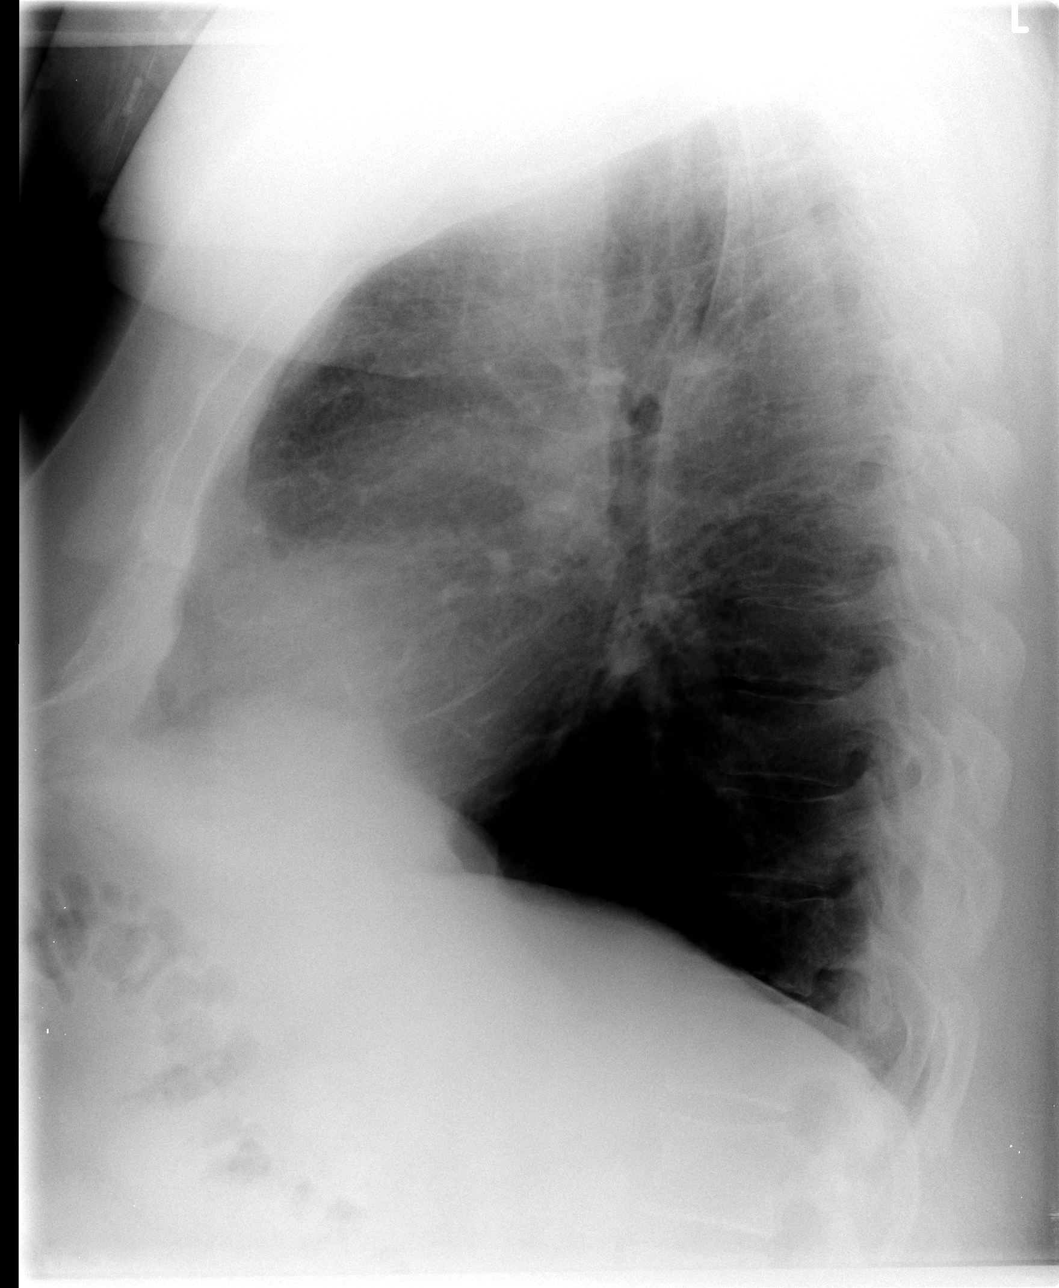

[3 of 3 positions shown; findings below may reference images not displayed]

FINDINGS: Cardiac enlargement with mild pulmonary vascular
congestion and interstitial changes suggesting edema.
Emphysematous changes and scattered fibrosis in the lungs.  No
focal airspace consolidation.  Blunting of the left costophrenic
angles suggesting fluid or thickened pleura.  No pneumothorax.
Mediastinal contours appear intact.
IMPRESSION: Cardiac enlargement with mild pulmonary vascular congestion and
edema.  Fluid or thickened pleura in the left costophrenic angle.
Emphysematous changes.

## 2012-10-07 ENCOUNTER — Encounter (HOSPITAL_COMMUNITY): Payer: Self-pay | Admitting: Emergency Medicine

## 2012-10-07 ENCOUNTER — Emergency Department (HOSPITAL_COMMUNITY)
Admission: EM | Admit: 2012-10-07 | Discharge: 2012-10-07 | Disposition: A | Discharge status: Home / Self Care | Payer: PRIVATE HEALTH INSURANCE | Attending: Emergency Medicine | Admitting: Emergency Medicine

## 2012-10-07 ENCOUNTER — Emergency Department (HOSPITAL_COMMUNITY): Payer: PRIVATE HEALTH INSURANCE

## 2012-10-07 DIAGNOSIS — I10 Essential (primary) hypertension: Secondary | ICD-10-CM | POA: Insufficient documentation

## 2012-10-07 DIAGNOSIS — Z8719 Personal history of other diseases of the digestive system: Secondary | ICD-10-CM | POA: Insufficient documentation

## 2012-10-07 DIAGNOSIS — Z79899 Other long term (current) drug therapy: Secondary | ICD-10-CM | POA: Insufficient documentation

## 2012-10-07 DIAGNOSIS — M129 Arthropathy, unspecified: Secondary | ICD-10-CM | POA: Insufficient documentation

## 2012-10-07 DIAGNOSIS — E785 Hyperlipidemia, unspecified: Secondary | ICD-10-CM | POA: Insufficient documentation

## 2012-10-07 DIAGNOSIS — E1169 Type 2 diabetes mellitus with other specified complication: Secondary | ICD-10-CM | POA: Insufficient documentation

## 2012-10-07 DIAGNOSIS — F17200 Nicotine dependence, unspecified, uncomplicated: Secondary | ICD-10-CM | POA: Insufficient documentation

## 2012-10-07 DIAGNOSIS — J45901 Unspecified asthma with (acute) exacerbation: Secondary | ICD-10-CM | POA: Insufficient documentation

## 2012-10-07 DIAGNOSIS — R05 Cough: Secondary | ICD-10-CM | POA: Insufficient documentation

## 2012-10-07 DIAGNOSIS — R509 Fever, unspecified: Secondary | ICD-10-CM | POA: Insufficient documentation

## 2012-10-07 DIAGNOSIS — R059 Cough, unspecified: Secondary | ICD-10-CM | POA: Insufficient documentation

## 2012-10-07 DIAGNOSIS — Z8619 Personal history of other infectious and parasitic diseases: Secondary | ICD-10-CM | POA: Insufficient documentation

## 2012-10-07 DIAGNOSIS — R062 Wheezing: Secondary | ICD-10-CM | POA: Insufficient documentation

## 2012-10-07 DIAGNOSIS — Z8659 Personal history of other mental and behavioral disorders: Secondary | ICD-10-CM | POA: Insufficient documentation

## 2012-10-07 MED ORDER — IPRATROPIUM BROMIDE 0.02 % IN SOLN
0.5000 mg | Freq: Once | RESPIRATORY_TRACT | Status: AC
  Administered 2012-10-07: 0.5 mg via RESPIRATORY_TRACT
  Filled 2012-10-07: qty 2.5

## 2012-10-07 MED ORDER — PREDNISONE 50 MG PO TABS: 50.0000 mg | ORAL_TABLET | Freq: Every day | ORAL | Status: DC

## 2012-10-07 MED ORDER — PREDNISONE 50 MG PO TABS
60.0000 mg | ORAL_TABLET | Freq: Once | ORAL | Status: AC
  Administered 2012-10-07: 60 mg via ORAL
  Filled 2012-10-07: qty 1

## 2012-10-07 MED ORDER — ALBUTEROL SULFATE (5 MG/ML) 0.5% IN NEBU
5.0000 mg | INHALATION_SOLUTION | Freq: Once | RESPIRATORY_TRACT | Status: AC
  Administered 2012-10-07: 5 mg via RESPIRATORY_TRACT
  Filled 2012-10-07: qty 1

## 2012-10-07 NOTE — ED Provider Notes (Signed)
History     CSN: [id number]  Arrival date & time 10/07/12  4098   First MD Initiated Contact with Patient 10/07/12 [phone]      Chief Complaint  Patient presents with  . Shortness of Breath  . Fever    (Consider location/radiation/quality/duration/timing/severity/associated sxs/prior treatment) HPI HX per PT - cough, wheezing body aches and subj fever since last night, tried his inhaler without relief, coughing up clear sputum, no hemoptysis. No CP. No rash. No known sick contacts, symptoms MOD in severity, worse with exertion, is a smoker.  Past Medical History  Diagnosis Date  . ALLERGIC RHINITIS 03/01/2007  . ASTHMA 03/01/2007  . ERECTILE DYSFUNCTION 03/01/2007  . HYPERGLYCEMIA 03/01/2007  . HYPERLIPIDEMIA 03/01/2007  . HYPERTENSION 03/01/2007  . SHINGLES 03/01/2007  . Hx of echocardiogram     done by [facility]   . Shortness of breath   . Sleep apnea     sees Dr. Shelle Iron - sleep study- 06/2011, uses CPAP q night   . Diabetes mellitus without complication     , borderline , per pt. , per Dr. [name]  . H/O hiatal hernia   . Arthritis     avascular necrosis - of L shoulder, knees- painful at time s    Past Surgical History  Procedure Laterality Date  . Stress cardiolite  11/01/2001  . Shoulder surgery  2005    Left  . Fx humerus      Left- 2005, required 2 surgeries   . Shoulder hemi-arthroplasty  04/11/2012    Procedure: SHOULDER HEMI-ARTHROPLASTY;  Surgeon: [name], MD;  Location: [facility] OR;  Service: Orthopedics;  Laterality: Left;  LEFT SHOULDER HEMIARTHROPLASTY     Family History  Problem Relation Age of Onset  . Cancer Neg Hx     History  Substance Use Topics  . Smoking status: Current Every Day Smoker -- 2.00 packs/day for 31 years    Types: Cigarettes  . Smokeless tobacco: Former Neurosurgeon  . Alcohol Use: No      Review of Systems  Constitutional: Negative for fever and chills.  HENT: Negative for neck pain and neck stiffness.   Eyes: Negative for  pain.  Respiratory: Positive for cough, shortness of breath and wheezing.   Cardiovascular: Negative for chest pain.  Gastrointestinal: Negative for abdominal pain.  Genitourinary: Negative for dysuria.  Musculoskeletal: Negative for back pain.  Skin: Negative for rash.  Neurological: Negative for headaches.  All other systems reviewed and are negative.    Allergies  Review of patient's allergies indicates no known allergies.  Home Medications   Current Outpatient Rx  Name  Route  Sig  Dispense  Refill  . albuterol (PROVENTIL,VENTOLIN) 90 MCG/ACT inhaler   Inhalation   Inhale 2 puffs into the lungs as needed for shortness of breath. For shortness of breath         . losartan-hydrochlorothiazide (HYZAAR) 100-25 MG per tablet   Oral   Take 1 tablet by mouth daily with supper.          . lovastatin (MEVACOR) 40 MG tablet   Oral   Take 2 tablets (80 mg total) by mouth at bedtime.   60 tablet   2     PT NEEDS TO SCHEDULE YEARLY EXAM.   . traMADol (ULTRAM) 50 MG tablet   Oral   Take 50 mg by mouth every 6 (six) hours as needed for pain.         Marland Kitchen oxyCODONE-acetaminophen (PERCOCET/ROXICET) 5-325 MG per  tablet   Oral   Take 1-2 tablets by mouth every 4 (four) hours as needed for pain.           BP 131/61  Pulse 78  Temp(Src) 97.8 F (36.6 C) (Oral)  Resp 20  Ht 6\' 3"  (1.905 m)  Wt 360 lb (163.295 kg)  BMI 45 kg/m2  SpO2 97%  Physical Exam  Constitutional: He is oriented to person, place, and time. He appears well-developed and well-nourished.  HENT:  Head: Normocephalic and atraumatic.  Eyes: EOM are normal. Pupils are equal, round, and reactive to light.  Neck: Neck supple. No tracheal deviation present.  Cardiovascular: Normal rate, regular rhythm and intact distal pulses.   Pulmonary/Chest: No stridor. He exhibits no tenderness.  Bilateral exp wheezes with prolonged expirations  Abdominal: Soft. Bowel sounds are normal. He exhibits no distension.  There is no tenderness.  Musculoskeletal: Normal range of motion. He exhibits no edema.  No calf tenderness  Neurological: He is alert and oriented to person, place, and time.  Skin: Skin is warm and dry.    ED Course  Procedures (including critical care time)  CXR Albuterol Neb Prednisone  Plan - imaging, medications and re-evaluation  MDM  SOB/ cough/ wheezing in a smoker with h/o asthma        Sunnie Nielsen, MD 10/07/12 [phone]

## 2012-10-07 NOTE — ED Provider Notes (Signed)
7:27 AM Patient signed out to me by Dr. Dierdre Highman. He presented with exacerbation of asthma which is probably related to change in weather. He feels better after treatment with albuterol. On exam, he still has faint wheezing and prolonged exhalation phase. He'll be given a second nebulizer treatment with albuterol and Atrovent. He has received his initial dose of prednisone.  8:15 AM Patient feels much better after second nebulizer treatment. On reexam, lungs are completely clear. He is discharged with prescription for prednisone.  Dione Booze, MD 10/07/12 [phone]

## 2012-10-07 NOTE — ED Notes (Signed)
Patient c/o shortness of breath.  States was in the rain yesterday and then went to physical therapy in the air conditioner and began getting worse last night.  Patient with productive cough that is clear in color.

## 2012-10-13 ENCOUNTER — Other Ambulatory Visit: Payer: Self-pay

## 2012-10-13 MED ORDER — LOVASTATIN 40 MG PO TABS: 80.0000 mg | ORAL_TABLET | Freq: Every day | ORAL | Status: DC

## 2012-12-19 ENCOUNTER — Other Ambulatory Visit: Payer: Self-pay | Admitting: *Deleted

## 2012-12-19 MED ORDER — LOVASTATIN 40 MG PO TABS: 80.0000 mg | ORAL_TABLET | Freq: Every day | ORAL | Status: DC

## 2012-12-19 NOTE — Telephone Encounter (Signed)
Opened encounter in error  

## 2013-03-22 ENCOUNTER — Other Ambulatory Visit: Payer: Self-pay

## 2013-03-22 ENCOUNTER — Other Ambulatory Visit: Payer: Self-pay | Admitting: Endocrinology

## 2013-03-22 DIAGNOSIS — Z0389 Encounter for observation for other suspected diseases and conditions ruled out: Secondary | ICD-10-CM

## 2013-03-22 DIAGNOSIS — Z0000 Encounter for general adult medical examination without abnormal findings: Secondary | ICD-10-CM

## 2013-03-22 LAB — BASIC METABOLIC PANEL
CO2: 30 mEq/L (ref 19–32)
Calcium: 9.2 mg/dL (ref 8.4–10.5)
Chloride: 102 mEq/L (ref 96–112)
Creat: 0.78 mg/dL (ref 0.50–1.35)
Glucose, Bld: 98 mg/dL (ref 70–99)
Potassium: 4.2 mEq/L (ref 3.5–5.3)
Sodium: 139 mEq/L (ref 135–145)

## 2013-03-22 LAB — TSH: TSH: 1.018 u[IU]/mL (ref 0.350–4.500)

## 2013-03-22 LAB — LIPID PANEL
Cholesterol: 110 mg/dL (ref 0–200)
Total CHOL/HDL Ratio: 2.8 Ratio
Triglycerides: 79 mg/dL (ref <150)

## 2013-03-22 LAB — HEPATIC FUNCTION PANEL
ALT: 14 U/L (ref 0–53)
AST: 12 U/L (ref 0–37)
Alkaline Phosphatase: 50 U/L (ref 39–117)
Bilirubin, Direct: 0.1 mg/dL (ref 0.0–0.3)
Indirect Bilirubin: 0.4 mg/dL (ref 0.0–0.9)

## 2013-03-22 LAB — CBC WITH DIFFERENTIAL/PLATELET
Basophils Absolute: 0.1 10*3/uL (ref 0.0–0.1)
Lymphocytes Relative: 23 % (ref 12–46)
Neutro Abs: 6 10*3/uL (ref 1.7–7.7)
Neutrophils Relative %: 68 % (ref 43–77)
Platelets: 185 10*3/uL (ref 150–400)
RDW: 13.6 % (ref 11.5–15.5)
WBC: 8.7 10*3/uL (ref 4.0–10.5)

## 2013-03-22 MED ORDER — LOSARTAN POTASSIUM-HCTZ 100-25 MG PO TABS: 1.0000 | ORAL_TABLET | Freq: Every day | ORAL | Status: DC

## 2013-03-22 NOTE — Telephone Encounter (Signed)
Rx request for losartain-hctz 100-25.  Rx sent to [facility].  Pt has ov on 03/29/13.

## 2013-03-23 LAB — URINALYSIS, ROUTINE W REFLEX MICROSCOPIC
Bilirubin Urine: NEGATIVE
Ketones, ur: NEGATIVE mg/dL
Leukocytes, UA: NEGATIVE
Specific Gravity, Urine: 1.017 (ref 1.005–1.030)
pH: 5.5 (ref 5.0–8.0)

## 2013-03-23 LAB — URINALYSIS, MICROSCOPIC ONLY
Casts: NONE SEEN
Crystals: NONE SEEN
Squamous Epithelial / LPF: NONE SEEN

## 2013-03-29 ENCOUNTER — Encounter: Payer: Self-pay | Admitting: Endocrinology

## 2013-03-29 ENCOUNTER — Ambulatory Visit (INDEPENDENT_AMBULATORY_CARE_PROVIDER_SITE_OTHER): Payer: PRIVATE HEALTH INSURANCE | Admitting: Endocrinology

## 2013-03-29 VITALS — BP 130/80 | HR 78 | Ht 76.0 in | Wt 349.0 lb

## 2013-03-29 DIAGNOSIS — L089 Local infection of the skin and subcutaneous tissue, unspecified: Secondary | ICD-10-CM

## 2013-03-29 DIAGNOSIS — Z0000 Encounter for general adult medical examination without abnormal findings: Secondary | ICD-10-CM

## 2013-03-29 DIAGNOSIS — Z0389 Encounter for observation for other suspected diseases and conditions ruled out: Secondary | ICD-10-CM

## 2013-03-29 MED ORDER — DOXYCYCLINE HYCLATE 100 MG PO TABS: 100.0000 mg | ORAL_TABLET | Freq: Two times a day (BID) | ORAL | Status: DC

## 2013-03-29 MED ORDER — TRAMADOL HCL 50 MG PO TABS: 50.0000 mg | ORAL_TABLET | Freq: Four times a day (QID) | ORAL | Status: DC | PRN

## 2013-03-29 NOTE — Progress Notes (Signed)
Subjective:    Patient ID: [name], male    DOB: Nov 14, 1955, 57 y.o.   MRN: [id number]  HPI Pt is here for regular wellness examination, and is feeling pretty well in general, and says chronic med probs are stable, except as noted below Past Medical History  Diagnosis Date  . ALLERGIC RHINITIS 03/01/2007  . ASTHMA 03/01/2007  . ERECTILE DYSFUNCTION 03/01/2007  . HYPERGLYCEMIA 03/01/2007  . HYPERLIPIDEMIA 03/01/2007  . HYPERTENSION 03/01/2007  . SHINGLES 03/01/2007  . Hx of echocardiogram     done by [facility] [place]   . Shortness of breath   . Sleep apnea     sees Dr. Shelle Iron - sleep study- 06/2011, uses CPAP q night   . Diabetes mellitus without complication     , borderline , per pt. , per Dr. [name]  . H/O hiatal hernia   . Arthritis     avascular necrosis - of L shoulder, knees- painful at time s    Past Surgical History  Procedure Laterality Date  . Stress cardiolite  11/01/2001  . Shoulder surgery  2005    Left  . Fx humerus      Left- 2005, required 2 surgeries   . Shoulder hemi-arthroplasty  04/11/2012    Procedure: SHOULDER HEMI-ARTHROPLASTY;  Surgeon: [name], MD;  Location: [facility] OR;  Service: Orthopedics;  Laterality: Left;  LEFT SHOULDER HEMIARTHROPLASTY     History   Social History  . Marital Status: Married    Spouse Name: N/A    Number of Children: 2  . Years of Education: N/A   Occupational History  . [employer]    Social History Main Topics  . Smoking status: Current Every Day Smoker -- 2.00 packs/day for 31 years    Types: Cigarettes  . Smokeless tobacco: Former Neurosurgeon  . Alcohol Use: No  . Drug Use: No  . Sexual Activity: Not on file   Other Topics Concern  . Not on file   Social History Narrative  . No narrative on file    Current Outpatient Prescriptions on File Prior to Visit  Medication Sig Dispense Refill  . albuterol (PROVENTIL,VENTOLIN) 90 MCG/ACT inhaler Inhale 2 puffs into the lungs as needed for shortness of  breath. For shortness of breath      . losartan-hydrochlorothiazide (HYZAAR) 100-25 MG per tablet Take 1 tablet by mouth daily with supper.  90 tablet  0   No current facility-administered medications on file prior to visit.    No Known Allergies  Family History  Problem Relation Age of Onset  . Cancer Neg Hx    BP 130/80  Pulse 78  Ht 6\' 4"  (1.93 m)  Wt 349 lb (158.305 kg)  BMI 42.50 kg/m2  SpO2 97%  Review of Systems  Constitutional: Negative for fever.  HENT: Negative for hearing loss.   Eyes: Negative for visual disturbance.  Respiratory: Negative for shortness of breath.   Cardiovascular: Negative for chest pain.  Gastrointestinal: Negative for anal bleeding.  Endocrine: Negative for cold intolerance.  Genitourinary: Negative for hematuria and difficulty urinating.  Musculoskeletal: Negative for back pain.  Skin: Negative for rash.  Allergic/Immunologic: Positive for environmental allergies.  Neurological: Negative for syncope.  Hematological: Does not bruise/bleed easily.  Psychiatric/Behavioral: Negative for dysphoric mood.       Objective:   Physical Exam VS: see vs page GEN: no distress HEAD: head: no deformity eyes: no periorbital swelling, no proptosis external nose and ears are normal mouth: no lesion  seen NECK: supple, thyroid is not enlarged CHEST WALL: no deformity LUNGS: clear to auscultation BREASTS:  No gynecomastia CV: reg rate and rhythm, no murmur ABD: abdomen is soft, nontender.  no hepatosplenomegaly.  not distended.  no hernia.   RECTAL: normal external and internal exam.  heme neg. PROSTATE:  Normal size.  No nodule. MUSCULOSKELETAL: muscle bulk and strength are grossly normal.  no obvious joint swelling.  gait is normal and steady.  ROM of left shoulder is limited by pain.   EXTEMITIES: no deformity.  no ulcer on the feet.  feet are of normal color and temp.  no edema PULSES: dorsalis pedis intact bilat.  no carotid bruit.   NEURO:  cn  2-12 grossly intact.   readily moves all 4's.  sensation is intact to touch on the feet SKIN:  Normal texture and temperature.  No rash or suspicious lesion is visible.   NODES:  None palpable at the neck PSYCH: alert, oriented x3.  Does not appear anxious nor depressed.      Assessment & Plan:  Wellness visit today, with problems stable, except as noted. we discussed code status.  pt requests full code, but would not want to be started or maintained on artificial life-support measures if there was not a reasonable chance of recovery    SEPARATE EVALUATION FOLLOWS--EACH PROBLEM HERE IS NEW, NOT RESPONDING TO TREATMENT, OR POSES SIGNIFICANT RISK TO THE PATIENT'S HEALTH: HISTORY OF THE PRESENT ILLNESS: Pt states 1 week of a "lump" at the left buttock, and assoc pain left shoulder pain is much better, but persists He takes mevacor as rx'ed.  PAST MEDICAL HISTORY reviewed and up to date today REVIEW OF SYSTEMS: He has lost weight, due to his efforts.  Denies headache. PHYSICAL EXAMINATION: VITAL SIGNS:  See vs page GENERAL: no distress Right shoulder: Old healed surgical scar.  Full ROM, but ROM is painful. Right buttock: 1 cm pustule LAB/XRAY RESULTS: Lab Results  Component Value Date   CHOL 110 03/22/2013   HDL 40 03/22/2013   LDLCALC 54 03/22/2013   LDLDIRECT 65 03/22/2013   TRIG 79 03/22/2013   CHOLHDL 2.8 03/22/2013  IMPRESSION: Dyslipidemia: he can be managed on a lower dosage of mevacor Shoulder pain, persistent despite surgery Pustule, new PLAN: See instruction page

## 2013-03-29 NOTE — Patient Instructions (Addendum)
Please decrease lovastatin to just 1 pill each evening.   i have sent a prescription to your pharmacy, for an antibiotic pill. Here is a refill of the tramadol.   please consider these measures for your health:  minimize alcohol.  do not use tobacco products.  have a colonoscopy at least every 10 years from age 57.  keep firearms safely stored.  always use seat belts.  have working smoke alarms in your home.  see an eye doctor and dentist regularly.  never drive under the influence of alcohol or drugs (including prescription drugs).  those with fair skin should take precautions against the sun.   Refer for a colonoscopy.  you will receive a phone call, about a day and time for an appointment Please return in 1 year.

## 2013-04-10 ENCOUNTER — Encounter: Payer: Self-pay | Admitting: Internal Medicine

## 2013-04-12 ENCOUNTER — Other Ambulatory Visit: Payer: Self-pay | Admitting: *Deleted

## 2013-04-12 MED ORDER — LOVASTATIN 40 MG PO TABS: 40.0000 mg | ORAL_TABLET | Freq: Every day | ORAL | Status: DC

## 2013-04-30 ENCOUNTER — Emergency Department (HOSPITAL_COMMUNITY)
Admission: EM | Admit: 2013-04-30 | Discharge: 2013-04-30 | Disposition: A | Discharge status: Home / Self Care | Payer: PRIVATE HEALTH INSURANCE | Attending: Emergency Medicine | Admitting: Emergency Medicine

## 2013-04-30 ENCOUNTER — Encounter (HOSPITAL_COMMUNITY): Payer: Self-pay | Admitting: Emergency Medicine

## 2013-04-30 ENCOUNTER — Emergency Department (HOSPITAL_COMMUNITY): Payer: PRIVATE HEALTH INSURANCE

## 2013-04-30 DIAGNOSIS — Z8739 Personal history of other diseases of the musculoskeletal system and connective tissue: Secondary | ICD-10-CM | POA: Insufficient documentation

## 2013-04-30 DIAGNOSIS — Z87448 Personal history of other diseases of urinary system: Secondary | ICD-10-CM | POA: Insufficient documentation

## 2013-04-30 DIAGNOSIS — J3489 Other specified disorders of nose and nasal sinuses: Secondary | ICD-10-CM | POA: Insufficient documentation

## 2013-04-30 DIAGNOSIS — Z872 Personal history of diseases of the skin and subcutaneous tissue: Secondary | ICD-10-CM | POA: Insufficient documentation

## 2013-04-30 DIAGNOSIS — Z8719 Personal history of other diseases of the digestive system: Secondary | ICD-10-CM | POA: Insufficient documentation

## 2013-04-30 DIAGNOSIS — Z9981 Dependence on supplemental oxygen: Secondary | ICD-10-CM | POA: Insufficient documentation

## 2013-04-30 DIAGNOSIS — J40 Bronchitis, not specified as acute or chronic: Secondary | ICD-10-CM

## 2013-04-30 DIAGNOSIS — J441 Chronic obstructive pulmonary disease with (acute) exacerbation: Secondary | ICD-10-CM

## 2013-04-30 DIAGNOSIS — R509 Fever, unspecified: Secondary | ICD-10-CM | POA: Insufficient documentation

## 2013-04-30 DIAGNOSIS — R5381 Other malaise: Secondary | ICD-10-CM | POA: Insufficient documentation

## 2013-04-30 DIAGNOSIS — E119 Type 2 diabetes mellitus without complications: Secondary | ICD-10-CM | POA: Insufficient documentation

## 2013-04-30 DIAGNOSIS — G4730 Sleep apnea, unspecified: Secondary | ICD-10-CM | POA: Insufficient documentation

## 2013-04-30 DIAGNOSIS — I10 Essential (primary) hypertension: Secondary | ICD-10-CM | POA: Insufficient documentation

## 2013-04-30 DIAGNOSIS — E785 Hyperlipidemia, unspecified: Secondary | ICD-10-CM | POA: Insufficient documentation

## 2013-04-30 DIAGNOSIS — F17200 Nicotine dependence, unspecified, uncomplicated: Secondary | ICD-10-CM | POA: Insufficient documentation

## 2013-04-30 DIAGNOSIS — Z79899 Other long term (current) drug therapy: Secondary | ICD-10-CM | POA: Insufficient documentation

## 2013-04-30 MED ORDER — AMOXICILLIN 500 MG PO CAPS: 500.0000 mg | ORAL_CAPSULE | Freq: Three times a day (TID) | ORAL | Status: DC

## 2013-04-30 MED ORDER — PREDNISONE 10 MG PO TABS
60.0000 mg | ORAL_TABLET | Freq: Once | ORAL | Status: AC
  Administered 2013-04-30: 60 mg via ORAL
  Filled 2013-04-30 (×2): qty 1

## 2013-04-30 MED ORDER — LORATADINE 10 MG PO TABS: 10.0000 mg | ORAL_TABLET | Freq: Every day | ORAL | Status: DC

## 2013-04-30 MED ORDER — IPRATROPIUM BROMIDE 0.02 % IN SOLN
0.5000 mg | Freq: Once | RESPIRATORY_TRACT | Status: AC
Start: 2013-04-30 — End: 2013-04-30
  Administered 2013-04-30: 0.5 mg via RESPIRATORY_TRACT
  Filled 2013-04-30: qty 2.5

## 2013-04-30 MED ORDER — PREDNISONE 20 MG PO TABS: ORAL_TABLET | ORAL | Status: DC

## 2013-04-30 MED ORDER — AMOXICILLIN 250 MG PO CAPS
500.0000 mg | ORAL_CAPSULE | Freq: Once | ORAL | Status: AC
  Administered 2013-04-30: 500 mg via ORAL
  Filled 2013-04-30: qty 2

## 2013-04-30 MED ORDER — DM-GUAIFENESIN ER 30-600 MG PO TB12: 1.0000 | ORAL_TABLET | Freq: Two times a day (BID) | ORAL | Status: DC

## 2013-04-30 MED ORDER — ALBUTEROL SULFATE HFA 108 (90 BASE) MCG/ACT IN AERS: 2.0000 | INHALATION_SPRAY | RESPIRATORY_TRACT | Status: DC | PRN

## 2013-04-30 MED ORDER — ALBUTEROL (5 MG/ML) CONTINUOUS INHALATION SOLN
15.0000 mg/h | INHALATION_SOLUTION | Freq: Once | RESPIRATORY_TRACT | Status: AC
  Administered 2013-04-30: 15 mg/h via RESPIRATORY_TRACT
  Filled 2013-04-30: qty 20

## 2013-04-30 NOTE — ED Notes (Signed)
Pt states he began feeling bad on Wednesday. Symtpoms include runny nose, productive cough, green and yellow in color. SOB at times and generalized weakness.

## 2013-04-30 NOTE — ED Notes (Signed)
Per respiratory request - patient placed on monitor due to hour long breathing treatments.

## 2013-04-30 NOTE — ED Notes (Signed)
Pt upset due to continuous neb treatment lasted longer than an hour, states that he is going to leave whether or not he is discharged, upset due to being here so long "I've been here since 1030 this morning", EDP Knapp made aware of above and possible leaving AMA, new order received to ambulate pt on RA with sats

## 2013-04-30 NOTE — ED Notes (Addendum)
Pt with productive cough and felt bad since Wednesday, unsure of fever earlier, decrease appetite, denies N/V or diarrhea

## 2013-04-30 NOTE — ED Provider Notes (Signed)
CSN: [id number]     Arrival date & time 04/30/13  1038 History  This chart was scribed for Ward Givens, MD by Dorothey Baseman, ED Scribe. This patient was seen in room APA12/APA12 and the patient's care was started at 2:08 PM.    Chief Complaint  Patient presents with  . Cough  . Shortness of Breath  . Fatigue   The history is provided by the patient. No language interpreter was used.   HPI Comments: [name] is a 57 y.o. 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 He reports associated chills, fever (101 measured at home), rhinorrhea with yellow-green mucus, shortness of breath, wheezes. He reports taking ibuprofen at home with temporary relief of the fever. He reports taking Alka Seltzer Cold Plus and his albuterol inhaler at home with mild, temporary relief. He denies sore throat, nausea, emesis, diarrhea. Patient also reports a history of pneumonia that he was hospitalized for several years ago.  He states he is usually placed on steroids when he has a flare up. Patient also has a history of asthma, hyperlipidemia, HTN, and DM. Patient reports that he is a current, every day smoker, 1 PPD.   PCP Dr George Hugh  Past Medical History  Diagnosis Date  . ALLERGIC RHINITIS 03/01/2007  . ASTHMA 03/01/2007  . ERECTILE DYSFUNCTION 03/01/2007  . HYPERGLYCEMIA 03/01/2007  . HYPERLIPIDEMIA 03/01/2007  . HYPERTENSION 03/01/2007  . SHINGLES 03/01/2007  . Hx of echocardiogram     done by [place]   . Shortness of breath   . Sleep apnea     sees Dr. Shelle Iron - sleep study- 06/2011, uses CPAP q night   . Diabetes mellitus without complication     , borderline , per pt. , per Dr. [name]  . H/O hiatal hernia   . Arthritis     avascular necrosis - of L  shoulder, knees- painful at time s   Past Surgical History  Procedure Laterality Date  . Stress cardiolite  11/01/2001  . Shoulder surgery  2005    Left  . Fx humerus      Left- 2005, required 2 surgeries   . Shoulder hemi-arthroplasty  04/11/2012    Procedure: SHOULDER HEMI-ARTHROPLASTY;  Surgeon: [name], MD;  Location: [facility] OR;  Service: Orthopedics;  Laterality: Left;  LEFT SHOULDER HEMIARTHROPLASTY    Family History  Problem Relation Age of Onset  . Cancer Neg Hx    History  Substance Use Topics  . Smoking status: Current Every Day Smoker -- 2.00 packs/day for 31 years    Types: Cigarettes  . Smokeless tobacco: Former Neurosurgeon  . Alcohol Use: No  employed Smokes 1 ppd  Review of Systems  Constitutional: Positive for fever and chills.  HENT: Positive for rhinorrhea. Negative for sore throat.   Respiratory: Positive for shortness of breath and wheezing.   Gastrointestinal: Negative for nausea, vomiting and diarrhea.  All other systems reviewed and are negative.    Allergies  Review of patient's allergies indicates no known allergies.  Home Medications   Current Outpatient Rx  Name  Route  Sig  Dispense  Refill  . albuterol (PROVENTIL,VENTOLIN) 90 MCG/ACT inhaler   Inhalation   Inhale 2  puffs into the lungs as needed for shortness of breath. For shortness of breath         . Chlorphen-Phenyleph-ASA (ALKA-SELTZER PLUS COLD PO)   Oral   Take 1 packet by mouth every 6 (six) hours as needed (cough/cold).         Marland Kitchen ibuprofen (ADVIL,MOTRIN) 200 MG tablet   Oral   Take 400 mg by mouth every 6 (six) hours as needed for fever.         Marland Kitchen losartan-hydrochlorothiazide (HYZAAR) 100-25 MG per tablet   Oral   Take 1 tablet by mouth daily with supper.   90 tablet   0   . lovastatin (MEVACOR) 20 MG tablet   Oral   Take 20 mg by mouth at bedtime.         . traMADol (ULTRAM) 50 MG tablet   Oral   Take 50 mg by mouth every 6 (six) hours as needed.           Triage Vitals: BP 163/102  Pulse 80  Temp(Src) 98.2 F (36.8 C) (Oral)  Resp 20  Ht 6\' 3"  (1.905 m)  Wt 340 lb (154.223 kg)  BMI 42.50 kg/m2  SpO2 94%  Vital signs normal except hypertension   Physical Exam  Nursing note and vitals reviewed. Constitutional: He is oriented to person, place, and time. He appears well-developed and well-nourished.  Non-toxic appearance. He does not appear ill. No distress.  HENT:  Head: Normocephalic and atraumatic.  Right Ear: External ear normal.  Left Ear: External ear normal.  Nose: Nose normal. No mucosal edema or rhinorrhea.  Mouth/Throat: Oropharynx is clear and moist and mucous membranes are normal. No dental abscesses or uvula swelling.  Tongue is dry.   Eyes: Conjunctivae and EOM are normal. Pupils are equal, round, and reactive to light.  Neck: Normal range of motion and full passive range of motion without pain. Neck supple.  Cardiovascular: Normal rate, regular rhythm and normal heart sounds.  Exam reveals no gallop and no friction rub.   No murmur heard. Pulmonary/Chest: Effort normal. No respiratory distress. He has wheezes. He has no rhonchi. He has no rales. He exhibits no tenderness and no crepitus.  Frequent coughing. Diminished breath sounds. Wheezing and rhonchi.   Abdominal: Soft. Normal appearance and bowel sounds are normal. He exhibits no distension. There is no tenderness. There is no rebound and no guarding.  Musculoskeletal: Normal range of motion. He exhibits no edema and no tenderness.  Moves all extremities well.   Neurological: He is alert and oriented to person, place, and time. He has normal strength. No cranial nerve deficit.  Skin: Skin is warm, dry and intact. No rash noted. No erythema. No pallor.  Psychiatric: He has a normal mood and affect. His speech is normal and behavior is normal. His mood appears not anxious.    ED Course  Procedures (including critical care time)  Medications  albuterol  (PROVENTIL,VENTOLIN) solution continuous neb (15 mg/hr Nebulization Given 04/30/13 1444)  ipratropium (ATROVENT) nebulizer solution 0.5 mg (0.5 mg Nebulization Given 04/30/13 1444)  predniSONE (DELTASONE) tablet 60 mg (60 mg Oral Given 04/30/13 1418)  amoxicillin (AMOXIL) capsule 500 mg (500 mg Oral Given 04/30/13 1419)    DIAGNOSTIC STUDIES: Oxygen Saturation is 94% on room air, adequate by my interpretation.    COORDINATION OF CARE: 2:13 PM- Discussed that x-ray results do not indicate pneumonia. Will order a breathing treatment, prednisone, and antibiotics to manage symptoms. Discussed treatment plan with patient  at bedside and patient verbalized agreement.   3:13 PM- Patient is still receiving his breathing treatment. He has improved breath sounds, but are still diminished. No wheezing, very rare rhonchi. Discussed treatment plan with patient at bedside and patient verbalized agreement.   Recheck at discharge, his lungs are clear, coughing almost gone. His pulse ox dropped from 94% to 91% when ambulating. Pt wanting to be discharged.   Dg Chest 2 View  04/30/2013   CLINICAL DATA:  Cough, congestion, and fever for 4 days with a history of chronic bronchitis and tobacco use  EXAM: CHEST  2 VIEW  COMPARISON:  Chest x-ray of Oct 07, 2012.  FINDINGS: The lungs are mildly hyperinflated with hemidiaphragm flattening. There is no focal infiltrate. Minimal prominence of the interstitial markings diffusely is demonstrated. The cardiac silhouette is normal in size. The pulmonary vascularity is not engorged. The mediastinum is normal in width. There is stable minimal blunting of the left lateral costophrenic angle. The observed portions of the bony structures exhibit no acute abnormalities.  IMPRESSION: There are findings consistent with COPD. There is no evidence of pneumonia nor CHF.   Electronically Signed   By: David  [place]   On: 04/30/2013 11:15      EKG Interpretation   None       MDM   1.  COPD with acute exacerbation   2. Bronchitis     Discharge Medication List as of 04/30/2013  4:34 PM    START taking these medications   Details  albuterol (PROVENTIL HFA;VENTOLIN HFA) 108 (90 BASE) MCG/ACT inhaler Inhale 2 puffs into the lungs every 4 (four) hours as needed for wheezing or shortness of breath., Starting 04/30/2013, Until Discontinued, Print    amoxicillin (AMOXIL) 500 MG capsule Take 1 capsule (500 mg total) by mouth 3 (three) times daily., Starting 04/30/2013, Until Discontinued, Print    dextromethorphan-guaiFENesin (MUCINEX DM) 30-600 MG per 12 hr tablet Take 1 tablet by mouth 2 (two) times daily., Starting 04/30/2013, Until Discontinued, Print    loratadine (CLARITIN) 10 MG tablet Take 1 tablet (10 mg total) by mouth daily., Starting 04/30/2013, Until Discontinued, Print    predniSONE (DELTASONE) 20 MG tablet Take 3 po QD x 2d starting tomorrow, then 2 po QD x 3d then 1 po QD x 3d, Print        Plan discharge  Devoria Albe, MD, FACEP     I personally performed the services described in this documentation, which was scribed in my presence. The recorded information has been reviewed and considered.  Devoria Albe, MD, FACEP     Ward Givens, MD 04/30/13 [phone]

## 2013-04-30 NOTE — ED Notes (Signed)
Patient ambulated in hallway.  o2 sat was at 94% and dropped to 91% while walking.  Patient did appear SOB but he stated that was more from being upset for being here so long.

## 2013-04-30 NOTE — ED Notes (Signed)
Note- pt took neb treatment off him self

## 2013-06-14 ENCOUNTER — Encounter: Payer: Self-pay | Admitting: Internal Medicine

## 2013-06-26 ENCOUNTER — Other Ambulatory Visit: Payer: Self-pay | Admitting: Endocrinology

## 2013-06-27 ENCOUNTER — Emergency Department (HOSPITAL_COMMUNITY)
Admission: EM | Admit: 2013-06-27 | Discharge: 2013-06-27 | Disposition: A | Discharge status: Home / Self Care | Payer: PRIVATE HEALTH INSURANCE | Attending: Emergency Medicine | Admitting: Emergency Medicine

## 2013-06-27 ENCOUNTER — Emergency Department (HOSPITAL_COMMUNITY): Payer: PRIVATE HEALTH INSURANCE

## 2013-06-27 ENCOUNTER — Encounter (HOSPITAL_COMMUNITY): Payer: Self-pay | Admitting: Emergency Medicine

## 2013-06-27 DIAGNOSIS — G4730 Sleep apnea, unspecified: Secondary | ICD-10-CM | POA: Insufficient documentation

## 2013-06-27 DIAGNOSIS — Z8719 Personal history of other diseases of the digestive system: Secondary | ICD-10-CM | POA: Insufficient documentation

## 2013-06-27 DIAGNOSIS — J40 Bronchitis, not specified as acute or chronic: Secondary | ICD-10-CM

## 2013-06-27 DIAGNOSIS — E119 Type 2 diabetes mellitus without complications: Secondary | ICD-10-CM | POA: Insufficient documentation

## 2013-06-27 DIAGNOSIS — M129 Arthropathy, unspecified: Secondary | ICD-10-CM | POA: Insufficient documentation

## 2013-06-27 DIAGNOSIS — R079 Chest pain, unspecified: Secondary | ICD-10-CM | POA: Insufficient documentation

## 2013-06-27 DIAGNOSIS — Z9981 Dependence on supplemental oxygen: Secondary | ICD-10-CM | POA: Insufficient documentation

## 2013-06-27 DIAGNOSIS — I10 Essential (primary) hypertension: Secondary | ICD-10-CM | POA: Insufficient documentation

## 2013-06-27 DIAGNOSIS — E785 Hyperlipidemia, unspecified: Secondary | ICD-10-CM | POA: Insufficient documentation

## 2013-06-27 DIAGNOSIS — Z792 Long term (current) use of antibiotics: Secondary | ICD-10-CM | POA: Insufficient documentation

## 2013-06-27 DIAGNOSIS — Z8619 Personal history of other infectious and parasitic diseases: Secondary | ICD-10-CM | POA: Insufficient documentation

## 2013-06-27 DIAGNOSIS — F17200 Nicotine dependence, unspecified, uncomplicated: Secondary | ICD-10-CM | POA: Insufficient documentation

## 2013-06-27 DIAGNOSIS — J45909 Unspecified asthma, uncomplicated: Secondary | ICD-10-CM | POA: Insufficient documentation

## 2013-06-27 LAB — POCT I-STAT, CHEM 8
BUN: 12 mg/dL (ref 6–23)
CHLORIDE: 100 meq/L (ref 96–112)
Calcium, Ion: 1.1 mmol/L — ABNORMAL LOW (ref 1.12–1.23)
Creatinine, Ser: 0.9 mg/dL (ref 0.50–1.35)
Glucose, Bld: 98 mg/dL (ref 70–99)
HCT: 51 % (ref 39.0–52.0)
Hemoglobin: 17.3 g/dL — ABNORMAL HIGH (ref 13.0–17.0)
Potassium: 4 mEq/L (ref 3.7–5.3)
Sodium: 139 mEq/L (ref 137–147)
TCO2: 25 mmol/L (ref 0–100)

## 2013-06-27 LAB — D-DIMER, QUANTITATIVE (NOT AT ARMC)

## 2013-06-27 MED ORDER — DOXYCYCLINE HYCLATE 100 MG PO CAPS: 100.0000 mg | ORAL_CAPSULE | Freq: Two times a day (BID) | ORAL | Status: DC

## 2013-06-27 MED ORDER — IPRATROPIUM-ALBUTEROL 0.5-2.5 (3) MG/3ML IN SOLN
3.0000 mL | Freq: Once | RESPIRATORY_TRACT | Status: AC
  Administered 2013-06-27: 3 mL via RESPIRATORY_TRACT
  Filled 2013-06-27: qty 3

## 2013-06-27 NOTE — Discharge Instructions (Signed)

## 2013-06-27 NOTE — ED Notes (Signed)
Patient c/o flu like symptoms-generalized body aches, fever, and cough. Patient reports thick yellow sputum. Per patient has chronic bronchitis in which he uses inhaler. Per patient has not used it in 2 weeks. Patient reports soreness in left ribcage and back from coughing. Denies taking any medications for fevers. Denies any vomiting or diarrhea.

## 2013-06-27 NOTE — ED Notes (Signed)
Pt c/o cough, chills, body aches, and sneezing since Saturday.

## 2013-06-27 NOTE — ED Provider Notes (Signed)
CSN: [id number]     Arrival date & time 06/27/13  1191 History  This chart was scribed for Glynn Octave, MD, by Yevette Edwards, ED Scribe. This patient was seen in room APA12/APA12 and the patient's care was started at 8:50 AM.   First MD Initiated Contact with Patient 06/27/13 0840     Chief Complaint  Patient presents with  . Cough  . Fever    The history is provided by the patient. No language interpreter was used.   HPI Comments: AZREAL STTHOMAS is a 58 y.o. male, with a h/o asthma and chronic bronchitis, who presents to the Emergency Department complaining of myalgia which has persisted for four days and has been associated with a cough productive of yellow sputum, rhinorrhea, sneezing, a mildly sore throat, chills, and a subjective fever. He also reports pain to his left ribs which he states is associated with episodes of coughing and deep inspiration. Additionally, [name] reports pain to his back with episodes of coughing.  To treat his symptoms, the pt used Alka Selzer Cough and Cold and one tramadol without resolution; he has not had a breathing treatment. He denies any chest pain, SOB, abdomen, nausea, emesis, or leg swelling. He has been exposed to sick contacts. Five months ago, he had an influenza vaccination. The pt denies a h/o COPD. [name] is a current smoker.   Past Medical History  Diagnosis Date  . ALLERGIC RHINITIS 03/01/2007  . ASTHMA 03/01/2007  . ERECTILE DYSFUNCTION 03/01/2007  . HYPERGLYCEMIA 03/01/2007  . HYPERLIPIDEMIA 03/01/2007  . HYPERTENSION 03/01/2007  . SHINGLES 03/01/2007  . Hx of echocardiogram     done by [facility]   . Shortness of breath   . Sleep apnea     sees Dr. Shelle Iron - sleep study- 06/2011, uses CPAP q night   . Diabetes mellitus without complication     , borderline , per pt. , per Dr. [name]  . H/O hiatal hernia   . Arthritis     avascular necrosis - of L shoulder, knees- painful at time s   Past Surgical History  Procedure Laterality  Date  . Stress cardiolite  11/01/2001  . Shoulder surgery  2005    Left  . Fx humerus      Left- 2005, required 2 surgeries   . Shoulder hemi-arthroplasty  04/11/2012    Procedure: SHOULDER HEMI-ARTHROPLASTY;  Surgeon: [name], MD;  Location: [facility] OR;  Service: Orthopedics;  Laterality: Left;  LEFT SHOULDER HEMIARTHROPLASTY    Family History  Problem Relation Age of Onset  . Cancer Neg Hx    History  Substance Use Topics  . Smoking status: Current Every Day Smoker -- 2.00 packs/day for 31 years    Types: Cigarettes  . Smokeless tobacco: Former Neurosurgeon  . Alcohol Use: No    Review of Systems  A complete 10 system review of systems was obtained, and all systems were negative except where indicated in the HPI and PE.   Allergies  Review of patient's allergies indicates no known allergies.  Home Medications   Current Outpatient Rx  Name  Route  Sig  Dispense  Refill  . albuterol (PROVENTIL,VENTOLIN) 90 MCG/ACT inhaler   Inhalation   Inhale 2 puffs into the lungs as needed for shortness of breath. For shortness of breath         . ibuprofen (ADVIL,MOTRIN) 200 MG tablet   Oral   Take 400 mg by mouth every 6 (six) hours as  needed for fever or moderate pain.          Marland Kitchen losartan-hydrochlorothiazide (HYZAAR) 100-25 MG per tablet      TAKE 1 TABLET BY MOUTH DAILY WITH SUPPER.   90 tablet   0   . lovastatin (MEVACOR) 20 MG tablet   Oral   Take 20 mg by mouth at bedtime.         . traMADol (ULTRAM) 50 MG tablet   Oral   Take 50 mg by mouth every 6 (six) hours as needed.         . doxycycline (VIBRAMYCIN) 100 MG capsule   Oral   Take 1 capsule (100 mg total) by mouth 2 (two) times daily.   20 capsule   0    Triage Vitals:  BP 131/84  Pulse 82  Temp(Src) 98.2 F (36.8 C) (Oral)  Resp 24  Ht 6\' 3"  (1.905 m)  Wt 340 lb (154.223 kg)  BMI 42.50 kg/m2  SpO2 96%  Physical Exam  Nursing note and vitals reviewed. Constitutional: He is oriented to  person, place, and time. He appears well-developed and well-nourished. No distress.  HENT:  Head: Normocephalic and atraumatic.  Right Ear: External ear normal.  Left Ear: External ear normal.  Mouth/Throat: Oropharynx is clear and moist. No oropharyngeal exudate.  Nasal congestion.  Eyes: Conjunctivae and EOM are normal. Pupils are equal, round, and reactive to light.  Neck: Neck supple. No tracheal deviation present.  No meningismus.   Cardiovascular: Normal rate, regular rhythm and normal heart sounds.   No murmur heard. Pulmonary/Chest: Effort normal and breath sounds normal. No respiratory distress. He has no rales.  Abdominal: Soft. There is no tenderness.  Musculoskeletal: Normal range of motion. He exhibits tenderness. He exhibits no edema.  Left rib tenderness to palpation.   Neurological: He is alert and oriented to person, place, and time. No cranial nerve deficit. He exhibits normal muscle tone. Coordination normal.  Skin: Skin is warm and dry. No rash noted.  Psychiatric: He has a normal mood and affect. His behavior is normal.    ED Course  Procedures (including critical care time)  DIAGNOSTIC STUDIES: Oxygen Saturation is 96% on room air, normal by my interpretation.    COORDINATION OF CARE:  8:55 AM- Discussed treatment plan with patient, which includes a chest x-ray, blood work, and a breathing treatment, and the patient agreed to the plan.   10:04 AM- Rechecked pt. Informed him of the results. The pt was improved after the breathing treatment. Encouraged the pt to cease smoking. Will prescribe pt an antibiotic.   Labs Review Labs Reviewed  POCT I-STAT, CHEM 8 - Abnormal; Notable for the following:    Calcium, Ion 1.10 (*)    Hemoglobin 17.3 (*)    All other components within normal limits  D-DIMER, QUANTITATIVE   Imaging Review Dg Chest 2 View  06/27/2013   CLINICAL DATA:  Cough, congestion  EXAM: CHEST  2 VIEW  COMPARISON:  DG CHEST 2 VIEW dated 04/30/2013   FINDINGS: The cardiac silhouette is enlarged. There is flattening of the hemidiaphragms. Blunting of the left costophrenic angle is appreciated. The right costophrenic angle is not viewed on the study. No focal regions of consolidation or focal infiltrates. The osseous structures unremarkable. Patient is status post left shoulder arthroplasty.  IMPRESSION: COPD. Scar versus small effusion left costophrenic angle. No focal regions of consolidation or focal infiltrates.   Electronically Signed   By: Salome Holmes M.D.  On: 06/27/2013 09:45    EKG Interpretation    Date/Time:  Tuesday June 27 2013 09:06:45 EST Ventricular Rate:  81 PR Interval:  162 QRS Duration: 90 QT Interval:  402 QTC Calculation: 466 R Axis:   50 Text Interpretation:  Sinus rhythm with sinus arrhythmia with frequent Premature ventricular complexes Otherwise normal ECG No significant change was found Confirmed by Manus GunningANCOUR  MD, Sharla Tankard ([id number]) on 06/27/2013 9:13:58 AM            MDM   1. Bronchitis    4 days of flulike symptoms including cough, congestion, body aches and subjective chills. Left-sided rib pain from coughing. Denies any chest pain, abdominal pain, nausea vomiting. Has had sick contacts.  CXR, EKG. D-dimer negative.  EKG unchanged. Chest x-ray is negative for infiltrate. No evidence of rib fracture or pneumothorax.  Supportive care for URI and viral syndrome. Discussed antipyretics and broncho dilators at home.  BP 131/84  Pulse 82  Temp(Src) 98.2 F (36.8 C) (Oral)  Resp 24  Ht 6\' 3"  (1.905 m)  Wt 340 lb (154.223 kg)  BMI 42.50 kg/m2  SpO2 96%   I personally performed the services described in this documentation, which was scribed in my presence. The recorded information has been reviewed and is accurate.   Glynn OctaveStephen Adelaine Roppolo, MD 06/27/13 [phone]

## 2013-08-28 ENCOUNTER — Encounter (HOSPITAL_COMMUNITY): Payer: Self-pay | Admitting: Emergency Medicine

## 2013-08-28 ENCOUNTER — Emergency Department (HOSPITAL_COMMUNITY): Payer: PRIVATE HEALTH INSURANCE

## 2013-08-28 ENCOUNTER — Emergency Department (HOSPITAL_COMMUNITY)
Admission: EM | Admit: 2013-08-28 | Discharge: 2013-08-28 | Disposition: A | Discharge status: Home / Self Care | Payer: PRIVATE HEALTH INSURANCE | Attending: Emergency Medicine | Admitting: Emergency Medicine

## 2013-08-28 DIAGNOSIS — L988 Other specified disorders of the skin and subcutaneous tissue: Secondary | ICD-10-CM | POA: Insufficient documentation

## 2013-08-28 DIAGNOSIS — Z79899 Other long term (current) drug therapy: Secondary | ICD-10-CM | POA: Insufficient documentation

## 2013-08-28 DIAGNOSIS — E119 Type 2 diabetes mellitus without complications: Secondary | ICD-10-CM | POA: Insufficient documentation

## 2013-08-28 DIAGNOSIS — S90829A Blister (nonthermal), unspecified foot, initial encounter: Principal | ICD-10-CM

## 2013-08-28 DIAGNOSIS — G4730 Sleep apnea, unspecified: Secondary | ICD-10-CM | POA: Insufficient documentation

## 2013-08-28 DIAGNOSIS — L089 Local infection of the skin and subcutaneous tissue, unspecified: Secondary | ICD-10-CM

## 2013-08-28 DIAGNOSIS — I10 Essential (primary) hypertension: Secondary | ICD-10-CM | POA: Insufficient documentation

## 2013-08-28 DIAGNOSIS — Z8739 Personal history of other diseases of the musculoskeletal system and connective tissue: Secondary | ICD-10-CM | POA: Insufficient documentation

## 2013-08-28 DIAGNOSIS — W208XXA Other cause of strike by thrown, projected or falling object, initial encounter: Secondary | ICD-10-CM | POA: Insufficient documentation

## 2013-08-28 DIAGNOSIS — S90426A Blister (nonthermal), unspecified lesser toe(s), initial encounter: Secondary | ICD-10-CM | POA: Insufficient documentation

## 2013-08-28 DIAGNOSIS — Z8719 Personal history of other diseases of the digestive system: Secondary | ICD-10-CM | POA: Insufficient documentation

## 2013-08-28 DIAGNOSIS — F17200 Nicotine dependence, unspecified, uncomplicated: Secondary | ICD-10-CM | POA: Insufficient documentation

## 2013-08-28 DIAGNOSIS — E785 Hyperlipidemia, unspecified: Secondary | ICD-10-CM | POA: Insufficient documentation

## 2013-08-28 DIAGNOSIS — Z87448 Personal history of other diseases of urinary system: Secondary | ICD-10-CM | POA: Insufficient documentation

## 2013-08-28 DIAGNOSIS — Z9981 Dependence on supplemental oxygen: Secondary | ICD-10-CM | POA: Insufficient documentation

## 2013-08-28 DIAGNOSIS — J45909 Unspecified asthma, uncomplicated: Secondary | ICD-10-CM | POA: Insufficient documentation

## 2013-08-28 DIAGNOSIS — Y939 Activity, unspecified: Secondary | ICD-10-CM | POA: Insufficient documentation

## 2013-08-28 DIAGNOSIS — Y929 Unspecified place or not applicable: Secondary | ICD-10-CM | POA: Insufficient documentation

## 2013-08-28 DIAGNOSIS — Z8619 Personal history of other infectious and parasitic diseases: Secondary | ICD-10-CM | POA: Insufficient documentation

## 2013-08-28 MED ORDER — SULFAMETHOXAZOLE-TRIMETHOPRIM 800-160 MG PO TABS: 1.0000 | ORAL_TABLET | Freq: Two times a day (BID) | ORAL | Status: DC

## 2013-08-28 MED ORDER — HYDROCODONE-ACETAMINOPHEN 5-325 MG PO TABS: 1.0000 | ORAL_TABLET | ORAL | Status: DC | PRN

## 2013-08-28 MED ORDER — LIDOCAINE HCL (PF) 2 % IJ SOLN
INTRAMUSCULAR | Status: AC
  Filled 2013-08-28: qty 10

## 2013-08-28 MED ORDER — SULFAMETHOXAZOLE-TMP DS 800-160 MG PO TABS
1.0000 | ORAL_TABLET | Freq: Once | ORAL | Status: AC
  Administered 2013-08-28: 1 via ORAL
  Filled 2013-08-28: qty 1

## 2013-08-28 NOTE — ED Notes (Signed)
Pt reports noticing blister to left big toe on Friday, attempted to pop the blister with a needle on Friday.  Drainage of white/clear liquid reported.  Continues to get worse.  Constant pain.  Unable to bend toes.  Swelling and redness noted to toes.

## 2013-08-28 NOTE — ED Provider Notes (Signed)
CSN: [id number]     Arrival date & time 08/28/13  0913 History  This chart was scribed for non-physician practitioner Burgess Amor, PA-C working with Glynn Octave, MD by Dorothey Baseman, ED Scribe. This patient was seen in room APA19/APA19 and the patient's care was started at 9:40 AM.    Chief Complaint  Patient presents with  . Foot Problem   The history is provided by the patient. No language interpreter was used.   HPI Comments: [name] is a 58 y.o. Male with a history of arthritis and shingles who presents to the Emergency Department complaining of a constant pain with associated swelling and erythema to the left toes onset about 3 days ago. He reports that 4 days ago he dropped a 8x4x4 block of wood (weighing about 100 pounds) on his left foot, but that he was wearing steel-toed boots and did not have any pain or other symptoms at that time. Patient reports that he then noticed a blister to the bottom, left, great toe 3 days ago, which he attempted to pop with a needle, causing clear-colored drainage to be expelled. He states that the pain has been progressively worsening since then and is exacerbated with walking/bearing weight. Patient reports that his last tetanus vaccination 3 years ago and is UTD. He denies any allergies to medications. Patient also has a history of hyperlipidemia, HTN, and asthma. Patient denies history of DM.   Past Medical History  Diagnosis Date  . ALLERGIC RHINITIS 03/01/2007  . ASTHMA 03/01/2007  . ERECTILE DYSFUNCTION 03/01/2007  . HYPERGLYCEMIA 03/01/2007  . HYPERLIPIDEMIA 03/01/2007  . HYPERTENSION 03/01/2007  . SHINGLES 03/01/2007  . Hx of echocardiogram     done by [place]   . Shortness of breath   . Sleep apnea     sees Dr. Shelle Iron - sleep study- 06/2011, uses CPAP q night   . Diabetes mellitus without complication     , borderline , per pt. , per Dr. [name]  . H/O hiatal hernia   . Arthritis     avascular necrosis - of L shoulder, knees- painful at  time s   Past Surgical History  Procedure Laterality Date  . Stress cardiolite  11/01/2001  . Shoulder surgery  2005    Left  . Fx humerus      Left- 2005, required 2 surgeries   . Shoulder hemi-arthroplasty  04/11/2012    Procedure: SHOULDER HEMI-ARTHROPLASTY;  Surgeon: [name], MD;  Location: [facility] OR;  Service: Orthopedics;  Laterality: Left;  LEFT SHOULDER HEMIARTHROPLASTY    Family History  Problem Relation Age of Onset  . Cancer Neg Hx    History  Substance Use Topics  . Smoking status: Current Every Day Smoker -- 1.00 packs/day for 31 years    Types: Cigarettes  . Smokeless tobacco: Former Neurosurgeon  . Alcohol Use: No    Review of Systems  Constitutional: Negative for fever.  Musculoskeletal: Positive for arthralgias and joint swelling. Negative for myalgias.  Skin: Positive for color change (erythema).  Neurological: Negative for weakness and numbness.   Allergies  Review of patient's allergies indicates no known allergies.  Home Medications   Current Outpatient Rx  Name  Route  Sig  Dispense  Refill  . albuterol (PROVENTIL,VENTOLIN) 90 MCG/ACT inhaler   Inhalation   Inhale 2 puffs into the lungs as needed for shortness of breath. For shortness of breath         . losartan-hydrochlorothiazide (HYZAAR) 100-25 MG per tablet  TAKE 1 TABLET BY MOUTH DAILY WITH SUPPER.   90 tablet   0   . lovastatin (MEVACOR) 20 MG tablet   Oral   Take 20 mg by mouth at bedtime.         . traMADol (ULTRAM) 50 MG tablet   Oral   Take 50 mg by mouth every 6 (six) hours as needed.         Marland Kitchen HYDROcodone-acetaminophen (NORCO/VICODIN) 5-325 MG per tablet   Oral   Take 1 tablet by mouth every 4 (four) hours as needed.   20 tablet   0   . sulfamethoxazole-trimethoprim (SEPTRA DS) 800-160 MG per tablet   Oral   Take 1 tablet by mouth every 12 (twelve) hours.   20 tablet   0    Triage Vitals: BP 141/81  Pulse 81  Temp(Src) 97.5 F (36.4 C) (Oral)   Resp 18  Ht 6\' 3"  (1.905 m)  Wt 340 lb (154.223 kg)  BMI 42.50 kg/m2  SpO2 96%  Physical Exam  Constitutional: He appears well-developed and well-nourished.  HENT:  Head: Atraumatic.  Neck: Normal range of motion.  Cardiovascular: Intact distal pulses.   Pulses:      Dorsalis pedis pulses are 2+ on the right side, and 2+ on the left side.  Pulses equal bilaterally  Musculoskeletal: He exhibits tenderness.  No metatarsal tenderness to palpation. Tenderness to the plantar MTP of the left, great toe around the bulla.   Neurological: He is alert. He has normal strength. He displays normal reflexes. No sensory deficit.  Skin: Skin is warm and dry. There is erythema.  Bulla, which appears grey in color, at the plantar MTP joint. Mild surrounding erythema, but without increased warmth. Full DP pulse. No red streaking.   He does have mild edema of his great toe and 2nd and 3rd toes.  Psychiatric: He has a normal mood and affect.    ED Course  Procedures (including critical care time)  DIAGNOSTIC STUDIES: Oxygen Saturation is 96% on room air, normal by my interpretation.    COORDINATION OF CARE: 9:44 AM- Will perform an incision and drainage of the bulla. Discussed treatment plan with patient at bedside and patient verbalized agreement.   10:02 AM- Discussed that concern for fracture or underlying infection is low, but will order an x-ray of the left foot to rule out. Will start patient on a course of Bactrim. Discussed treatment plan with patient at bedside and patient verbalized agreement.   11:45 AM- Discussed that x-ray results were negative. Will discharge patient with a post op shoe, Vicodin, and Septra to manage symptoms. Advised patient to follow up with his PCP in 2 days if not improving, sooner for any worsened sx including increased pain,  Redness, swelling, red streaking. Return precautions given. Discussed treatment plan with patient at bedside and patient verbalized agreement.  Encouraged warm epsom salt soaks   INCISION AND DRAINAGE PROCEDURE NOTE: Patient identification was confirmed and verbal consent was obtained. This procedure was performed by Burgess Amor, PA-C at 9:52 AM. Site: bulla to the plantar aspect of left, great toe Sterile procedures observed Needle size: 25 Anesthetic used (type and amt): 2% lidocaine without epinephrine, 3 cc  - digital block Blade size: 11 Drainage: moderate amount of serosanguinous fluid, trace of purulence. Complexity: simple Site anesthetized, incision made over site, wound drained and explored loculations, rinsed with copious amounts of normal saline, covered with dry, sterile dressing.  Pt tolerated procedure well without complications.  Instructions for care discussed verbally and pt provided with additional written instructions for homecare and f/u.   Labs Review Labs Reviewed - No data to display  Imaging Review Dg Foot Complete Left  08/28/2013   CLINICAL DATA:  Fall, laceration  EXAM: LEFT FOOT - COMPLETE 3+ VIEW  COMPARISON:  None.  FINDINGS: There is no evidence of fracture or dislocation. There is no evidence of arthropathy or other focal bone abnormality. No radiopaque foreign body. Plantar spur of calcaneus.  IMPRESSION: Negative.  Plantar spur of calcaneus.   Electronically Signed   By: Natasha MeadLiviu  Pop M.D.   On: 08/28/2013 11:21     EKG Interpretation None      MDM   Final diagnoses:  Blister of foot with infection     I personally performed the services described in this documentation, which was scribed in my presence. The recorded information has been reviewed and is accurate.    Burgess AmorJulie Herbert Marken, PA-C 08/28/13 2143

## 2013-08-28 NOTE — Discharge Instructions (Signed)
Start warm epsom salt soaks for 15 minutes twice daily,  In between keep your wound clean, covered and dry.  You may take the hydrocodone prescribed for pain relief.  This will make you drowsy - do not drive within 4 hours of taking this medication.  Take your antibiotic as instructed,  Your next tablet tonight.  Return here or see your doctor for a recheck if you develop any worsened pain,  Swelling or spreading redness, or your symptoms are not improving over the next 2 days.

## 2013-08-28 NOTE — ED Notes (Signed)
PA at bedside for evaluation

## 2013-08-28 NOTE — ED Notes (Signed)
Patient transported to X-ray 

## 2013-08-29 NOTE — ED Provider Notes (Signed)
Medical screening examination/treatment/procedure(s) were performed by non-physician practitioner and as supervising physician I was immediately available for consultation/collaboration.   EKG Interpretation None       Cleave Ternes, MD 08/29/13 0924 

## 2013-09-21 ENCOUNTER — Other Ambulatory Visit: Payer: Self-pay | Admitting: Endocrinology

## 2013-09-21 NOTE — Telephone Encounter (Signed)
Rx for tramadol faxed to pharmacy

## 2013-09-21 NOTE — Telephone Encounter (Signed)
Please advise if ok to refill. 

## 2013-10-31 ENCOUNTER — Encounter (HOSPITAL_COMMUNITY): Payer: Self-pay | Admitting: Emergency Medicine

## 2013-10-31 ENCOUNTER — Emergency Department (HOSPITAL_COMMUNITY): Payer: PRIVATE HEALTH INSURANCE

## 2013-10-31 ENCOUNTER — Emergency Department (HOSPITAL_COMMUNITY)
Admission: EM | Admit: 2013-10-31 | Discharge: 2013-10-31 | Disposition: A | Discharge status: Home / Self Care | Payer: PRIVATE HEALTH INSURANCE | Attending: Emergency Medicine | Admitting: Emergency Medicine

## 2013-10-31 DIAGNOSIS — F17200 Nicotine dependence, unspecified, uncomplicated: Secondary | ICD-10-CM | POA: Insufficient documentation

## 2013-10-31 DIAGNOSIS — M25512 Pain in left shoulder: Secondary | ICD-10-CM

## 2013-10-31 DIAGNOSIS — Z79899 Other long term (current) drug therapy: Secondary | ICD-10-CM | POA: Insufficient documentation

## 2013-10-31 DIAGNOSIS — S46909A Unspecified injury of unspecified muscle, fascia and tendon at shoulder and upper arm level, unspecified arm, initial encounter: Secondary | ICD-10-CM | POA: Insufficient documentation

## 2013-10-31 DIAGNOSIS — N529 Male erectile dysfunction, unspecified: Secondary | ICD-10-CM | POA: Insufficient documentation

## 2013-10-31 DIAGNOSIS — I10 Essential (primary) hypertension: Secondary | ICD-10-CM | POA: Insufficient documentation

## 2013-10-31 DIAGNOSIS — Z8619 Personal history of other infectious and parasitic diseases: Secondary | ICD-10-CM | POA: Insufficient documentation

## 2013-10-31 DIAGNOSIS — E785 Hyperlipidemia, unspecified: Secondary | ICD-10-CM | POA: Insufficient documentation

## 2013-10-31 DIAGNOSIS — E119 Type 2 diabetes mellitus without complications: Secondary | ICD-10-CM | POA: Insufficient documentation

## 2013-10-31 DIAGNOSIS — G4730 Sleep apnea, unspecified: Secondary | ICD-10-CM | POA: Insufficient documentation

## 2013-10-31 DIAGNOSIS — J45909 Unspecified asthma, uncomplicated: Secondary | ICD-10-CM | POA: Insufficient documentation

## 2013-10-31 DIAGNOSIS — Y9389 Activity, other specified: Secondary | ICD-10-CM | POA: Insufficient documentation

## 2013-10-31 DIAGNOSIS — S4980XA Other specified injuries of shoulder and upper arm, unspecified arm, initial encounter: Secondary | ICD-10-CM | POA: Insufficient documentation

## 2013-10-31 DIAGNOSIS — M129 Arthropathy, unspecified: Secondary | ICD-10-CM | POA: Insufficient documentation

## 2013-10-31 DIAGNOSIS — Y92410 Unspecified street and highway as the place of occurrence of the external cause: Secondary | ICD-10-CM | POA: Insufficient documentation

## 2013-10-31 MED ORDER — OXYCODONE-ACETAMINOPHEN 5-325 MG PO TABS: 1.0000 | ORAL_TABLET | ORAL | Status: DC | PRN

## 2013-10-31 MED ORDER — OXYCODONE-ACETAMINOPHEN 5-325 MG PO TABS
2.0000 | ORAL_TABLET | Freq: Once | ORAL | Status: AC
  Administered 2013-10-31: 2 via ORAL
  Filled 2013-10-31: qty 2

## 2013-10-31 MED ORDER — NAPROXEN 500 MG PO TABS: 500.0000 mg | ORAL_TABLET | Freq: Two times a day (BID) | ORAL | Status: DC

## 2013-10-31 NOTE — Discharge Instructions (Signed)
   Shoulder Pain The shoulder is the joint that connects your arms to your body. The bones that form the shoulder joint include the upper arm bone (humerus), the shoulder blade (scapula), and the collarbone (clavicle). The top of the humerus is shaped like a ball and fits into a rather flat socket on the scapula (glenoid cavity). A combination of muscles and strong, fibrous tissues that connect muscles to bones (tendons) support your shoulder joint and hold the ball in the socket. Small, fluid-filled sacs (bursae) are located in different areas of the joint. They act as cushions between the bones and the overlying soft tissues and help reduce friction between the gliding tendons and the bone as you move your arm. Your shoulder joint allows a wide range of motion in your arm. This range of motion allows you to do things like scratch your back or throw a ball. However, this range of motion also makes your shoulder more prone to pain from overuse and injury. Causes of shoulder pain can originate from both injury and overuse and usually can be grouped in the following four categories:  Redness, swelling, and pain (inflammation) of the tendon (tendinitis) or the bursae (bursitis).  Instability, such as a dislocation of the joint.  Inflammation of the joint (arthritis).  Broken bone (fracture). HOME CARE INSTRUCTIONS   Apply ice to the sore area.  Put ice in a plastic bag.  Place a towel between your skin and the bag.  Leave the ice on for 15-20 minutes, 03-04 times per day for the first 2 days.  Stop using cold packs if they do not help with the pain.  If you have a shoulder sling or immobilizer, wear it as long as your caregiver instructs. Only remove it to shower or bathe. Move your arm as little as possible, but keep your hand moving to prevent swelling.  Squeeze a soft ball or foam pad as much as possible to help prevent swelling.  Only take over-the-counter or prescription medicines for  pain, discomfort, or fever as directed by your caregiver. SEEK MEDICAL CARE IF:   Your shoulder pain increases, or new pain develops in your arm, hand, or fingers.  Your hand or fingers become cold and numb.  Your pain is not relieved with medicines. SEEK IMMEDIATE MEDICAL CARE IF:   Your arm, hand, or fingers are numb or tingling.  Your arm, hand, or fingers are significantly swollen or turn white or blue. MAKE SURE YOU:   Understand these instructions.  Will watch your condition.  Will get help right away if you are not doing well or get worse. Document Released: 02/18/2005 Document Revised: 02/03/2012 Document Reviewed: 04/25/2011 [employer] Patient Information 2014 [employer]  
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
## 2013-11-03 NOTE — ED Provider Notes (Signed)
Medical screening examination/treatment/procedure(s) were performed by non-physician practitioner and as supervising physician I was immediately available for consultation/collaboration.   EKG Interpretation None       Donnetta HutchingBrian Ashika Apuzzo, MD 11/03/13 1950

## 2013-11-23 ENCOUNTER — Other Ambulatory Visit: Payer: Self-pay | Admitting: Endocrinology

## 2013-11-23 NOTE — Telephone Encounter (Signed)
Please advise if ok to refill. Med is listed under Historical provider.  Thanks!

## 2013-12-19 ENCOUNTER — Other Ambulatory Visit: Payer: Self-pay | Admitting: Endocrinology

## 2014-03-19 ENCOUNTER — Other Ambulatory Visit: Payer: Self-pay | Admitting: Endocrinology

## 2014-03-20 ENCOUNTER — Other Ambulatory Visit: Payer: Self-pay | Admitting: Endocrinology

## 2014-03-20 NOTE — Telephone Encounter (Signed)
Please refill hyzaar x 1 Ov is due

## 2014-03-20 NOTE — Telephone Encounter (Signed)
Please advise if ok to refill. Pt was last seem 03/28/2013. Thanks!

## 2014-03-20 NOTE — Telephone Encounter (Signed)
rx sent for 1 refill.

## 2014-03-26 ENCOUNTER — Other Ambulatory Visit: Payer: Self-pay | Admitting: Endocrinology

## 2014-03-26 ENCOUNTER — Other Ambulatory Visit: Payer: Self-pay

## 2014-03-26 LAB — CBC WITH DIFFERENTIAL/PLATELET
Basophils Absolute: 0 10*3/uL (ref 0.0–0.1)
Basophils Relative: 0 % (ref 0–1)
EOS ABS: 0.2 10*3/uL (ref 0.0–0.7)
EOS PCT: 2 % (ref 0–5)
HCT: 46.2 % (ref 39.0–52.0)
HEMOGLOBIN: 16.3 g/dL (ref 13.0–17.0)
LYMPHS ABS: 2.3 10*3/uL (ref 0.7–4.0)
Lymphocytes Relative: 25 % (ref 12–46)
MCH: 30.6 pg (ref 26.0–34.0)
MCHC: 35.3 g/dL (ref 30.0–36.0)
MCV: 86.8 fL (ref 78.0–100.0)
MONOS PCT: 6 % (ref 3–12)
Monocytes Absolute: 0.5 10*3/uL (ref 0.1–1.0)
Neutro Abs: 6.1 10*3/uL (ref 1.7–7.7)
Neutrophils Relative %: 67 % (ref 43–77)
Platelets: 206 10*3/uL (ref 150–400)
RBC: 5.32 MIL/uL (ref 4.22–5.81)
RDW: 13.5 % (ref 11.5–15.5)
WBC: 9.1 10*3/uL (ref 4.0–10.5)

## 2014-03-26 LAB — LIPID PANEL
CHOL/HDL RATIO: 3.2 ratio
Cholesterol: 127 mg/dL (ref 0–200)
HDL: 40 mg/dL (ref >39)
LDL CALC: 67 mg/dL (ref 0–99)
Triglycerides: 99 mg/dL (ref <150)
VLDL: 20 mg/dL (ref 0–40)

## 2014-03-26 LAB — HEPATIC FUNCTION PANEL
ALK PHOS: 55 U/L (ref 39–117)
ALT: 17 U/L (ref 0–53)
AST: 14 U/L (ref 0–37)
Albumin: 3.9 g/dL (ref 3.5–5.2)
BILIRUBIN TOTAL: 0.5 mg/dL (ref 0.2–1.2)
Bilirubin, Direct: 0.1 mg/dL (ref 0.0–0.3)
Indirect Bilirubin: 0.4 mg/dL (ref 0.2–1.2)
Total Protein: 6.6 g/dL (ref 6.0–8.3)

## 2014-03-26 LAB — BASIC METABOLIC PANEL
BUN: 10 mg/dL (ref 6–23)
CALCIUM: 9 mg/dL (ref 8.4–10.5)
CHLORIDE: 100 meq/L (ref 96–112)
CO2: 27 meq/L (ref 19–32)
CREATININE: 0.84 mg/dL (ref 0.50–1.35)
Glucose, Bld: 90 mg/dL (ref 70–99)
Potassium: 4 mEq/L (ref 3.5–5.3)
Sodium: 141 mEq/L (ref 135–145)

## 2014-03-26 LAB — TSH: TSH: 1.209 u[IU]/mL (ref 0.350–4.500)

## 2014-03-26 LAB — LDL CHOLESTEROL, DIRECT: Direct LDL: 80 mg/dL

## 2014-03-27 LAB — URINALYSIS, ROUTINE W REFLEX MICROSCOPIC
Bilirubin Urine: NEGATIVE
Glucose, UA: NEGATIVE mg/dL
Ketones, ur: NEGATIVE mg/dL
LEUKOCYTES UA: NEGATIVE
Nitrite: NEGATIVE
PROTEIN: NEGATIVE mg/dL
Specific Gravity, Urine: 1.023 (ref 1.005–1.030)
UROBILINOGEN UA: 1 mg/dL (ref 0.0–1.0)
pH: 6.5 (ref 5.0–8.0)

## 2014-03-27 LAB — PSA: PSA: 0.45 ng/mL (ref <=4.00)

## 2014-03-27 LAB — URINALYSIS, MICROSCOPIC ONLY
Bacteria, UA: NONE SEEN
CRYSTALS: NONE SEEN
Casts: NONE SEEN
SQUAMOUS EPITHELIAL / LPF: NONE SEEN

## 2014-03-30 ENCOUNTER — Encounter: Payer: Self-pay | Admitting: Endocrinology

## 2014-03-30 ENCOUNTER — Ambulatory Visit (INDEPENDENT_AMBULATORY_CARE_PROVIDER_SITE_OTHER): Payer: PRIVATE HEALTH INSURANCE | Admitting: Endocrinology

## 2014-03-30 VITALS — BP 130/88 | HR 84 | Temp 97.9°F | Ht 76.0 in | Wt 359.6 lb

## 2014-03-30 DIAGNOSIS — M25571 Pain in right ankle and joints of right foot: Secondary | ICD-10-CM

## 2014-03-30 DIAGNOSIS — M25579 Pain in unspecified ankle and joints of unspecified foot: Secondary | ICD-10-CM | POA: Insufficient documentation

## 2014-03-30 DIAGNOSIS — Z0189 Encounter for other specified special examinations: Secondary | ICD-10-CM

## 2014-03-30 DIAGNOSIS — Z0000 Encounter for general adult medical examination without abnormal findings: Secondary | ICD-10-CM | POA: Diagnosis not present

## 2014-03-30 DIAGNOSIS — Z23 Encounter for immunization: Secondary | ICD-10-CM | POA: Diagnosis not present

## 2014-03-30 MED ORDER — BUSPIRONE HCL 30 MG PO TABS: 30.0000 mg | ORAL_TABLET | Freq: Two times a day (BID) | ORAL | Status: DC

## 2014-03-30 MED ORDER — ALBUTEROL SULFATE HFA 108 (90 BASE) MCG/ACT IN AERS: 1.0000 | INHALATION_SPRAY | Freq: Four times a day (QID) | RESPIRATORY_TRACT | Status: DC | PRN

## 2014-03-30 NOTE — Progress Notes (Signed)
Subjective:    Patient ID: [name], male    DOB: 1956/03/01, 58 y.o.   MRN: [id number]  HPI Pt is here for regular wellness examination, and is feeling pretty well in general, and says chronic med probs are stable, except as noted below Past Medical History  Diagnosis Date  . ALLERGIC RHINITIS 03/01/2007  . ASTHMA 03/01/2007  . ERECTILE DYSFUNCTION 03/01/2007  . HYPERGLYCEMIA 03/01/2007  . HYPERLIPIDEMIA 03/01/2007  . HYPERTENSION 03/01/2007  . SHINGLES 03/01/2007  . Hx of echocardiogram     done by [place]   . Shortness of breath   . Sleep apnea     sees Dr. Shelle Ironlance - sleep study- 06/2011, uses CPAP q night   . Diabetes mellitus without complication     , borderline , per pt. , per Dr. Everardo AllEllison  . H/O hiatal hernia   . Arthritis     avascular necrosis - of L shoulder, knees- painful at time s    Past Surgical History  Procedure Laterality Date  . Stress cardiolite  11/01/2001  . Shoulder surgery  2005    Left  . Fx humerus      Left- 2005, required 2 surgeries   . Shoulder hemi-arthroplasty  04/11/2012    Procedure: SHOULDER HEMI-ARTHROPLASTY;  Surgeon: Mable ParisJustin William Chandler, MD;  Location: [facility] OR;  Service: Orthopedics;  Laterality: Left;  LEFT SHOULDER HEMIARTHROPLASTY     History   Social History  . Marital Status: Married    Spouse Name: N/A    Number of Children: 2  . 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 Smoking status: Current Every Day Smoker -- 1.00 packs/day for 31 years    Types: Cigarettes  . Smokeless tobacco: Former NeurosurgeonUser  . Alcohol Use: No  . Drug Use: No  . Sexual Activity: Not on file   Other Topics Concern  . Not on file   Social History Narrative    Current Outpatient Prescriptions on File Prior to Visit  Medication Sig Dispense Refill  . losartan-hydrochlorothiazide (HYZAAR) 100-25 MG per tablet TAKE 1 TABLET BY MOUTH EVERY DAY WITH SUPPER 30 tablet 0  . lovastatin  (MEVACOR) 20 MG tablet Take 20 mg by mouth at bedtime.    . lovastatin (MEVACOR) 40 MG tablet TAKE 1 TABLET BY MOUTH AT BEDTIME 30 tablet 6  . traMADol (ULTRAM) 50 MG tablet TAKE 1 TABLET BY MOUTH EVERY 6 HOURS AS NEEDED 50 tablet 0   No current facility-administered medications on file prior to visit.    No Known Allergies  Family History  Problem Relation Age of Onset  . Cancer Neg Hx     BP 130/88 mmHg  Pulse 84  Temp(Src) 97.9 F (36.6 C) (Oral)  Ht 6\' 4"  (1.93 m)  Wt 359 lb 9.6 oz (163.113 kg)  BMI 43.79 kg/m2  SpO2 95%  Review of Systems  Constitutional:       He has weight gain  HENT: Negative for hearing loss.   Eyes: Negative for visual disturbance.  Respiratory: Negative for shortness of breath.   Cardiovascular: Negative for chest pain.  Gastrointestinal: Negative for anal bleeding.  Endocrine: Negative for cold intolerance.  Genitourinary: Negative for hematuria.  Musculoskeletal: Negative for back pain.  Skin: Negative for rash.  Allergic/Immunologic: Negative for environmental allergies.  Neurological: Negative for syncope and numbness.  Hematological: Does not bruise/bleed easily.  Psychiatric/Behavioral: Negative for dysphoric mood.  Objective:   Physical Exam VS: see vs page GEN: no distress.  Morbid obesity. HEAD: head: no deformity eyes: no periorbital swelling, no proptosis external nose and ears are normal mouth: no lesion seen NECK: supple, thyroid is not enlarged CHEST WALL: no deformity LUNGS: clear to auscultation BREASTS:  No gynecomastia CV: reg rate and rhythm, no murmur ABD: abdomen is soft, nontender.  no hepatosplenomegaly.  not distended.  no hernia. RECTAL: normal external and internal exam.  heme neg. PROSTATE:  Normal size.  No nodule MUSCULOSKELETAL: muscle bulk and strength are grossly normal.  no obvious joint swelling.  gait is normal and steady PULSES: no carotid bruit NEURO:  cn 2-12 grossly intact.   readily  moves all 4's. SKIN:  Normal texture and temperature.  No rash or suspicious lesion is visible.   NODES:  None palpable at the neck PSYCH: alert, well-oriented.  Does not appear anxious nor depressed.     Assessment & Plan:  Wellness visit today, with problems stable, except as noted.    SEPARATE EVALUATION FOLLOWS--EACH PROBLEM HERE IS NEW, NOT RESPONDING TO TREATMENT, OR POSES SIGNIFICANT RISK TO THE PATIENT'S HEALTH: HISTORY OF THE PRESENT ILLNESS: Pt states few months of intermittent moderate pain at the right foot, but no assoc numbness. PAST MEDICAL HISTORY reviewed and up to date today. REVIEW OF SYSTEMS: Anxiety has recurred (says precip by family issues).  buspar has helped in the past.  Denies fever PHYSICAL EXAMINATION: VITAL SIGNS:  See vs page GENERAL: no distress Pulses: dorsalis pedis intact bilat.   Feet: no deformity.  1+ bilat leg edema.  Heavy calluses on the feet. Skin:  no ulcer on the feet.  normal color and temp. Neuro: sensation is intact to touch on the feet LAB/XRAY RESULTS: Lab Results  Component Value Date   TSH 1.209 03/26/2014  IMPRESSION: Foot pain, new, possibly due to calluses Anxiety, recurrent PLAN:  Refer podiatry i have sent a prescription to your pharmacy, to resume the buspar.

## 2014-03-30 NOTE — Patient Instructions (Addendum)
please consider these measures for your health:  minimize alcohol.  do not use tobacco products.  have a colonoscopy at least every 10 years from age 58.  keep firearms safely stored.  always use seat belts.  have working smoke alarms in your home.  see an eye doctor and dentist regularly.  never drive under the influence of alcohol or drugs (including prescription drugs).  those with fair skin should take precautions against the sun.  Please see a foot specialist.  you will receive a phone call, about a day and time for an appointment. i have sent a prescription to your pharmacy, for the buspirone.   Please return in 1 year.

## 2014-03-30 NOTE — Progress Notes (Signed)
Pre visit review using our clinic review tool, if applicable. No additional management support is needed unless otherwise documented below in the visit note. 

## 2014-04-21 ENCOUNTER — Other Ambulatory Visit: Payer: Self-pay | Admitting: Endocrinology

## 2014-04-23 ENCOUNTER — Telehealth: Payer: Self-pay | Admitting: Endocrinology

## 2014-04-23 MED ORDER — LISINOPRIL-HYDROCHLOROTHIAZIDE 20-12.5 MG PO TABS: 2.0000 | ORAL_TABLET | Freq: Every day | ORAL | Status: DC

## 2014-04-23 MED ORDER — LOVASTATIN 40 MG PO TABS: 40.0000 mg | ORAL_TABLET | Freq: Every day | ORAL | Status: DC

## 2014-04-23 NOTE — Telephone Encounter (Signed)
i have sent a prescription to [facility] [address], for a similar med ([facility] too expensive).  This other med can cause a cough.  Please let me know if this happens.

## 2014-04-23 NOTE — Telephone Encounter (Signed)
Pt advised of below and voiced understanding. 

## 2014-04-23 NOTE — Telephone Encounter (Signed)
See below and please advise, Thanks!  

## 2014-04-23 NOTE — Telephone Encounter (Signed)
Patient states that he lost his health insurance and can not afford medication  2nd day off blood pressure medication  What affect does this have, he has been on this medication for 5 years and he does not feel comfortable been off     Please advise    Thank you

## 2014-04-30 ENCOUNTER — Encounter (HOSPITAL_COMMUNITY): Payer: Self-pay | Admitting: *Deleted

## 2014-04-30 ENCOUNTER — Emergency Department (HOSPITAL_COMMUNITY)
Admission: EM | Admit: 2014-04-30 | Discharge: 2014-04-30 | Disposition: A | Discharge status: Home / Self Care | Payer: PRIVATE HEALTH INSURANCE | Attending: Emergency Medicine | Admitting: Emergency Medicine

## 2014-04-30 ENCOUNTER — Emergency Department (HOSPITAL_COMMUNITY): Payer: PRIVATE HEALTH INSURANCE

## 2014-04-30 DIAGNOSIS — Z79899 Other long term (current) drug therapy: Secondary | ICD-10-CM | POA: Insufficient documentation

## 2014-04-30 DIAGNOSIS — R509 Fever, unspecified: Secondary | ICD-10-CM

## 2014-04-30 DIAGNOSIS — B349 Viral infection, unspecified: Secondary | ICD-10-CM

## 2014-04-30 DIAGNOSIS — Z720 Tobacco use: Secondary | ICD-10-CM | POA: Insufficient documentation

## 2014-04-30 LAB — COMPREHENSIVE METABOLIC PANEL
ALT: 17 U/L (ref 0–53)
ANION GAP: 13 (ref 5–15)
AST: 13 U/L (ref 0–37)
Albumin: 3.5 g/dL (ref 3.5–5.2)
Alkaline Phosphatase: 60 U/L (ref 39–117)
BUN: 11 mg/dL (ref 6–23)
CALCIUM: 8.9 mg/dL (ref 8.4–10.5)
CO2: 26 mEq/L (ref 19–32)
CREATININE: 0.81 mg/dL (ref 0.50–1.35)
Chloride: 97 mEq/L (ref 96–112)
Glucose, Bld: 120 mg/dL — ABNORMAL HIGH (ref 70–99)
Potassium: 3.5 mEq/L — ABNORMAL LOW (ref 3.7–5.3)
SODIUM: 136 meq/L — AB (ref 137–147)
TOTAL PROTEIN: 7 g/dL (ref 6.0–8.3)
Total Bilirubin: 0.2 mg/dL — ABNORMAL LOW (ref 0.3–1.2)

## 2014-04-30 LAB — CBC WITH DIFFERENTIAL/PLATELET
Basophils Absolute: 0 10*3/uL (ref 0.0–0.1)
Basophils Relative: 0 % (ref 0–1)
EOS ABS: 0.1 10*3/uL (ref 0.0–0.7)
Eosinophils Relative: 1 % (ref 0–5)
HEMATOCRIT: 44.7 % (ref 39.0–52.0)
HEMOGLOBIN: 15.4 g/dL (ref 13.0–17.0)
LYMPHS PCT: 19 % (ref 12–46)
Lymphs Abs: 1.7 10*3/uL (ref 0.7–4.0)
MCH: 30.5 pg (ref 26.0–34.0)
MCHC: 34.5 g/dL (ref 30.0–36.0)
MCV: 88.5 fL (ref 78.0–100.0)
MONO ABS: 0.4 10*3/uL (ref 0.1–1.0)
MONOS PCT: 5 % (ref 3–12)
Neutro Abs: 6.7 10*3/uL (ref 1.7–7.7)
Neutrophils Relative %: 75 % (ref 43–77)
PLATELETS: 180 10*3/uL (ref 150–400)
RBC: 5.05 MIL/uL (ref 4.22–5.81)
RDW: 12.7 % (ref 11.5–15.5)
WBC: 9 10*3/uL (ref 4.0–10.5)

## 2014-04-30 LAB — TROPONIN I: Troponin I: <0.3 ng/mL (ref <0.30)

## 2014-04-30 MED ORDER — KETOROLAC TROMETHAMINE 30 MG/ML IJ SOLN
30.0000 mg | Freq: Once | INTRAMUSCULAR | Status: AC
  Administered 2014-04-30: 30 mg via INTRAVENOUS
  Filled 2014-04-30: qty 1

## 2014-04-30 MED ORDER — SODIUM CHLORIDE 0.9 % IV BOLUS (SEPSIS)
1000.0000 mL | Freq: Once | INTRAVENOUS | Status: AC
  Administered 2014-04-30: 1000 mL via INTRAVENOUS

## 2014-04-30 MED ORDER — ONDANSETRON HCL 4 MG/2ML IJ SOLN
4.0000 mg | Freq: Once | INTRAMUSCULAR | Status: AC
  Administered 2014-04-30: 4 mg via INTRAVENOUS
  Filled 2014-04-30: qty 2

## 2014-04-30 MED ORDER — IPRATROPIUM-ALBUTEROL 0.5-2.5 (3) MG/3ML IN SOLN
3.0000 mL | Freq: Once | RESPIRATORY_TRACT | Status: AC
  Administered 2014-04-30: 3 mL via RESPIRATORY_TRACT
  Filled 2014-04-30: qty 3

## 2014-04-30 NOTE — ED Notes (Signed)
Pt c/o generalized body aches; pt states he has been running a fever and having chills; pt states he has been having the dry heaves

## 2014-04-30 NOTE — ED Provider Notes (Signed)
TIME SEEN: 8:15 PM  CHIEF COMPLAINT: Subjective fevers, chills, myalgias, chest pain shortness of breath, cough, runny nose, nausea  HPI: Patient is a 58 year old male with history of hypertension, hyperlipidemia, diabetes, obesity, tobacco use who presents to the emergency department with complaints of 3 days of subjective fevers, chills, sweats, myalgias, cough with green sputum production, rhinorrhea, chest pain with coughing and shortness of breath. He states in a he has had nausea and began feeling worse and he decided to come to the ER. No vomiting or diarrhea. No rash. No headache, neck pain or neck stiffness. States he has been taking care of his grandchildren recently had similar symptoms. States he did have a influenza vaccination on November 6. No recent travel.  ROS: See HPI Constitutional: Subjective fever  Eyes: no drainage  ENT: no runny nose   Cardiovascular:   chest pain  Resp:  SOB  GI: no vomiting GU: no dysuria Integumentary: no rash  Allergy: no hives  Musculoskeletal: no leg swelling  Neurological: no slurred speech ROS otherwise negative  PAST MEDICAL HISTORY/PAST SURGICAL HISTORY:  Past Medical History  Diagnosis Date  . ALLERGIC RHINITIS 03/01/2007  . ASTHMA 03/01/2007  . ERECTILE DYSFUNCTION 03/01/2007  . HYPERGLYCEMIA 03/01/2007  . HYPERLIPIDEMIA 03/01/2007  . HYPERTENSION 03/01/2007  . SHINGLES 03/01/2007  . Hx of echocardiogram     done by [place]   . Shortness of breath   . Sleep apnea     sees Dr. Shelle Ironlance - sleep study- 06/2011, uses CPAP q night   . Diabetes mellitus without complication     , borderline , per pt. , per Dr. Everardo AllEllison  . H/O hiatal hernia   . Arthritis     avascular necrosis - of L shoulder, knees- painful at time s    MEDICATIONS:  Prior to Admission medications   Medication Sig Start Date End Date Taking? Authorizing Provider  albuterol (VENTOLIN HFA) 108 (90 BASE) MCG/ACT inhaler Inhale 1-2 puffs into the lungs every 6 (six) hours  as needed for wheezing or shortness of breath. 03/30/14   Romero BellingSean Ellison, MD  busPIRone (BUSPAR) 30 MG tablet Take 1 tablet (30 mg total) by mouth 2 (two) times daily. 03/30/14   Romero BellingSean Ellison, MD  lisinopril-hydrochlorothiazide (PRINZIDE,ZESTORETIC) 20-12.5 MG per tablet Take 2 tablets by mouth daily. 04/23/14   Romero BellingSean Ellison, MD  lovastatin (MEVACOR) 20 MG tablet Take 20 mg by mouth at bedtime.    Historical Provider, MD  lovastatin (MEVACOR) 40 MG tablet Take 1 tablet (40 mg total) by mouth at bedtime. 04/23/14   Romero BellingSean Ellison, MD  traMADol (ULTRAM) 50 MG tablet TAKE 1 TABLET BY MOUTH EVERY 6 HOURS AS NEEDED 03/20/14   Romero BellingSean Ellison, MD    ALLERGIES:  No Known Allergies  SOCIAL HISTORY:  History  Substance Use Topics  . Smoking status: Current Every Day Smoker -- 1.00 packs/day for 31 years    Types: Cigarettes  . Smokeless tobacco: Former NeurosurgeonUser  . Alcohol Use: No    FAMILY HISTORY: Family History  Problem Relation Age of Onset  . Cancer Neg Hx     EXAM: BP 131/82 mmHg  Pulse 82  Temp(Src) 98.9 F (37.2 C) (Oral)  Resp 20  Ht 6\' 3"  (1.905 m)  Wt 355 lb (161.027 kg)  BMI 44.37 kg/m2  SpO2 97% CONSTITUTIONAL: Alert and oriented and responds appropriately to questions. Appears uncomfortable but in no distress, obese, nontoxic HEAD: Normocephalic EYES: Conjunctivae clear, PERRL ENT: normal nose; no rhinorrhea; moist mucous  membranes; pharynx without lesions noted NECK: Supple, no meningismus, no LAD  CARD: RRR; S1 and S2 appreciated; no murmurs, no clicks, no rubs, no gallops RESP: Normal chest excursion without splinting or tachypnea; breath sounds clear bilaterally but there are mild diffuse expiratory wheezes worse at the bases, no rhonchi or rales, no hypoxia or respiratory distress, speaking full sentences ABD/GI: Normal bowel sounds; non-distended; soft, non-tender, no rebound, no guarding BACK:  The back appears normal and is non-tender to palpation, there is no CVA  tenderness EXT: Normal ROM in all joints; non-tender to palpation; no edema; normal capillary refill; no cyanosis    SKIN: Normal color for age and race; warm, no rash or hives no petechia or purpura NEURO: Moves all extremities equally PSYCH: The patient's mood and manner are appropriate. Grooming and personal hygiene are appropriate.  MEDICAL DECISION MAKING: Patient here with likely viral illness, possible influenza. He does however have multiple risk factors for ACS and is complaining of chest pain and shortness of breath although atypical and likely related to his viral illness, coughing, wheezing. Will obtain troponin, chest x-ray, EKG. Will treat symptomatically with duonebs, Toradol, IV fluids, Zofran.  ED PROGRESS: Patient reports feeling better. Lungs are clear to auscultation with normal air movement, no hypoxia. Labs are unremarkable including negative troponin. Chest x-ray clear. I do not feel he needs serial sets of enzymes as I have low suspicion for ACS and I feel this is likely more related to his viral illness. Discussed with patient that I feel this is a viral illness. Have offered him Tamiflu which she declined. Have discussed with him alternating Tylenol and Motrin, drinking plenty of fluids, rest. We'll discharge with prescription for Zofran as needed for nausea and vomiting. Have discussed return precautions. He verbalizes understanding is comfortable with plan.      EKG Interpretation  Date/Time:  Monday April 30 2014 20:31:57 EST Ventricular Rate:  73 PR Interval:  168 QRS Duration: 88 QT Interval:  408 QTC Calculation: 449 R Axis:   38 Text Interpretation:  Normal sinus rhythm with sinus arrhythmia Normal ECG Confirmed by Kacy Hegna,  DO, Desirea Mizrahi ([id number]) on 04/30/2014 8:46:12 PM        Layla MawKristen N Alfrieda Tarry, DO 04/30/14 2116

## 2014-04-30 NOTE — Discharge Instructions (Signed)
You may take Tylenol 1000 mg every 6 hours as needed for pain and fever and alternate this with ibuprofen 800 mg every 8 hours as needed for pain and fever. Please take ibuprofen with food.   Viral Infections A viral infection can be caused by different types of viruses.Most viral infections are not serious and resolve on their own. However, some infections may cause severe symptoms and may lead to further complications. SYMPTOMS Viruses can frequently cause:  Minor sore throat.  Aches and pains.  Headaches.  Runny nose.  Different types of rashes.  Watery eyes.  Tiredness.  Cough.  Loss of appetite.  Gastrointestinal infections, resulting in nausea, vomiting, and diarrhea. These symptoms do not respond to antibiotics because the infection is not caused by bacteria. However, you might catch a bacterial infection following the viral infection. This is sometimes called a "superinfection." Symptoms of such a bacterial infection may include:  Worsening sore throat with pus and difficulty swallowing.  Swollen neck glands.  Chills and a high or persistent fever.  Severe headache.  Tenderness over the sinuses.  Persistent overall ill feeling (malaise), muscle aches, and tiredness (fatigue).  Persistent cough.  Yellow, green, or brown mucus production with coughing. HOME CARE INSTRUCTIONS   Only take over-the-counter or prescription medicines for pain, discomfort, diarrhea, or fever as directed by your caregiver.  Drink enough water and fluids to keep your urine clear or pale yellow. Sports drinks can provide valuable electrolytes, sugars, and hydration.  Get plenty of rest and maintain proper nutrition. Soups and broths with crackers or rice are fine. SEEK IMMEDIATE MEDICAL CARE IF:   You have severe headaches, shortness of breath, chest pain, neck pain, or an unusual rash.  You have uncontrolled vomiting, diarrhea, or you are unable to keep down fluids.  You or  your child has an oral temperature above 102 F (38.9 C), not controlled by medicine.  Your baby is older than 3 months with a rectal temperature of 102 F (38.9 C) or higher.  Your baby is 3 months old or younger with a rectal temperature19 of 100.4 F (38 C) or higher. MAKE SURE YOU:   Understand these instructions.  Will watch your condition.  Will get help right away if you are not doing well or get worse. Document Released: 02/18/2005 Document Revised: 08/03/2011 Document Reviewed: 09/15/2010 [facility] Patient Information 2015 [place], [place]. This information is not intended to replace advice given to you by your health care provider. Make sure you discuss any questions you have with your health care provider.   Possible Influenza Influenza ("the flu") is a viral infection of the respiratory tract. It occurs more often in winter months because people spend more time in close contact with one another. Influenza can make you feel very sick. Influenza easily spreads from person to person (contagious). CAUSES  Influenza is caused by a virus that infects the respiratory tract. You can catch the virus by breathing in droplets from an infected person's cough or sneeze. You can also catch the virus by touching something that was recently contaminated with the virus and then touching your mouth, nose, or eyes. RISKS AND COMPLICATIONS You may be at risk for a more severe case of influenza if you smoke cigarettes, have diabetes, have chronic heart disease (such as heart failure) or lung disease (such as asthma), or if you have a weakened immune system. Elderly people and pregnant women are also at risk for more serious infections. The most common problem of  influenza is a lung infection (pneumonia). Sometimes, this problem can require emergency medical care and may be life threatening. SIGNS AND SYMPTOMS  Symptoms typically last 4 to 10 days and may include:  Fever.  Chills.  Headache, body  aches, and muscle aches.  Sore throat.  Chest discomfort and cough.  Poor appetite.  Weakness or feeling tired.  Dizziness.  Nausea or vomiting. DIAGNOSIS  Diagnosis of influenza is often made based on your history and a physical exam. A nose or throat swab test can be done to confirm the diagnosis. TREATMENT  In mild cases, influenza goes away on its own. Treatment is directed at relieving symptoms. For more severe cases, your health care provider may prescribe antiviral medicines to shorten the sickness. Antibiotic medicines are not effective because the infection is caused by a virus, not by bacteria. HOME CARE INSTRUCTIONS  Take medicines only as directed by your health care provider.  Use a cool mist humidifier to make breathing easier.  Get plenty of rest until your temperature returns to normal. This usually takes 3 to 4 days.  Drink enough fluid to keep your urine clear or pale yellow.  Cover yourmouth and nosewhen coughing or sneezing,and wash your handswellto prevent thevirusfrom spreading.  Stay homefromwork orschool untilthe fever is gonefor at least 551full day. PREVENTION  An annual influenza vaccination (flu shot) is the best way to avoid getting influenza. An annual flu shot is now routinely recommended for all adults in the [place] SEEK MEDICAL CARE IF:  You experiencechest pain, yourcough worsens,or you producemore mucus.  Youhave nausea,vomiting, ordiarrhea.  Your fever returns or gets worse. SEEK IMMEDIATE MEDICAL CARE IF:  You havetrouble breathing, you become short of breath,or your skin ornails becomebluish.  You have severe painor stiffnessin the neck.  You develop a sudden headache, or pain in the face or ear.  You have nausea or vomiting that you cannot control. MAKE SURE YOU:   Understand these instructions.  Will watch your condition.  Will get help right away if you are not doing well or get worse. Document  Released: 05/08/2000 Document Revised: 09/25/2013 Document Reviewed: 08/10/2011 [facility] Patient Information 2015 [place], [place]. This information is not intended to replace advice given to you by your health care provider. Make sure you discuss any questions you have with your health care provider.

## 2014-05-17 ENCOUNTER — Other Ambulatory Visit: Payer: Self-pay | Admitting: Endocrinology

## 2014-05-28 ENCOUNTER — Other Ambulatory Visit: Payer: Self-pay

## 2014-05-28 MED ORDER — ALBUTEROL SULFATE HFA 108 (90 BASE) MCG/ACT IN AERS: 1.0000 | INHALATION_SPRAY | Freq: Four times a day (QID) | RESPIRATORY_TRACT | Status: DC | PRN

## 2014-05-29 ENCOUNTER — Telehealth: Payer: Self-pay

## 2014-05-29 MED ORDER — BUSPIRONE HCL 30 MG PO TABS: 30.0000 mg | ORAL_TABLET | Freq: Two times a day (BID) | ORAL | Status: DC

## 2014-05-29 MED ORDER — TRAMADOL HCL 50 MG PO TABS: 50.0000 mg | ORAL_TABLET | Freq: Four times a day (QID) | ORAL | Status: DC | PRN

## 2014-05-29 NOTE — Telephone Encounter (Signed)
Pt called requesting a refill on his Buspar and Tramadol.  Please advise if ok to refill.  Thanks!

## 2014-05-29 NOTE — Telephone Encounter (Signed)
done

## 2014-05-30 NOTE — Telephone Encounter (Signed)
Rx faxed

## 2014-06-01 ENCOUNTER — Telehealth: Payer: Self-pay | Admitting: Endocrinology

## 2014-06-01 NOTE — Telephone Encounter (Signed)
[facility]

## 2014-06-01 NOTE — Telephone Encounter (Signed)
Pt needs us to resend the Tramadol RX to [facility] - they do not have record of this prescription. F# [id number]

## 2014-06-21 ENCOUNTER — Emergency Department (HOSPITAL_COMMUNITY): Payer: PRIVATE HEALTH INSURANCE

## 2014-06-21 ENCOUNTER — Encounter (HOSPITAL_COMMUNITY): Payer: Self-pay | Admitting: *Deleted

## 2014-06-21 ENCOUNTER — Emergency Department (HOSPITAL_COMMUNITY)
Admission: EM | Admit: 2014-06-21 | Discharge: 2014-06-21 | Disposition: A | Discharge status: Home / Self Care | Payer: PRIVATE HEALTH INSURANCE | Attending: Emergency Medicine | Admitting: Emergency Medicine

## 2014-06-21 DIAGNOSIS — G4730 Sleep apnea, unspecified: Secondary | ICD-10-CM | POA: Insufficient documentation

## 2014-06-21 DIAGNOSIS — E119 Type 2 diabetes mellitus without complications: Secondary | ICD-10-CM | POA: Insufficient documentation

## 2014-06-21 DIAGNOSIS — Z720 Tobacco use: Secondary | ICD-10-CM | POA: Insufficient documentation

## 2014-06-21 DIAGNOSIS — E785 Hyperlipidemia, unspecified: Secondary | ICD-10-CM | POA: Insufficient documentation

## 2014-06-21 DIAGNOSIS — Z8669 Personal history of other diseases of the nervous system and sense organs: Secondary | ICD-10-CM | POA: Insufficient documentation

## 2014-06-21 DIAGNOSIS — Z87448 Personal history of other diseases of urinary system: Secondary | ICD-10-CM | POA: Insufficient documentation

## 2014-06-21 DIAGNOSIS — Z79899 Other long term (current) drug therapy: Secondary | ICD-10-CM | POA: Insufficient documentation

## 2014-06-21 DIAGNOSIS — I10 Essential (primary) hypertension: Secondary | ICD-10-CM | POA: Insufficient documentation

## 2014-06-21 DIAGNOSIS — Z8739 Personal history of other diseases of the musculoskeletal system and connective tissue: Secondary | ICD-10-CM | POA: Insufficient documentation

## 2014-06-21 DIAGNOSIS — Z9981 Dependence on supplemental oxygen: Secondary | ICD-10-CM | POA: Insufficient documentation

## 2014-06-21 DIAGNOSIS — J45901 Unspecified asthma with (acute) exacerbation: Secondary | ICD-10-CM | POA: Insufficient documentation

## 2014-06-21 DIAGNOSIS — J209 Acute bronchitis, unspecified: Secondary | ICD-10-CM

## 2014-06-21 MED ORDER — IPRATROPIUM BROMIDE 0.02 % IN SOLN
0.5000 mg | Freq: Once | RESPIRATORY_TRACT | Status: AC
  Administered 2014-06-21: 0.5 mg via RESPIRATORY_TRACT
  Filled 2014-06-21: qty 2.5

## 2014-06-21 MED ORDER — AZITHROMYCIN 250 MG PO TABS: 250.0000 mg | ORAL_TABLET | Freq: Every day | ORAL | Status: DC

## 2014-06-21 MED ORDER — PREDNISONE 20 MG PO TABS
20.0000 mg | ORAL_TABLET | Freq: Once | ORAL | Status: AC
  Administered 2014-06-21: 20 mg via ORAL
  Filled 2014-06-21: qty 1

## 2014-06-21 MED ORDER — ALBUTEROL SULFATE (2.5 MG/3ML) 0.083% IN NEBU
2.5000 mg | INHALATION_SOLUTION | Freq: Once | RESPIRATORY_TRACT | Status: AC
  Administered 2014-06-21: 2.5 mg via RESPIRATORY_TRACT
  Filled 2014-06-21: qty 3

## 2014-06-21 MED ORDER — ALBUTEROL SULFATE HFA 108 (90 BASE) MCG/ACT IN AERS
2.0000 | INHALATION_SPRAY | RESPIRATORY_TRACT | Status: DC | PRN
  Administered 2014-06-21: 2 via RESPIRATORY_TRACT
  Filled 2014-06-21: qty 6.7

## 2014-06-21 MED ORDER — AZITHROMYCIN 250 MG PO TABS
500.0000 mg | ORAL_TABLET | Freq: Once | ORAL | Status: AC
  Administered 2014-06-21: 500 mg via ORAL
  Filled 2014-06-21: qty 2

## 2014-06-21 MED ORDER — PREDNISONE 10 MG PO TABS: 20.0000 mg | ORAL_TABLET | Freq: Two times a day (BID) | ORAL | Status: DC

## 2014-06-21 NOTE — ED Notes (Signed)
MD at bedside. 

## 2014-06-21 NOTE — ED Notes (Signed)
Pt reporting cough and SOB for past few days.  States that he feels he has bronchitis.

## 2014-06-21 NOTE — ED Notes (Signed)
RT at bedside.

## 2014-06-21 NOTE — Discharge Instructions (Signed)
Zithromax and prednisone as prescribed.  Albuterol MDI: 2 puffs every 4 hours as needed for wheezing.  Return to the emergency department if you develop chest pain, difficulty breathing, or other new and concerning symptoms.   Acute Bronchitis Bronchitis is inflammation of the airways that extend from the windpipe into the lungs (bronchi). The inflammation often causes mucus to develop. This leads to a cough, which is the most common symptom of bronchitis.  In acute bronchitis, the condition usually develops suddenly and goes away over time, usually in a couple weeks. Smoking, allergies, and asthma can make bronchitis worse. Repeated episodes of bronchitis may cause further lung problems.  CAUSES Acute bronchitis is most often caused by the same virus that causes a cold. The virus can spread from person to person (contagious) through coughing, sneezing, and touching contaminated objects. SIGNS AND SYMPTOMS   Cough.   Fever.   Coughing up mucus.   Body aches.   Chest congestion.   Chills.   Shortness of breath.   Sore throat.  DIAGNOSIS  Acute bronchitis is usually diagnosed through a physical exam. Your health care provider will also ask you questions about your medical history. Tests, such as chest X-rays, are sometimes done to rule out other conditions.  TREATMENT  Acute bronchitis usually goes away in a couple weeks. Oftentimes, no medical treatment is necessary. Medicines are sometimes given for relief of fever or cough. Antibiotic medicines are usually not needed but may be prescribed in certain situations. In some cases, an inhaler may be recommended to help reduce shortness of breath and control the cough. A cool mist vaporizer may also be used to help thin bronchial secretions and make it easier to clear the chest.  HOME CARE INSTRUCTIONS  Get plenty of rest.   Drink enough fluids to keep your urine clear or pale yellow (unless you have a medical condition that  requires fluid restriction). Increasing fluids may help thin your respiratory secretions (sputum) and reduce chest congestion, and it will prevent dehydration.   Take medicines only as directed by your health care provider.  If you were prescribed an antibiotic medicine, finish it all even if you start to feel better.  Avoid smoking and secondhand smoke. Exposure to cigarette smoke or irritating chemicals will make bronchitis worse. If you are a smoker, consider using nicotine gum or skin patches to help control withdrawal symptoms. Quitting smoking will help your lungs heal faster.   Reduce the chances of another bout of acute bronchitis by washing your hands frequently, avoiding people with cold symptoms, and trying not to touch your hands to your mouth, nose, or eyes.   Keep all follow-up visits as directed by your health care provider.  SEEK MEDICAL CARE IF: Your symptoms do not improve after 1 week of treatment.  SEEK IMMEDIATE MEDICAL CARE IF:  You develop an increased fever or chills.   You have chest pain.   You have severe shortness of breath.  You have bloody sputum.   You develop dehydration.  You faint or repeatedly feel like you are going to pass out.  You develop repeated vomiting.  You develop a severe headache. MAKE SURE YOU:   Understand these instructions.  Will watch your condition.  Will get help right away if you are not doing well or get worse. Document Released: 06/18/2004 Document Revised: 09/25/2013 Document Reviewed: 11/01/2012 [facility]ExitCare Patient Information 2015 [place], [place]. This information is not intended to replace advice given to you by your health care  provider. Make sure you discuss any questions you have with your health care provider. ° °

## 2014-06-21 NOTE — ED Provider Notes (Signed)
CSN: [id number]     Arrival date & time 06/21/14  [id number] History   First MD Initiated Contact with Patient 06/21/14 [phone]     Chief Complaint  Patient presents with  . Shortness of Breath     (Consider location/radiation/quality/duration/timing/severity/associated sxs/prior Treatment) HPI Comments: Patient is a 59 year old male with history of diabetes, hypertension. He presents with a one-week history of chest congestion and productive cough. He also has asthma, however has been out of his inhaler. He denies any fevers or chills. He denies any chest pain.  Patient is a 59 y.o. male presenting with shortness of breath. The history is provided by the patient.  Shortness of Breath Severity:  Moderate Onset quality:  Gradual Duration:  1 week Timing:  Constant Progression:  Worsening Chronicity:  New Relieved by:  Nothing Worsened by:  Nothing tried Ineffective treatments:  None tried   Past Medical History  Diagnosis Date  . ALLERGIC RHINITIS 03/01/2007  . ASTHMA 03/01/2007  . ERECTILE DYSFUNCTION 03/01/2007  . HYPERGLYCEMIA 03/01/2007  . HYPERLIPIDEMIA 03/01/2007  . HYPERTENSION 03/01/2007  . SHINGLES 03/01/2007  . Hx of echocardiogram     done by [facility]   . Shortness of breath   . Sleep apnea     sees Dr. Shelle Ironlance - sleep study- 06/2011, uses CPAP q night   . Diabetes mellitus without complication     , borderline , per pt. , per Dr. Everardo AllEllison  . H/O hiatal hernia   . Arthritis     avascular necrosis - of L shoulder, knees- painful at time s   Past Surgical History  Procedure Laterality Date  . Stress cardiolite  11/01/2001  . Shoulder surgery  2005    Left  . Fx humerus      Left- 2005, required 2 surgeries   . Shoulder hemi-arthroplasty  04/11/2012    Procedure: SHOULDER HEMI-ARTHROPLASTY;  Surgeon: Mable ParisJustin William Chandler, MD;  [facility];  Service: Orthopedics;  Laterality: Left;  LEFT SHOULDER HEMIARTHROPLASTY    Family History  Problem Relation Age of Onset  .  Cancer Neg Hx    History  Substance Use Topics  . Smoking status: Current Every Day Smoker -- 1.00 packs/day for 31 years    Types: Cigarettes  . Smokeless tobacco: Former NeurosurgeonUser  . Alcohol Use: No    Review of Systems  Respiratory: Positive for shortness of breath.   All other systems reviewed and are negative.     Allergies  Review of patient's allergies indicates no known allergies.  Home Medications   Prior to Admission medications   Medication Sig Start Date End Date Taking? Authorizing Provider  albuterol (VENTOLIN HFA) 108 (90 BASE) MCG/ACT inhaler Inhale 1-2 puffs into the lungs every 6 (six) hours as needed for wheezing or shortness of breath. 05/28/14  Yes Romero BellingSean Ellison, MD  busPIRone (BUSPAR) 30 MG tablet Take 1 tablet (30 mg total) by mouth 2 (two) times daily. 05/29/14  Yes Romero BellingSean Ellison, MD  lisinopril-hydrochlorothiazide (PRINZIDE,ZESTORETIC) 20-12.5 MG per tablet Take 2 tablets by mouth daily. 04/23/14  Yes Romero BellingSean Ellison, MD  losartan-hydrochlorothiazide (HYZAAR) 100-25 MG per tablet TAKE 1 TABLET BY MOUTH EVERY DAY WITH SUPPER 05/17/14  Yes Romero BellingSean Ellison, MD  lovastatin (MEVACOR) 20 MG tablet Take 20 mg by mouth at bedtime.   Yes Historical Provider, MD  lovastatin (MEVACOR) 40 MG tablet Take 1 tablet (40 mg total) by mouth at bedtime. 04/23/14  Yes Romero BellingSean Ellison, MD  traMADol (ULTRAM) 50 MG tablet Take 1  tablet (50 mg total) by mouth every 6 (six) hours as needed. 05/29/14  Yes Romero Belling, MD   BP 118/62 mmHg  Pulse 77  Temp(Src) 97.8 F (36.6 C) (Oral)  Resp 16  SpO2 97% Physical Exam  Constitutional: He is oriented to person, place, and time. He appears well-developed and well-nourished. No distress.  HENT:  Head: Normocephalic and atraumatic.  Mouth/Throat: Oropharynx is clear and moist.  Neck: Normal range of motion. Neck supple.  Cardiovascular: Normal rate, regular rhythm and normal heart sounds.   No murmur heard. Pulmonary/Chest: Effort normal and breath  sounds normal. No respiratory distress. He has no wheezes.  Abdominal: Soft. Bowel sounds are normal. He exhibits no distension. There is no tenderness.  Musculoskeletal: Normal range of motion. He exhibits no edema.  Lymphadenopathy:    He has no cervical adenopathy.  Neurological: He is alert and oriented to person, place, and time.  Skin: Skin is warm and dry. He is not diaphoretic.  Nursing note and vitals reviewed.   ED Course  Procedures (including critical care time) Labs Review Labs Reviewed - No data to display  Imaging Review Dg Chest 2 View  06/21/2014   CLINICAL DATA:  Shortness of breath and cough this morning.  EXAM: CHEST  2 VIEW  COMPARISON:  04/30/2014  FINDINGS: Normal heart size and pulmonary vascularity. Emphysematous changes in the lungs with diffuse interstitial pattern consistent with chronic bronchitis. Blunting of the left costophrenic angle suggesting small fluid or thickened pleura. No pneumothorax. Mediastinal contours appear intact. No focal airspace disease or consolidation in the lungs. Similar appearance to previous study.  IMPRESSION: Emphysematous and chronic bronchitic changes in the lungs. No evidence of active pulmonary disease.   Electronically Signed   By: Burman Nieves M.D.   On: 06/21/2014 04:36     Date: 06/21/2014  Rate: 74  Rhythm: normal sinus rhythm  QRS Axis: normal  Intervals: normal  ST/T Wave abnormalities: normal  Conduction Disutrbances:none  Narrative Interpretation:   Old EKG Reviewed: none available    MDM   Final diagnoses:  None    Patient is a 59 year old male with no prior cardiac history. He presents today with chest congestion and productive cough for the past week. He feels as though he has bronchitis. He has been out of his inhaler she normally uses when he feels this way. Chest x-ray reveals bronchitic changes, however no heart failure or infiltrate. His EKG shows a sinus rhythm. I highly doubt a cardiac etiology  and do not feel as though further workup is indicated into this. He was given an albuterol treatment and is feeling better. At this point I feel as though he is appropriate for discharge. I will prescribe antibiotics, steroids, and an albuterol MDI. He is to return if his symptoms worsen or change. There is no hypoxia, tachypnea, or tachycardia.    Geoffery Lyons, MD 06/21/14 [phone]

## 2014-07-30 ENCOUNTER — Telehealth: Payer: Self-pay | Admitting: Endocrinology

## 2014-07-30 NOTE — Telephone Encounter (Signed)
Pt has CPAP that Dr.Clance rx he is in need of new supplies he asked if it was Dr. Everardo AllEllison or Dr. Shelle Ironlance to rx. I let him know it should be Dr. Everardo AllEllison.

## 2014-07-30 NOTE — Telephone Encounter (Signed)
Pls advise who should be sending in supplies.

## 2014-07-30 NOTE — Telephone Encounter (Signed)
Called and spoke with pt and pt states he already called Dr. Teddy Spikelance's office and was told he needed to come in for a visit or he could get the supplies directly from the place he got his cpap.  Pt states he has already pulled up [facility] and will get it from there.

## 2014-07-30 NOTE — Telephone Encounter (Signed)
Please ask dr Shelle Ironclance, as this is outside the scope of my practice

## 2014-07-30 NOTE — Telephone Encounter (Signed)
Left a message for return call.  

## 2014-08-20 ENCOUNTER — Other Ambulatory Visit: Payer: Self-pay | Admitting: Endocrinology

## 2014-09-19 ENCOUNTER — Encounter (HOSPITAL_COMMUNITY): Payer: Self-pay | Admitting: Emergency Medicine

## 2014-09-19 ENCOUNTER — Emergency Department (HOSPITAL_COMMUNITY)
Admission: EM | Admit: 2014-09-19 | Discharge: 2014-09-19 | Disposition: A | Discharge status: Home / Self Care | Payer: 59 | Attending: Emergency Medicine | Admitting: Emergency Medicine

## 2014-09-19 ENCOUNTER — Emergency Department (HOSPITAL_COMMUNITY): Payer: 59

## 2014-09-19 DIAGNOSIS — Z9981 Dependence on supplemental oxygen: Secondary | ICD-10-CM | POA: Insufficient documentation

## 2014-09-19 DIAGNOSIS — E785 Hyperlipidemia, unspecified: Secondary | ICD-10-CM | POA: Insufficient documentation

## 2014-09-19 DIAGNOSIS — R61 Generalized hyperhidrosis: Secondary | ICD-10-CM | POA: Insufficient documentation

## 2014-09-19 DIAGNOSIS — Z8739 Personal history of other diseases of the musculoskeletal system and connective tissue: Secondary | ICD-10-CM | POA: Insufficient documentation

## 2014-09-19 DIAGNOSIS — Z720 Tobacco use: Secondary | ICD-10-CM | POA: Insufficient documentation

## 2014-09-19 DIAGNOSIS — G4730 Sleep apnea, unspecified: Secondary | ICD-10-CM | POA: Diagnosis not present

## 2014-09-19 DIAGNOSIS — I7090 Unspecified atherosclerosis: Secondary | ICD-10-CM | POA: Insufficient documentation

## 2014-09-19 DIAGNOSIS — I6522 Occlusion and stenosis of left carotid artery: Secondary | ICD-10-CM

## 2014-09-19 DIAGNOSIS — R109 Unspecified abdominal pain: Secondary | ICD-10-CM | POA: Diagnosis not present

## 2014-09-19 DIAGNOSIS — I10 Essential (primary) hypertension: Secondary | ICD-10-CM | POA: Insufficient documentation

## 2014-09-19 DIAGNOSIS — M791 Myalgia: Secondary | ICD-10-CM | POA: Diagnosis not present

## 2014-09-19 DIAGNOSIS — J45901 Unspecified asthma with (acute) exacerbation: Secondary | ICD-10-CM | POA: Insufficient documentation

## 2014-09-19 DIAGNOSIS — Z8619 Personal history of other infectious and parasitic diseases: Secondary | ICD-10-CM | POA: Insufficient documentation

## 2014-09-19 DIAGNOSIS — R1110 Vomiting, unspecified: Secondary | ICD-10-CM | POA: Diagnosis not present

## 2014-09-19 DIAGNOSIS — R05 Cough: Secondary | ICD-10-CM | POA: Diagnosis present

## 2014-09-19 DIAGNOSIS — Z8719 Personal history of other diseases of the digestive system: Secondary | ICD-10-CM | POA: Insufficient documentation

## 2014-09-19 DIAGNOSIS — Z79899 Other long term (current) drug therapy: Secondary | ICD-10-CM | POA: Diagnosis not present

## 2014-09-19 DIAGNOSIS — J209 Acute bronchitis, unspecified: Secondary | ICD-10-CM

## 2014-09-19 DIAGNOSIS — Z87448 Personal history of other diseases of urinary system: Secondary | ICD-10-CM | POA: Diagnosis not present

## 2014-09-19 DIAGNOSIS — R197 Diarrhea, unspecified: Secondary | ICD-10-CM | POA: Insufficient documentation

## 2014-09-19 MED ORDER — PREDNISONE 50 MG PO TABS
60.0000 mg | ORAL_TABLET | Freq: Once | ORAL | Status: AC
  Administered 2014-09-19: 60 mg via ORAL
  Filled 2014-09-19 (×2): qty 1

## 2014-09-19 MED ORDER — PREDNISONE 50 MG PO TABS: ORAL_TABLET | ORAL | Status: DC

## 2014-09-19 MED ORDER — ALBUTEROL SULFATE (2.5 MG/3ML) 0.083% IN NEBU
2.5000 mg | INHALATION_SOLUTION | Freq: Once | RESPIRATORY_TRACT | Status: AC
  Administered 2014-09-19: 2.5 mg via RESPIRATORY_TRACT
  Filled 2014-09-19: qty 3

## 2014-09-19 MED ORDER — IPRATROPIUM-ALBUTEROL 0.5-2.5 (3) MG/3ML IN SOLN
3.0000 mL | Freq: Once | RESPIRATORY_TRACT | Status: AC
  Administered 2014-09-19: 3 mL via RESPIRATORY_TRACT
  Filled 2014-09-19: qty 3

## 2014-09-19 MED ORDER — ALBUTEROL SULFATE HFA 108 (90 BASE) MCG/ACT IN AERS: 1.0000 | INHALATION_SPRAY | Freq: Four times a day (QID) | RESPIRATORY_TRACT | Status: DC | PRN

## 2014-09-19 NOTE — ED Notes (Addendum)
Pt reports generalized body aches,nausea,diarrhea, productive cough since Monday. Mild dyspnea noted with exertion. Pt denies any known fevers.

## 2014-09-19 NOTE — Discharge Instructions (Signed)

## 2014-09-19 NOTE — ED Notes (Signed)
Pt states that he has been having a productive cough for the past few days.  Denies fever.  States has been aching all over at times.  Took some Corcidin HBP a few days ago with relief but it is not helping anymore.

## 2014-09-19 NOTE — ED Provider Notes (Signed)
CSN: [id number]     Arrival date & time 09/19/14  0810 History  This chart was scribed for Scott Rhine, MD by Elveria Rising, ED scribe.  This patient was seen in room APA18/APA18 and the patient's care was started at 8:27 AM.   Chief Complaint  Patient presents with  . Generalized Body Aches   Patient is a 59 y.o. male presenting with cough. The history is provided by the patient. No language interpreter was used.  Cough Cough characteristics:  Productive and dry Duration:  2 days Timing:  Intermittent Progression:  Worsening Chronicity:  New Smoker: yes   Context: smoke exposure and upper respiratory infection   Worsened by:  Activity Ineffective treatments:  Steroid inhaler Associated symptoms: chest pain, diaphoresis, myalgias, shortness of breath and wheezing   Associated symptoms: no fever   Risk factors: no recent travel    HPI Comments: [name] is a 59 y.o. male with PMHx of asthma, shortness of breath, and allergic rhinitis, who presents to the Emergency Department complaining of productive cough, congestion, myalgias, and nausea ongoing for two days after returning from fishing trip over the weekend. Patient with PMHx of chronic bronchitis. Patient is an admitted smoker: carton every week.  He reports CP with cough   Past Medical History  Diagnosis Date  . ALLERGIC RHINITIS 03/01/2007  . ASTHMA 03/01/2007  . ERECTILE DYSFUNCTION 03/01/2007  . HYPERGLYCEMIA 03/01/2007  . HYPERLIPIDEMIA 03/01/2007  . HYPERTENSION 03/01/2007  . SHINGLES 03/01/2007  . Hx of echocardiogram     done by [place]   . Shortness of breath   . Sleep apnea     sees Dr. Shelle Iron - sleep study- 06/2011, uses CPAP q night   . Diabetes mellitus without complication     , borderline , per pt. , per Dr. [name]  . H/O hiatal hernia   . Arthritis     avascular necrosis - of L shoulder, knees- painful at time s   Past Surgical History  Procedure Laterality Date  . Stress cardiolite  11/01/2001   . Shoulder surgery  2005    Left  . Fx humerus      Left- 2005, required 2 surgeries   . Shoulder hemi-arthroplasty  04/11/2012    Procedure: SHOULDER HEMI-ARTHROPLASTY;  Surgeon: [name], MD;  Location: [facility] OR;  Service: Orthopedics;  Laterality: Left;  LEFT SHOULDER HEMIARTHROPLASTY    Family History  Problem Relation Age of Onset  . Cancer Neg Hx    History  Substance Use Topics  . Smoking status: Current Every Day Smoker -- 1.00 packs/day for 31 years    Types: Cigarettes  . Smokeless tobacco: Former Neurosurgeon  . Alcohol Use: No    Review of Systems  Constitutional: Positive for diaphoresis. Negative for fever.  Respiratory: Positive for cough, shortness of breath and wheezing.   Cardiovascular: Positive for chest pain. Negative for leg swelling.  Gastrointestinal: Positive for vomiting, abdominal pain and diarrhea.  Musculoskeletal: Positive for myalgias.  All other systems reviewed and are negative.     Allergies  Review of patient's allergies indicates no known allergies.  Home Medications   Prior to Admission medications   Medication Sig Start Date End Date Taking? Authorizing Provider  albuterol (PROVENTIL HFA;VENTOLIN HFA) 108 (90 BASE) MCG/ACT inhaler Inhale 1-2 puffs into the lungs every 6 (six) hours as needed for wheezing or shortness of breath. 09/19/14   Scott Rhine, MD  busPIRone (BUSPAR) 30 MG tablet Take 1 tablet (30 mg  total) by mouth 2 (two) times daily. 05/29/14   Romero BellingSean Ellison, MD  lisinopril-hydrochlorothiazide (PRINZIDE,ZESTORETIC) 20-12.5 MG per tablet Take 2 tablets by mouth daily. 04/23/14   Romero BellingSean Ellison, MD  losartan-hydrochlorothiazide (HYZAAR) 100-25 MG per tablet TAKE 1 TABLET BY MOUTH EVERY DAY WITH SUPPER 05/17/14   Romero BellingSean Ellison, MD  lovastatin (MEVACOR) 20 MG tablet Take 20 mg by mouth at bedtime.    Historical Provider, MD  lovastatin (MEVACOR) 40 MG tablet Take 1 tablet (40 mg total) by mouth at bedtime. 04/23/14   Romero BellingSean  Ellison, MD  predniSONE (DELTASONE) 50 MG tablet One tablet PO daily for 4 days 09/19/14   Scott Rhineonald Jakota Manthei, MD  traMADol (ULTRAM) 50 MG tablet TAKE ONE TABLET BY MOUTH EVERY 6 HOURS AS NEEDED 08/27/14   Romero BellingSean Ellison, MD   Triage Vitals: BP 141/76 mmHg  Pulse 80  Temp(Src) 97.9 F (36.6 C) (Oral)  Resp 20  Ht 6\' 3"  (1.905 m)  Wt 340 lb (154.223 kg)  BMI 42.50 kg/m2  SpO2 95% Physical Exam  Nursing note and vitals reviewed. CONSTITUTIONAL: Well developed/well nourished HEAD: Normocephalic/atraumatic EYES: EOMI/PERRL ENMT: Mucous membranes moist NECK: supple no meningeal signs SPINE/BACK:entire spine nontender CV: S1/S2 noted, no murmurs/rubs/gallops noted LUNGS: wheezing noted in each base, no crackles, no distress ABDOMEN: soft, nontender, no rebound or guarding, bowel sounds noted throughout abdomen GU:no cva tenderness NEURO: Pt is awake/alert/appropriate, moves all extremitiesx4.  No facial droop.   EXTREMITIES: pulses normal/equal, full ROM SKIN: warm, color normal PSYCH: no abnormalities of mood noted, alert and oriented to situation   ED Course  Procedures  COORDINATION OF CARE: 8:31 AM- Plans to administer breathing treatment. Discussed treatment plan with patient at bedside and patient agreed to plan.    Pt improved after nebs He is well appearing Suspect bronchitic I doubt ACS/PE/CHF at this time Advised to quit smoking Advised of carotid artery calc. And need for f/u Pt agreeable  Medications  ipratropium-albuterol (DUONEB) 0.5-2.5 (3) MG/3ML nebulizer solution 3 mL (3 mLs Nebulization Given 09/19/14 0839)  albuterol (PROVENTIL) (2.5 MG/3ML) 0.083% nebulizer solution 2.5 mg (2.5 mg Nebulization Given 09/19/14 0839)  predniSONE (DELTASONE) tablet 60 mg (60 mg Oral Given 09/19/14 0842)    Imaging Review Dg Chest 2 View  09/19/2014   CLINICAL DATA:  Three-day history of chest pain with cough and shortness of breath  EXAM: CHEST  2 VIEW  COMPARISON:  June 21, 2014  FINDINGS: There is a degree of underlying emphysematous change. There is mild scarring in the lateral left base. There is no edema or consolidation. The heart size and pulmonary vascularity are within normal limits. No adenopathy. 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 There is calcification in the left carotid artery region.  IMPRESSION: Evidence of a degree of underlying emphysematous change. No edema or consolidation. Left carotid artery calcification.   Electronically Signed   By: Bretta BangWilliam  Woodruff III M.D.   On: 09/19/2014 09:34     EKG Interpretation   Date/Time:  Wednesday September 19 2014 08:36:36 EDT Ventricular Rate:  77 PR Interval:  153 QRS Duration: 91 QT Interval:  378 QTC Calculation: 428 R Axis:   57 Text Interpretation:  Sinus rhythm artifact noted No significant change  since last tracing Confirmed by Bebe ShaggyWICKLINE  MD, Dorinda HillNALD ([id number]) on 09/19/2014  8:42:36 AM      MDM   Final diagnoses:  Acute bronchitis, unspecified organism  Carotid artery calcification, left    Nursing notes including past medical  history and social history reviewed and considered in documentation xrays/imaging reviewed by myself and considered during evaluation   I personally performed the services described in this documentation, which was scribed in my presence. The recorded information has been reviewed and is accurate.      Scott Rhine, MD 09/19/14 [phone]

## 2014-09-20 ENCOUNTER — Telehealth: Payer: Self-pay | Admitting: Endocrinology

## 2014-09-20 NOTE — Telephone Encounter (Signed)
Pt calling regarding [facility] visit to [facility], he did not see any pneumonia but but him on prednisone. Can we look at his results from the visit and be sure he doesn't have pneumonia. He has been on the prednisone for a few days and has had no change in symptoms

## 2014-09-21 ENCOUNTER — Encounter (HOSPITAL_COMMUNITY): Payer: Self-pay | Admitting: *Deleted

## 2014-09-21 ENCOUNTER — Emergency Department (HOSPITAL_COMMUNITY)
Admission: EM | Admit: 2014-09-21 | Discharge: 2014-09-21 | Disposition: A | Discharge status: Home / Self Care | Payer: 59 | Attending: Emergency Medicine | Admitting: Emergency Medicine

## 2014-09-21 ENCOUNTER — Other Ambulatory Visit: Payer: Self-pay | Admitting: Endocrinology

## 2014-09-21 DIAGNOSIS — J069 Acute upper respiratory infection, unspecified: Secondary | ICD-10-CM | POA: Diagnosis not present

## 2014-09-21 DIAGNOSIS — G4733 Obstructive sleep apnea (adult) (pediatric): Secondary | ICD-10-CM | POA: Diagnosis not present

## 2014-09-21 DIAGNOSIS — Z8719 Personal history of other diseases of the digestive system: Secondary | ICD-10-CM | POA: Diagnosis not present

## 2014-09-21 DIAGNOSIS — Z9981 Dependence on supplemental oxygen: Secondary | ICD-10-CM | POA: Diagnosis not present

## 2014-09-21 DIAGNOSIS — E785 Hyperlipidemia, unspecified: Secondary | ICD-10-CM | POA: Diagnosis not present

## 2014-09-21 DIAGNOSIS — Z87448 Personal history of other diseases of urinary system: Secondary | ICD-10-CM | POA: Diagnosis not present

## 2014-09-21 DIAGNOSIS — M1990 Unspecified osteoarthritis, unspecified site: Secondary | ICD-10-CM | POA: Insufficient documentation

## 2014-09-21 DIAGNOSIS — J45909 Unspecified asthma, uncomplicated: Secondary | ICD-10-CM | POA: Diagnosis not present

## 2014-09-21 DIAGNOSIS — Z8619 Personal history of other infectious and parasitic diseases: Secondary | ICD-10-CM | POA: Diagnosis not present

## 2014-09-21 DIAGNOSIS — E119 Type 2 diabetes mellitus without complications: Secondary | ICD-10-CM | POA: Diagnosis not present

## 2014-09-21 DIAGNOSIS — R0981 Nasal congestion: Secondary | ICD-10-CM | POA: Diagnosis present

## 2014-09-21 MED ORDER — TRAMADOL HCL 50 MG PO TABS: 50.0000 mg | ORAL_TABLET | Freq: Four times a day (QID) | ORAL | Status: DC | PRN

## 2014-09-21 MED ORDER — PREDNISONE 10 MG PO TABS: ORAL_TABLET | ORAL | Status: DC

## 2014-09-21 MED ORDER — FLUTICASONE PROPIONATE 50 MCG/ACT NA SUSP: 1.0000 | Freq: Every day | NASAL | Status: DC

## 2014-09-21 MED ORDER — LOVASTATIN 40 MG PO TABS: 40.0000 mg | ORAL_TABLET | Freq: Every day | ORAL | Status: DC

## 2014-09-21 NOTE — Addendum Note (Signed)
Addended by: Romero BellingELLISON, Afton Mikelson on: 09/21/2014 09:31 AM   Modules accepted: Orders, Medications

## 2014-09-21 NOTE — Addendum Note (Signed)
Addended by: Bethann PunchesUCK, MEGAN E on: 09/21/2014 10:18 AM   Modules accepted: Orders

## 2014-09-21 NOTE — Telephone Encounter (Signed)
Rx sent to pharmacy   

## 2014-09-21 NOTE — Telephone Encounter (Signed)
I contacted patient and advised of note below. Pt declined office visit, pt stated he was currently at the hospital.

## 2014-09-21 NOTE — Telephone Encounter (Signed)
No pneumonia was seen.  i would be happy to add on at 3 PM today if necessary

## 2014-09-21 NOTE — Discharge Instructions (Signed)
Upper Respiratory Infection, Adult An upper respiratory infection (URI) is also sometimes known as the common cold. The upper respiratory tract includes the nose, sinuses, throat, trachea, and bronchi. Bronchi are the airways leading to the lungs. Most people improve within 1 week, but symptoms can last up to 2 weeks. A residual cough may last even longer.  CAUSES Many different viruses can infect the tissues lining the upper respiratory tract. The tissues become irritated and inflamed and often become very moist. Mucus production is also common. A cold is contagious. You can easily spread the virus to others by oral contact. This includes kissing, sharing a glass, coughing, or sneezing. Touching your mouth or nose and then touching a surface, which is then touched by another person, can also spread the virus. SYMPTOMS  Symptoms typically develop 1 to 3 days after you come in contact with a cold virus. Symptoms vary from person to person. They may include:  Runny nose.  Sneezing.  Nasal congestion.  Sinus irritation.  Sore throat.  Loss of voice (laryngitis).  Cough.  Fatigue.  Muscle aches.  Loss of appetite.  Headache.  Low-grade fever. DIAGNOSIS  You might diagnose your own cold based on familiar symptoms, since most people get a cold 2 to 3 times a year. Your caregiver can confirm this based on your exam. Most importantly, your caregiver can check that your symptoms are not due to another disease such as strep throat, sinusitis, pneumonia, asthma, or epiglottitis. Blood tests, throat tests, and X-rays are not necessary to diagnose a common cold, but they may sometimes be helpful in excluding other more serious diseases. Your caregiver will decide if any further tests are required. RISKS AND COMPLICATIONS  You may be at risk for a more severe case of the common cold if you smoke cigarettes, have chronic heart disease (such as heart failure) or lung disease (such as asthma), or if  you have a weakened immune system. The very young and very old are also at risk for more serious infections. Bacterial sinusitis, middle ear infections, and bacterial pneumonia can complicate the common cold. The common cold can worsen asthma and chronic obstructive pulmonary disease (COPD). Sometimes, these complications can require emergency medical care and may be life-threatening. PREVENTION  The best way to protect against getting a cold is to practice good hygiene. Avoid oral or hand contact with people with cold symptoms. Wash your hands often if contact occurs. There is no clear evidence that vitamin C, vitamin E, echinacea, or exercise reduces the chance of developing a cold. However, it is always recommended to get plenty of rest and practice good nutrition. TREATMENT  Treatment is directed at relieving symptoms. There is no cure. Antibiotics are not effective, because the infection is caused by a virus, not by bacteria. Treatment may include:  Increased fluid intake. Sports drinks offer valuable electrolytes, sugars, and fluids.  Breathing heated mist or steam (vaporizer or shower).  Eating chicken soup or other clear broths, and maintaining good nutrition.  Getting plenty of rest.  Using gargles or lozenges for comfort.  Controlling fevers with ibuprofen or acetaminophen as directed by your caregiver.  Increasing usage of your inhaler if you have asthma. Zinc gel and zinc lozenges, taken in the first 24 hours of the common cold, can shorten the duration and lessen the severity of symptoms. Pain medicines may help with fever, muscle aches, and throat pain. A variety of non-prescription medicines are available to treat congestion and runny nose. Your caregiver   can make recommendations and may suggest nasal or lung inhalers for other symptoms.  HOME CARE INSTRUCTIONS   Only take over-the-counter or prescription medicines for pain, discomfort, or fever as directed by your  caregiver.  Use a warm mist humidifier or inhale steam from a shower to increase air moisture. This may keep secretions moist and make it easier to breathe.  Drink enough water and fluids to keep your urine clear or pale yellow.  Rest as needed.  Return to work when your temperature has returned to normal or as your caregiver advises. You may need to stay home longer to avoid infecting others. You can also use a face mask and careful hand washing to prevent spread of the virus. SEEK MEDICAL CARE IF:   After the first few days, you feel you are getting worse rather than better.  You need your caregiver's advice about medicines to control symptoms.  You develop chills, worsening shortness of breath, or brown or red sputum. These may be signs of pneumonia.  You develop yellow or brown nasal discharge or pain in the face, especially when you bend forward. These may be signs of sinusitis.  You develop a fever, swollen neck glands, pain with swallowing, or white areas in the back of your throat. These may be signs of strep throat. SEEK IMMEDIATE MEDICAL CARE IF:   You have a fever.  You develop severe or persistent headache, ear pain, sinus pain, or chest pain.  You develop wheezing, a prolonged cough, cough up blood, or have a change in your usual mucus (if you have chronic lung disease).  You develop sore muscles or a stiff neck. Document Released: 11/04/2000 Document Revised: 08/03/2011 Document Reviewed: 08/16/2013 ExitCare Patient Information 2015 [employer]. This information is not intended to replace advice given to you by your health care provider. Make sure you discuss any questions you have with your health care provider.  

## 2014-09-21 NOTE — ED Notes (Signed)
Pt seen in ED Wednesday for chest congestion, pt states he feels more congestion in sinuses.

## 2014-09-21 NOTE — Telephone Encounter (Signed)
Pt is requesting a refill on his tramadol. Patient stated he does not have enough to last him until Monday. Last refill was 08/27/2014.

## 2014-09-21 NOTE — Telephone Encounter (Signed)
See note below and please advise, Thanks! 

## 2014-09-21 NOTE — Addendum Note (Signed)
Addended by: Romero BellingELLISON, Delfin Squillace on: 09/21/2014 11:41 AM   Modules accepted: Orders

## 2014-09-21 NOTE — ED Provider Notes (Signed)
CSN: [id number]     Arrival date & time 09/21/14  [id number] History  This chart was scribed for Scott Creasehristopher J Trinadee Verhagen, MD by Murriel HopperAlec Bankhead, ED Scribe. This patient was seen in room APA19/APA19 and the patient's care was started at 9:42 AM.    Chief Complaint  Patient presents with  . Nasal Congestion     The history is provided by the patient. No language interpreter was used.     HPI Comments: [name] is a 59 y.o. male who presents to the Emergency Department complaining of constant worsening chest congestion with associated sinus congestion that has been present for a few days. Pt was seen two days ago in ED for same complaints. Pt notes that he has been coughing up green and grey phlegm. Pt states she has taken Ibuprofen regularly for symptoms with little relief. Pt notes he was instructed by PCP to come to ED today.     Past Medical History  Diagnosis Date  . ALLERGIC RHINITIS 03/01/2007  . ASTHMA 03/01/2007  . ERECTILE DYSFUNCTION 03/01/2007  . HYPERGLYCEMIA 03/01/2007  . HYPERLIPIDEMIA 03/01/2007  . HYPERTENSION 03/01/2007  . SHINGLES 03/01/2007  . Hx of echocardiogram     done by [facility]   . Shortness of breath   . Sleep apnea     sees Dr. Shelle Ironlance - sleep study- 06/2011, uses CPAP q night   . Diabetes mellitus without complication     , borderline , per pt. , per Dr. Everardo AllEllison  . H/O hiatal hernia   . Arthritis     avascular necrosis - of L shoulder, knees- painful at time s   Past Surgical History  Procedure Laterality Date  . Stress cardiolite  11/01/2001  . Shoulder surgery  2005    Left  . Fx humerus      Left- 2005, required 2 surgeries   . Shoulder hemi-arthroplasty  04/11/2012    Procedure: SHOULDER HEMI-ARTHROPLASTY;  Surgeon: Mable ParisJustin William Chandler, MD;  [facility];  Service: Orthopedics;  Laterality: Left;  LEFT SHOULDER HEMIARTHROPLASTY    Family History  Problem Relation Age of Onset  . Cancer Neg Hx    History  Substance Use Topics  . Smoking  status: Current Every Day Smoker -- 1.00 packs/day for 31 years    Types: Cigarettes  . Smokeless tobacco: Former NeurosurgeonUser  . Alcohol Use: No    Review of Systems  HENT: Positive for congestion.   All other systems reviewed and are negative.     Allergies  Review of patient's allergies indicates no known allergies.  Home Medications   Prior to Admission medications   Medication Sig Start Date End Date Taking? Authorizing Provider  albuterol (PROVENTIL HFA;VENTOLIN HFA) 108 (90 BASE) MCG/ACT inhaler Inhale 1-2 puffs into the lungs every 6 (six) hours as needed for wheezing or shortness of breath. 09/19/14  Yes Zadie Rhineonald Wickline, MD  ibuprofen (ADVIL,MOTRIN) 200 MG tablet Take 800 mg by mouth every 6 (six) hours as needed for headache.   Yes Historical Provider, MD  losartan-hydrochlorothiazide (HYZAAR) 100-25 MG per tablet TAKE 1 TABLET BY MOUTH EVERY DAY WITH SUPPER 05/17/14  Yes Romero BellingSean Ellison, MD  busPIRone (BUSPAR) 30 MG tablet Take 1 tablet (30 mg total) by mouth 2 (two) times daily. Patient not taking: Reported on 09/21/2014 05/29/14   Romero BellingSean Ellison, MD  fluticasone [facility](FLONASE) 50 MCG/ACT nasal spray Place 1 spray into both nostrils daily. 09/21/14   Scott Creasehristopher J Shaketta Rill, MD  lovastatin (MEVACOR) 40 MG tablet  Take 1 tablet (40 mg total) by mouth at bedtime. 09/21/14   Romero Belling, MD  predniSONE (DELTASONE) 10 MG tablet Take 4 tablets for 1 day, then 3 tablets for 1 day, then 2 tablets for 1 day, then 1 tablet for 1 day, then stop. Start this tapering dose on the day after your current course of prednisone finishes. 09/21/14   Scott Crease, MD  predniSONE (DELTASONE) 50 MG tablet One tablet PO daily for 4 days Patient not taking: Reported on 09/21/2014 09/19/14   Zadie Rhine, MD  traMADol (ULTRAM) 50 MG tablet Take 1 tablet (50 mg total) by mouth every 6 (six) hours as needed. for pain 09/21/14   Romero Belling, MD   BP 136/110 mmHg  Pulse 93  Temp(Src) 97.5 F (36.4 C) (Oral)  Resp  20  Ht  (1.905 m)  Wt 340 lb (154.223 kg)  BMI 42.50 kg/m2  SpO2 98% Physical Exam  Constitutional: He is oriented to person, place, and time. He appears well-developed and well-nourished. No distress.  HENT:  Head: Normocephalic and atraumatic.  Right Ear: Hearing normal.  Left Ear: Hearing normal.  Nose: Nose normal.  Mouth/Throat: Oropharynx is clear and moist and mucous membranes are normal.  Mucosal edema and congestion  Eyes: Conjunctivae and EOM are normal. Pupils are equal, round, and reactive to light.  Neck: Normal range of motion. Neck supple.  Cardiovascular: Regular rhythm, S1 normal and S2 normal.  Exam reveals no gallop and no friction rub.   No murmur heard. Pulmonary/Chest: Effort normal and breath sounds normal. No respiratory distress. He exhibits no tenderness.  Abdominal: Soft. Normal appearance and bowel sounds are normal. There is no hepatosplenomegaly. There is no tenderness. There is no rebound, no guarding, no tenderness at McBurney's point and negative Murphy's sign. No hernia.  Musculoskeletal: Normal range of motion.  Neurological: He is alert and oriented to person, place, and time. He has normal strength. No cranial nerve deficit or sensory deficit. Coordination normal. GCS eye subscore is 4. GCS verbal subscore is 5. GCS motor subscore is 6.  Skin: Skin is warm, dry and intact. No rash noted. No cyanosis.  Psychiatric: He has a normal mood and affect. His speech is normal and behavior is normal. Thought content normal.  Nursing note and vitals reviewed.   ED Course  Procedures (including critical care time)  DIAGNOSTIC STUDIES: Oxygen Saturation is 98% on room air, normal by my interpretation.    COORDINATION OF CARE: 9:45 AM Discussed treatment plan with pt at bedside and pt agreed to plan.   Labs Review Labs Reviewed - No data to display  Imaging Review No results found.   EKG Interpretation None      MDM   Final diagnoses:   URI (upper respiratory infection)    Patient presents to the ER for evaluation of nasal congestion. Patient was seen in the ER several days ago for chest congestion and cough. He was placed on prednisone. He reports that his cough has improved, but now he is miserable with congestion in his sinuses. He called his primary care doctor, Dr. [name], this morning. He tells me that he was told that if his sinus drainage was cranial likely was infection and he needed to go back to the emergency department because they were full today.  Patient was informed that the color of his discharge does not dictate bacterial infection. Additionally, patient was informed that treatment for sinusitis is not recommended until at least 2  weeks of symptoms. Patient prescribed Flonase and the short course of steroids that had been previously prescribed was extended in a tapering fashion.  I personally performed the services described in this documentation, which was scribed in my presence. The recorded information has been reviewed and is accurate.     Scott Crease, MD 09/22/14 [phone]

## 2014-09-21 NOTE — Telephone Encounter (Signed)
i printed 

## 2014-09-26 ENCOUNTER — Telehealth: Payer: Self-pay | Admitting: Pulmonary Disease

## 2014-09-26 DIAGNOSIS — G4733 Obstructive sleep apnea (adult) (pediatric): Secondary | ICD-10-CM

## 2014-09-26 NOTE — Telephone Encounter (Signed)
Spoke with pt, states [facility].  [facility] [place] [facility] [place]    [place] are you ok with this DME switch?  Thanks.

## 2014-10-01 ENCOUNTER — Telehealth: Payer: Self-pay | Admitting: Pulmonary Disease

## 2014-10-01 NOTE — Telephone Encounter (Signed)
Order placed to switch DME  Pt aware order placed.  Nothing further needed.

## 2014-10-01 NOTE — Telephone Encounter (Signed)
Whatever the pt wishes to do.

## 2014-10-01 NOTE — Telephone Encounter (Signed)
lmtcb X1 for Melissa.  

## 2014-10-02 NOTE — Telephone Encounter (Signed)
lmtcb for Melissa.      Velvet BatheAshley L Caulfield, CMA at 09/26/2014 11:11 AM     Status: Signed       Expand All Collapse All   Spoke with pt, states [facility]. [facility] [place].

## 2014-10-03 NOTE — Telephone Encounter (Signed)
Spoke with Melissa. We have not seen the patient in over 2 years.  Spoke with the pt. He has been scheduled to see [facility] on 10/08/14 at 11am [facility]. Advised him that we will order his supplies then. Nothing further was needed.

## 2014-10-08 ENCOUNTER — Ambulatory Visit (INDEPENDENT_AMBULATORY_CARE_PROVIDER_SITE_OTHER): Payer: 59 | Admitting: Pulmonary Disease

## 2014-10-08 ENCOUNTER — Encounter: Payer: Self-pay | Admitting: Pulmonary Disease

## 2014-10-08 VITALS — BP 138/82 | HR 103 | Ht 75.0 in | Wt 351.0 lb

## 2014-10-08 DIAGNOSIS — G4733 Obstructive sleep apnea (adult) (pediatric): Secondary | ICD-10-CM

## 2014-10-08 DIAGNOSIS — Z9989 Dependence on other enabling machines and devices: Principal | ICD-10-CM

## 2014-10-08 NOTE — Progress Notes (Signed)
Chief Complaint  Patient presents with  . Follow-up    [facility] for [facility]- stopped wearing CPAP x 6-7 months ago. Pt states that he was unable to get routine supplies. Needs new supplies to restart CPAP. Wants to use [facility] in [place].     History of Present Illness: Scott MomentRandolph P Patel is a 59 y.o. male for evaluation of sleep problems.  He has a history of severe OSA and was previously seen by Dr. Shelle Ironlance.  He was on auto CPAP up to 20 cm H2O.    He had trouble getting supplies from his DME >> [facility].  As a result he has not been able to use CPAP consistently for the past few months.  His wife has been concerned about his snoring, and that he stops breathing.  He goes to sleep at 11 pm.  He falls asleep quickly.  He wakes up sometimes to use the bathroom.  He gets out of bed at 6 am.  He feels tired in the morning.  He sometimes gets morning headache.  He does not use anything to help him fall sleep.  He drinks two cups of coffee in the morning.  He denies sleep walking, sleep talking, bruxism, or nightmares.  There is no history of restless legs.  He denies sleep hallucinations, sleep paralysis, or cataplexy.  The Epworth score is 9 out of 24.  Tests: PSG 06/24/11 >> AHI 81, SaO2 low 76%  [name]  has a past medical history of ALLERGIC RHINITIS (03/01/2007); ASTHMA (03/01/2007); ERECTILE DYSFUNCTION (03/01/2007); HYPERGLYCEMIA (03/01/2007); HYPERLIPIDEMIA (03/01/2007); HYPERTENSION (03/01/2007); SHINGLES (03/01/2007); echocardiogram; Shortness of breath; Sleep apnea; Diabetes mellitus without complication; H/O hiatal hernia; and Arthritis.  [name]  has past surgical history that includes Stress Cardiolite (11/01/2001); Shoulder surgery (2005); Fx Humerus; and Shoulder hemi-arthroplasty (04/11/2012).  Prior to Admission medications   Medication Sig Start Date End Date Taking? Authorizing Provider  albuterol (PROVENTIL HFA;VENTOLIN HFA) 108 (90 BASE) MCG/ACT inhaler Inhale  1-2 puffs into the lungs every 6 (six) hours as needed for wheezing or shortness of breath. 09/19/14  Yes Zadie Rhineonald Wickline, MD  busPIRone (BUSPAR) 30 MG tablet Take 1 tablet (30 mg total) by mouth 2 (two) times daily. 05/29/14  Yes Romero BellingSean Ellison, MD  losartan-hydrochlorothiazide (HYZAAR) 100-25 MG per tablet TAKE 1 TABLET BY MOUTH EVERY DAY WITH SUPPER 05/17/14  Yes Romero BellingSean Ellison, MD  lovastatin (MEVACOR) 40 MG tablet Take 1 tablet (40 mg total) by mouth at bedtime. 09/21/14  Yes Romero BellingSean Ellison, MD  traMADol (ULTRAM) 50 MG tablet Take 1 tablet (50 mg total) by mouth every 6 (six) hours as needed. for pain 09/21/14  Yes Romero BellingSean Ellison, MD    No Known Allergies  His family history is negative for Cancer.  He  reports that he has been smoking Cigarettes.  He has a 31 pack-year smoking history. He has quit using smokeless tobacco. He reports that he does not drink alcohol or use illicit drugs.   Physical Exam: Blood pressure 138/82, pulse 103, height 6\' 3"  (1.905 m), weight 351 lb (159.213 kg), SpO2 98 %. Body mass index is 43.87 kg/(m^2).  General - No distress ENT - No sinus tenderness, no oral exudate, no LAN, no thyromegaly, TM clear, pupils equal/reactive, MP 4 Cardiac - s1s2 regular, no murmur, pulses symmetric Chest - No wheeze/rales/dullness, good air entry, normal respiratory excursion Back - No focal tenderness Abd - Soft, non-tender, no organomegaly, + bowel sounds Ext - No edema  Neuro - Normal strength, cranial nerves intact Skin - No rashes Psych - Normal mood, and behavior  Assessment/plan:  Obstructive sleep apnea. Plan: - will resume auto CPAP range 5 to 20 cm H2O - will get download from his machine - [facility] [place] >> closer to home  Asthma. Plan: - advised he could have further assessment with pulmonary if needed  Obesity. Plan: - discussed importance of weight loss   Coralyn HellingVineet Marlo Goodrich, M.D. Pager [phone]

## 2014-10-08 NOTE — Patient Instructions (Signed)
Will have your home care company changed to [facility] in [place] Will call with results of CPAP download Follow up in 1 year

## 2014-10-11 ENCOUNTER — Telehealth: Payer: Self-pay | Admitting: Pulmonary Disease

## 2014-10-11 DIAGNOSIS — G4733 Obstructive sleep apnea (adult) (pediatric): Secondary | ICD-10-CM

## 2014-10-11 NOTE — Telephone Encounter (Signed)
Per [facility] : place order for new CPAP humidifier and to check functionality of CPAP machine.  [facility]

## 2014-10-11 NOTE — Telephone Encounter (Signed)
Letter given to Dr Craige CottaSood to address order.

## 2014-10-11 NOTE — Telephone Encounter (Signed)
Called pt and aware that order to be placed to check machine for functionality. Pt states that [facility] and they deemed it unsafe to use d/t malfunction.  Order placed for new machine, same settings.  Nothing further needed.

## 2014-10-12 ENCOUNTER — Telehealth: Payer: Self-pay | Admitting: Pulmonary Disease

## 2014-10-12 DIAGNOSIS — G4733 Obstructive sleep apnea (adult) (pediatric): Secondary | ICD-10-CM

## 2014-10-12 NOTE — Telephone Encounter (Signed)
That will be a good solution since pt does not want to use [facility] anymore is the machine he has unusable?

## 2014-10-12 NOTE — Telephone Encounter (Signed)
Spoke with Almyra FreeLibby, the best option for the patient is going to be for him to have a HST. Please advise Dr Craige CottaSood if you are okay with ordering this. Thanks.

## 2014-10-12 NOTE — Telephone Encounter (Signed)
[facility] because he is in the process of trying to switch to new DME, machine was deemed unsafe (humidifier has melted d/t machine malfunctioning). States that even though machine has been deemed unsafe to use and to be malfunctioning, machine will need to be taken into [facility] (as it was provided by [facility] initially) and sent off to manufacturer. [facility] is not able to do repairs or replace a machine they did not supply UNLESS the patient's current machine is deemed non-repairable.  [facility] is not able to help the patient at this time - pt will need to contact [facility] to have machine sent off to be repaired, loaner should be given during this process.  Per Melissa, once machine is deemed "non-repairable" they can then take the patient on and start the process of getting him a new machine.  ----- Spoke with the patient, he is not interested in dealing to [facility] any longer. States that his experience with [facility] has not been a good one and also the distance that he has to drive ([id number] miles round trip) is not worth the hassle. Advised the patient that we are trying to help him the best we can of getting him the best care and service. Advised him that I would call him back with some sort of answer hopefully today as to an action plan as to what he can do to get this settled.  ------ Almyra FreeLibby, with the patient refusing to further use [facility] and the information above about getting a new machine/replacement through [facility], what are the patient's options? The patient states that [facility] was advised [facility] when [facility] by there the other day that he should be able to just repeat his sleep study or do a HST and once OSA is proven get a new machine from [facility] and start from scratch -- completely bypass [facility]. Is this a valid option? If so, the patient is wanting to do this ASAP!  Please advise Almyra FreeLibby, see me if any questions or confusion with this message. Thanks.  Pt wants  a call back today

## 2014-10-13 ENCOUNTER — Encounter (HOSPITAL_COMMUNITY): Payer: Self-pay | Admitting: *Deleted

## 2014-10-13 ENCOUNTER — Emergency Department (HOSPITAL_COMMUNITY): Payer: 59

## 2014-10-13 ENCOUNTER — Emergency Department (HOSPITAL_COMMUNITY)
Admission: EM | Admit: 2014-10-13 | Discharge: 2014-10-13 | Disposition: A | Discharge status: Home / Self Care | Payer: 59 | Attending: Emergency Medicine | Admitting: Emergency Medicine

## 2014-10-13 DIAGNOSIS — J45909 Unspecified asthma, uncomplicated: Secondary | ICD-10-CM | POA: Insufficient documentation

## 2014-10-13 DIAGNOSIS — E119 Type 2 diabetes mellitus without complications: Secondary | ICD-10-CM | POA: Diagnosis not present

## 2014-10-13 DIAGNOSIS — R05 Cough: Secondary | ICD-10-CM | POA: Diagnosis present

## 2014-10-13 DIAGNOSIS — G4730 Sleep apnea, unspecified: Secondary | ICD-10-CM | POA: Insufficient documentation

## 2014-10-13 DIAGNOSIS — M791 Myalgia: Secondary | ICD-10-CM | POA: Diagnosis not present

## 2014-10-13 DIAGNOSIS — J40 Bronchitis, not specified as acute or chronic: Secondary | ICD-10-CM

## 2014-10-13 DIAGNOSIS — I10 Essential (primary) hypertension: Secondary | ICD-10-CM | POA: Insufficient documentation

## 2014-10-13 DIAGNOSIS — R059 Cough, unspecified: Secondary | ICD-10-CM

## 2014-10-13 DIAGNOSIS — Z9981 Dependence on supplemental oxygen: Secondary | ICD-10-CM | POA: Insufficient documentation

## 2014-10-13 DIAGNOSIS — Z8659 Personal history of other mental and behavioral disorders: Secondary | ICD-10-CM | POA: Insufficient documentation

## 2014-10-13 DIAGNOSIS — Z79899 Other long term (current) drug therapy: Secondary | ICD-10-CM | POA: Diagnosis not present

## 2014-10-13 DIAGNOSIS — Z8619 Personal history of other infectious and parasitic diseases: Secondary | ICD-10-CM | POA: Insufficient documentation

## 2014-10-13 DIAGNOSIS — E785 Hyperlipidemia, unspecified: Secondary | ICD-10-CM | POA: Insufficient documentation

## 2014-10-13 DIAGNOSIS — Z720 Tobacco use: Secondary | ICD-10-CM | POA: Diagnosis not present

## 2014-10-13 DIAGNOSIS — Z8719 Personal history of other diseases of the digestive system: Secondary | ICD-10-CM | POA: Insufficient documentation

## 2014-10-13 MED ORDER — AZITHROMYCIN 250 MG PO TABS: ORAL_TABLET | ORAL | Status: DC

## 2014-10-13 MED ORDER — HYDROCODONE-ACETAMINOPHEN 5-325 MG PO TABS
1.0000 | ORAL_TABLET | Freq: Once | ORAL | Status: AC
  Administered 2014-10-13: 1 via ORAL
  Filled 2014-10-13: qty 1

## 2014-10-13 MED ORDER — HYDROCODONE-ACETAMINOPHEN 5-325 MG PO TABS: 1.0000 | ORAL_TABLET | Freq: Four times a day (QID) | ORAL | Status: DC | PRN

## 2014-10-13 NOTE — Discharge Instructions (Signed)
Drink plenty of fluids and follow up with your md this week. °

## 2014-10-13 NOTE — ED Provider Notes (Signed)
CSN: [id number]     Arrival date & time 10/13/14  1033 History  This chart was scribed for Scott BerkshireJoseph Antionetta Ator, MD by Elon SpannerGarrett Cook, ED Scribe. This patient was seen in room APA08/APA08 and the patient's care was started at 11:09 AM.   Chief Complaint  Patient presents with  . Cough   Patient is a 59 y.o. male presenting with cough. The history is provided by the patient (the pt complains of a cough and sob). No language interpreter was used.  Cough Cough characteristics:  Harsh Severity:  Moderate Onset quality:  Gradual Timing:  Constant Progression:  Unchanged Associated symptoms: chest pain, chills, fever and myalgias   Associated symptoms: no eye discharge, no headaches and no rash    HPI Comments: [name] is a 59 y.o. male who presents to the Emergency Department complaining of a cough with associated fever, chills, generalized body aches, and left-sided CP, and 1 episode of post-tussive emesis.  [name] is a current 1 pack/day smoker.  He denies abdominal pain  Past Medical History  Diagnosis Date  . ALLERGIC RHINITIS 03/01/2007  . ASTHMA 03/01/2007  . ERECTILE DYSFUNCTION 03/01/2007  . HYPERGLYCEMIA 03/01/2007  . HYPERLIPIDEMIA 03/01/2007  . HYPERTENSION 03/01/2007  . SHINGLES 03/01/2007  . Hx of echocardiogram     done by [facility]   . Shortness of breath   . Sleep apnea     sees Dr. Shelle Ironlance - sleep study- 06/2011, uses CPAP q night   . Diabetes mellitus without complication     , borderline , per pt. , per Dr. Everardo AllEllison  . H/O hiatal hernia   . Arthritis     avascular necrosis - of L shoulder, knees- painful at time s   Past Surgical History  Procedure Laterality Date  . Stress cardiolite  11/01/2001  . Shoulder surgery  2005    Left  . Fx humerus      Left- 2005, required 2 surgeries   . Shoulder hemi-arthroplasty  04/11/2012    Procedure: SHOULDER HEMI-ARTHROPLASTY;  Surgeon: Mable ParisJustin William Chandler, MD;  [facility];  Service: Orthopedics;  Laterality: Left;  LEFT  SHOULDER HEMIARTHROPLASTY    Family History  Problem Relation Age of Onset  . Cancer Neg Hx    History  Substance Use Topics  . Smoking status: Current Every Day Smoker -- 1.00 packs/day for 31 years    Types: Cigarettes  . Smokeless tobacco: Former NeurosurgeonUser     Comment: smoking less than 1/2 a day (10/08/14)  . Alcohol Use: No    Review of Systems  Constitutional: Positive for fever and chills. Negative for appetite change and fatigue.  HENT: Negative for congestion, ear discharge and sinus pressure.   Eyes: Negative for discharge.  Respiratory: Positive for cough.   Cardiovascular: Positive for chest pain.  Gastrointestinal: Negative for abdominal pain and diarrhea.  Genitourinary: Negative for frequency and hematuria.  Musculoskeletal: Positive for myalgias. Negative for back pain.  Skin: Negative for rash.  Neurological: Negative for seizures and headaches.  Psychiatric/Behavioral: Negative for hallucinations.      Allergies  Review of patient's allergies indicates no known allergies.  Home Medications   Prior to Admission medications   Medication Sig Start Date End Date Taking? Authorizing Provider  albuterol (PROVENTIL HFA;VENTOLIN HFA) 108 (90 BASE) MCG/ACT inhaler Inhale 1-2 puffs into the lungs every 6 (six) hours as needed for wheezing or shortness of breath. 09/19/14  Yes Zadie Rhineonald Wickline, MD  busPIRone (BUSPAR) 30 MG tablet Take 1  tablet (30 mg total) by mouth 2 (two) times daily. 05/29/14  Yes Romero Belling, MD  Chlorpheniramine-DM (CORICIDIN HBP COUGH/COLD PO) Take 2 capsules by mouth every 6 (six) hours as needed (cough).   Yes Historical Provider, MD  losartan-hydrochlorothiazide (HYZAAR) 100-25 MG per tablet TAKE 1 TABLET BY MOUTH EVERY DAY WITH SUPPER 05/17/14  Yes Romero Belling, MD  lovastatin (MEVACOR) 40 MG tablet Take 1 tablet (40 mg total) by mouth at bedtime. 09/21/14  Yes Romero Belling, MD  traMADol (ULTRAM) 50 MG tablet Take 1 tablet (50 mg total) by mouth  every 6 (six) hours as needed. for pain 09/21/14  Yes Romero Belling, MD   BP 123/69 mmHg  Pulse 85  Temp(Src) 98.5 F (36.9 C) (Oral)  Resp 20  Ht  (1.905 m)  Wt 345 lb (156.491 kg)  BMI 43.12 kg/m2  SpO2 98% Physical Exam  Constitutional: He is oriented to person, place, and time. He appears well-developed and well-nourished. No distress.  HENT:  Head: Normocephalic and atraumatic.  Eyes: Conjunctivae and EOM are normal.  Neck: Neck supple. No tracheal deviation present.  Cardiovascular: Normal rate.   Pulmonary/Chest: Effort normal. No respiratory distress.  Musculoskeletal: Normal range of motion.  Neurological: He is alert and oriented to person, place, and time.  Skin: Skin is warm and dry.  Psychiatric: He has a normal mood and affect. His behavior is normal.  Nursing note and vitals reviewed.   ED Course  Procedures (including critical care time)  DIAGNOSTIC STUDIES: Oxygen Saturation is 98% on RA, normal by my interpretation.    COORDINATION OF CARE:  11:12 AM Discussed treatment plan with patient at bedside.  Patient acknowledges and agrees with plan.    Labs Review Labs Reviewed - No data to display  Imaging Review No results found.   EKG Interpretation None      MDM   Final diagnoses:  None  bronchitis,  tx with zpak and vicodin and follow up The chart was scribed for me under my direct supervision.  I personally performed the history, physical, and medical decision making and all procedures in the evaluation of this patient.Scott Berkshire, MD 10/13/14 [phone]

## 2014-10-13 NOTE — ED Notes (Signed)
Multiple complaints. Pt states productive cough, grey and brown in color, sometimes clear x 2 days. States he has trouble catching his breath at times, especially after coughing spell and cough induced vomiting. States soreness to left chest/rib area when coughing. Also states soreness to left side when coughing as well. States he generally does not feel well.

## 2014-10-15 NOTE — Telephone Encounter (Signed)
[facility] coverage.  LMTCB x 1 for patient to make aware of plan if insurance will cover.

## 2014-10-15 NOTE — Telephone Encounter (Signed)
I called and spoke with representative from the patient's insurance. They stated the only in network DME companies are [facility], [facility], and [employer]. They stated that [facility] was not in network with his insurance.  If he chose [facility] he would have to pay out of pocket. No matter which company he chooses they will have to put in a notification for repair or replace for the insurance to approve. Is the patient buying or renting the machine.  The insurance company stated that the patient could call them and they would try to explain the process to him.

## 2014-10-15 NOTE — Telephone Encounter (Signed)
Please confirm that his insurance will cover a home sleep study.  If this is covered, then he can do home sleep study.  If not, then he will need in lab sleep study.

## 2014-10-16 NOTE — Telephone Encounter (Signed)
LMTC x `1 for pt 

## 2014-10-16 NOTE — Telephone Encounter (Signed)
Spoke with pt.  He states that he has had this machine for 3 years but doesn't know if he is buying or renting it.    Pt is willing to try [facility] in [place] since this is much closer to his home..  Order placed.

## 2014-10-22 ENCOUNTER — Other Ambulatory Visit: Payer: Self-pay | Admitting: Endocrinology

## 2014-11-02 ENCOUNTER — Telehealth: Payer: Self-pay | Admitting: Pulmonary Disease

## 2014-11-05 NOTE — Telephone Encounter (Signed)
error 

## 2014-11-06 ENCOUNTER — Telehealth: Payer: Self-pay | Admitting: Pulmonary Disease

## 2014-11-06 DIAGNOSIS — G4733 Obstructive sleep apnea (adult) (pediatric): Secondary | ICD-10-CM

## 2014-11-06 DIAGNOSIS — Z9989 Dependence on other enabling machines and devices: Principal | ICD-10-CM

## 2014-11-06 NOTE — Telephone Encounter (Signed)
Called and spoke with patient and advised patient that cpap supplies order has been faxed to [facility] to provide. Pt got his machine from [facility], and advised pt that if he felt like this machine was broken or not working properly, that he would need to take machine over to [facility] for them to check to see if this machine could be repaired. [facility] should provide a loaner machine while pt's machine is being repaired. If machine can not be repaired then [facility] should provide another CPAP machine for patient. Pt is aware and stated that he would take machine over to [facility] tomorrow morning. Nothing else needed at this time. Rhonda J Cobb

## 2014-11-06 NOTE — Telephone Encounter (Signed)
-----   Message from Tobe Sos sent at 11/05/2014  3:37 PM EDT ----- Regarding: RE: PRIOR AUTH HAS BEEN DENIED FOR NEW CPAP Contact: [phone] Ok then put in an order for new npsg thanks  ----- Message -----    From: Coralyn Helling, MD    Sent: 11/05/2014   2:03 PM      To: Tobe Sos Subject: RE: PRIOR AUTH HAS BEEN DENIED FOR NEW CPAP    Is insurance requiring new sleep study?  If so, then he will need to have sleep study done first.  Let me know, and then I can put in order.  V   ----- Message -----    From: Tobe Sos    Sent: 11/05/2014  11:04 AM      To: Coralyn Helling, MD Subject: RE: PRIOR AUTH HAS BEEN DENIED FOR NEW CPAP    Do you want to do a peer to peer on this pt to get his cpap?  ----- Message -----    From: Barbaraann Share, MD    Sent: 11/02/2014   5:30 PM      To: Everette Rank Ottinger Subject: RE: PRIOR AUTH HAS BEEN DENIED FOR NEW CPAP    Having a hard time following this string.  Saw Philopateer Strine on 5/16.  I do not do peer to peer for a new cpap machine.  If I was the pt, would do an appeal and see if will go thru.   ----- Message -----    From: Tobe Sos    Sent: 11/01/2014  12:56 PM      To: Barbaraann Share, MD Subject: FW: PRIOR AUTH HAS BEEN DENIED FOR NEW CPAP    i need to let you see this info   ----- Message -----    From: Levora Dredge    Sent: 10/30/2014  10:34 AM      To: Dene Gentry, [place], # Subject: PRIOR AUTH HAS BEEN DENIED FOR NEW CPAP        Good Morning,   [employer] has denied the patient Prior Auth to get a New Cpap machine. I spoke with Elnita Maxwell and she stated that the Medical Director stated that the office notes seem to say that patient is just needing supplies not that the machine. She stated that the doctor can call her directly to do a peer to peer review [phone]. The patient is aware of this and is wanting to know what is going to be done. Please follow up on this account and let me know  so we can assist patient.  Thanks [employer]

## 2014-11-06 NOTE — Telephone Encounter (Signed)
He has an auto CPAP already.  He should not need new CPAP machine unless his current machine is broken >> it should then be assessed by his DME first.  He should not need a new sleep study either.  All he needed based on my assessment from 10/08/14 was new CPAP supplies >> mask/tubes/filters.  There shouldn't be need for peer to peer for new CPAP supplies.  I have placed order for his DME to arrange for new CPAP supplies.

## 2014-11-19 ENCOUNTER — Telehealth: Payer: Self-pay

## 2014-11-19 ENCOUNTER — Telehealth: Payer: Self-pay | Admitting: Endocrinology

## 2014-11-19 MED ORDER — SILDENAFIL CITRATE 100 MG PO TABS: 50.0000 mg | ORAL_TABLET | Freq: Every day | ORAL | Status: DC | PRN

## 2014-11-19 NOTE — Telephone Encounter (Signed)
Pt advise rx has been submitted.

## 2014-11-19 NOTE — Telephone Encounter (Signed)
Pt called and said he cant afford his prescription, please call pt

## 2014-11-19 NOTE — Telephone Encounter (Signed)
I contacted the pt. He wanted to advise me he would not be able to afford the Viagra prescription. Pt was advised to contact our office if he need anything further.

## 2014-11-19 NOTE — Telephone Encounter (Signed)
Pt called requesting a prescription for Viagra. This medication is not on his current med list.  Please advise if ok to prescribe this medication. Thanks!

## 2014-11-19 NOTE — Telephone Encounter (Signed)
Please see below. Thanks!

## 2014-11-19 NOTE — Telephone Encounter (Signed)
i have sent a prescription to your pharmacy 

## 2014-11-20 ENCOUNTER — Other Ambulatory Visit: Payer: Self-pay | Admitting: Endocrinology

## 2014-11-21 ENCOUNTER — Telehealth: Payer: Self-pay | Admitting: Endocrinology

## 2014-11-21 NOTE — Telephone Encounter (Signed)
See note below and please advise, Thanks! 

## 2014-11-21 NOTE — Telephone Encounter (Signed)
Pt called confirming he will be here at 9 am tomorrow will you add him to schedule and call him back.

## 2014-11-21 NOTE — Telephone Encounter (Signed)
Pt requesting call back he is having anxiety issues and doesn't know if he should come in and get meds to help

## 2014-11-21 NOTE — Telephone Encounter (Signed)
Pt added to the scheduled for 9 am tomorrow.

## 2014-11-21 NOTE — Telephone Encounter (Signed)
I contacted the [name] and advised Dr. Everardo AllEllison is willing to see him tomorrow at 9 am. [name] to call back and let us know if he would like this appointment day and time.

## 2014-11-21 NOTE — Telephone Encounter (Signed)
Ov 9 am tomorrow

## 2014-11-22 ENCOUNTER — Ambulatory Visit (INDEPENDENT_AMBULATORY_CARE_PROVIDER_SITE_OTHER): Payer: 59 | Admitting: Endocrinology

## 2014-11-22 ENCOUNTER — Encounter: Payer: Self-pay | Admitting: Endocrinology

## 2014-11-22 VITALS — BP 112/60 | HR 94 | Temp 97.5°F | Ht 75.0 in | Wt 339.0 lb

## 2014-11-22 DIAGNOSIS — F411 Generalized anxiety disorder: Secondary | ICD-10-CM

## 2014-11-22 MED ORDER — ALPRAZOLAM 0.25 MG PO TABS: 0.2500 mg | ORAL_TABLET | Freq: Three times a day (TID) | ORAL | Status: DC | PRN

## 2014-11-22 MED ORDER — DULOXETINE HCL 30 MG PO CPEP: 30.0000 mg | ORAL_CAPSULE | Freq: Every day | ORAL | Status: DC

## 2014-11-22 NOTE — Patient Instructions (Addendum)
i have sent a prescription to your pharmacy, for "cymbalta."  This can interact with the tramadol, and it can also help the pain, so please try to minimize the tramadol.   Here is a prescription for "alprazolam."  This is habit-forming over time, so you should try to minimize it also.   If you are not getting better, please call so i can refer you to a specialist.

## 2014-11-22 NOTE — Progress Notes (Signed)
Subjective:    Patient ID: [name], male    DOB: [birth year], 59 y.o.   MRN: [id number]  HPI Pt says anxiety has recurred, due to family issues--he cares for 6 grandchildren.  He denies EtOH/drugs/domestic violence/suicidal thoughts.  buspar no longer helps.   Tramadol helps pain.  Past Medical History  Diagnosis Date  . ALLERGIC RHINITIS 03/01/2007  . ASTHMA 03/01/2007  . ERECTILE DYSFUNCTION 03/01/2007  . HYPERGLYCEMIA 03/01/2007  . HYPERLIPIDEMIA 03/01/2007  . HYPERTENSION 03/01/2007  . SHINGLES 03/01/2007  . Hx of echocardiogram     done by [facility]   . Shortness of breath   . Sleep apnea     sees Dr. Shelle Ironlance - sleep study- 06/2011, uses CPAP q night   . Diabetes mellitus without complication     , borderline , per pt. , per Dr. Everardo AllEllison  . H/O hiatal hernia   . Arthritis     avascular necrosis - of L shoulder, knees- painful at time s    Past Surgical History  Procedure Laterality Date  . Stress cardiolite  11/01/2001  . Shoulder surgery  2005    Left  . Fx humerus      Left- 2005, required 2 surgeries   . Shoulder hemi-arthroplasty  04/11/2012    Procedure: SHOULDER HEMI-ARTHROPLASTY;  Surgeon: Mable ParisJustin William Chandler, MD;  [facility];  Service: Orthopedics;  Laterality: Left;  LEFT SHOULDER HEMIARTHROPLASTY     History   Social History  . Marital Status: Married    Spouse Name: N/A  . Number of Children: 2  . 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 Smoking status: Current Every Day Smoker -- 1.00 packs/day for 31 years    Types: Cigarettes  . Smokeless tobacco: Former NeurosurgeonUser     Comment: smoking less than 1/2 a day (10/08/14)  . Alcohol Use: No  . Drug Use: No  . Sexual Activity: Not on file   Other Topics Concern  . Not on file   Social History Narrative    Current Outpatient Prescriptions on File Prior to Visit  Medication Sig Dispense Refill  . albuterol (PROVENTIL HFA;VENTOLIN HFA)  108 (90 BASE) MCG/ACT inhaler Inhale 1-2 puffs into the lungs every 6 (six) hours as needed for wheezing or shortness of breath. 1 Inhaler 0  . losartan-hydrochlorothiazide (HYZAAR) 100-25 MG per tablet TAKE 1 TABLET BY MOUTH EVERY DAY WITH SUPPER 30 tablet 0  . lovastatin (MEVACOR) 40 MG tablet Take 1 tablet (40 mg total) by mouth at bedtime. 30 tablet 6  . traMADol (ULTRAM) 50 MG tablet TAKE ONE TABLET BY MOUTH EVERY 6 HOURS AS NEEDED FOR PAIN 50 tablet 0  . Chlorpheniramine-DM (CORICIDIN HBP COUGH/COLD PO) Take 2 capsules by mouth every 6 (six) hours as needed (cough).    . sildenafil (VIAGRA) 100 MG tablet Take 0.5-1 tablets (50-100 mg total) by mouth daily as needed for erectile dysfunction. (Patient not taking: Reported on 11/22/2014) 5 tablet 11   No current facility-administered medications on file prior to visit.    No Known Allergies  Family History  Problem Relation Age of Onset  . Cancer Neg Hx     BP 112/60 mmHg  Pulse 94  Temp(Src) 97.5 F (36.4 C) (Oral)  Ht 6\' 3"  (1.905 m)  Wt 339 lb (153.769 kg)  BMI 42.37 kg/m2  SpO2 97%    Review of Systems He has lost a  few lbs.    Objective:   Physical Exam VITAL SIGNS:  See vs page GENERAL: no distress PSYCH: Alert and well-oriented.  appears anxious but not depressed.       Assessment & Plan:  Anxiety, moderate exacerbation  Patient is advised the following: Patient Instructions  i have sent a prescription to your pharmacy, for "cymbalta."  This can interact with the tramadol, and it can also help the pain, so please try to minimize the tramadol.   Here is a prescription for "alprazolam."  This is habit-forming over time, so you should try to minimize it also.   If you are not getting better, please call so i can refer you to a specialist.

## 2014-12-03 ENCOUNTER — Telehealth: Payer: Self-pay | Admitting: Endocrinology

## 2014-12-03 NOTE — Telephone Encounter (Signed)
That is as good as we can expect. Please continue the same medications

## 2014-12-03 NOTE — Telephone Encounter (Signed)
Patient advised of note below and voiced understanding.  

## 2014-12-03 NOTE — Telephone Encounter (Signed)
See note below to be advised. 

## 2014-12-03 NOTE — Telephone Encounter (Signed)
Pt is feeling much better but is still having bouts of stress and aggravation, but much better than before

## 2014-12-13 ENCOUNTER — Telehealth: Payer: Self-pay | Admitting: Endocrinology

## 2014-12-13 NOTE — Telephone Encounter (Signed)
Please call [phone] to register for the meeting.

## 2014-12-13 NOTE — Telephone Encounter (Signed)
Pt wanting to see if he is still eligible for the weight reduction surgery?

## 2014-12-13 NOTE — Telephone Encounter (Signed)
Left a voicemail advising of note below. 

## 2014-12-13 NOTE — Telephone Encounter (Signed)
See note below and please advise, Thanks! 

## 2014-12-13 NOTE — Telephone Encounter (Signed)
I contacted the pt and advised of note below. He would like a referral to be placed for weight loss surgery to be placed.

## 2014-12-13 NOTE — Telephone Encounter (Signed)
Yes, i think it is a good idea

## 2014-12-17 ENCOUNTER — Telehealth: Payer: Self-pay | Admitting: Endocrinology

## 2014-12-17 NOTE — Telephone Encounter (Signed)
Pt requesting a call back regarding the weight loss surgery, [facility] is not apart of his insurance-no [facility] compass is taken

## 2014-12-19 NOTE — Telephone Encounter (Signed)
Please forward this message to [facility], to ask what surg group near [place] takes this insurance

## 2014-12-20 ENCOUNTER — Other Ambulatory Visit: Payer: Self-pay | Admitting: Endocrinology

## 2014-12-20 NOTE — Telephone Encounter (Signed)
Could you review the notes below and please advise the pt? Thanks!

## 2014-12-21 ENCOUNTER — Telehealth: Payer: Self-pay | Admitting: Endocrinology

## 2014-12-21 ENCOUNTER — Other Ambulatory Visit: Payer: Self-pay | Admitting: Endocrinology

## 2014-12-21 MED ORDER — TRAMADOL HCL 50 MG PO TABS: 50.0000 mg | ORAL_TABLET | Freq: Four times a day (QID) | ORAL | Status: DC | PRN

## 2014-12-21 NOTE — Telephone Encounter (Signed)
°

## 2014-12-21 NOTE — Telephone Encounter (Signed)
°  Patient called stating that his pharmacy has sent over refill request for his Rx  Mr. [name] states he is out and would need a refill today   Rx: Tramadol  Pharmacy: [facility] [place]   Thank you

## 2014-12-21 NOTE — Telephone Encounter (Signed)
Pt was informed that he must come in and pick up Rx.Pt stated he would do so on Monday morning.

## 2014-12-28 ENCOUNTER — Other Ambulatory Visit: Payer: Self-pay | Admitting: Endocrinology

## 2014-12-31 ENCOUNTER — Telehealth: Payer: Self-pay

## 2014-12-31 ENCOUNTER — Other Ambulatory Visit: Payer: Self-pay

## 2014-12-31 MED ORDER — ALPRAZOLAM 0.25 MG PO TABS: 0.2500 mg | ORAL_TABLET | Freq: Three times a day (TID) | ORAL | Status: DC | PRN

## 2014-12-31 NOTE — Telephone Encounter (Signed)
Rx has been signed and faxed  

## 2014-12-31 NOTE — Telephone Encounter (Signed)
I printed out his Rx for Xanax could you get Dr.Ellison to sign it and possibly fax this to his pharmacy.

## 2015-01-09 ENCOUNTER — Other Ambulatory Visit: Payer: Self-pay | Admitting: Endocrinology

## 2015-01-09 ENCOUNTER — Telehealth: Payer: Self-pay | Admitting: Endocrinology

## 2015-01-09 DIAGNOSIS — F411 Generalized anxiety disorder: Secondary | ICD-10-CM

## 2015-01-09 NOTE — Telephone Encounter (Signed)
Patient asked you to call him, he did not tell me what about.

## 2015-01-09 NOTE — Telephone Encounter (Signed)
Noted  

## 2015-01-09 NOTE — Telephone Encounter (Signed)
[facility] note dated 01/08/15 5:18 PM Initial Comment Caller States he needs a refill on his prescription, was told by the pharmacy that the refill was denied by the doctor Confirm and document reason for call. If symptomatic, describe symptoms. ---Duke Salvia has seen the MD within the last 3 months; Went to call his Xanax refill in pharmacy stated it would be filled by 5pm went to pick it up and pharmacist advised MD refused to fill the Medication. Please document clinical information provided and list any resource used. ---Nurse advised that FDA regulations reclassified several drugs, xanax being one and this means it will require a hard copy EACH time it is needed. Each state has different compliance dates for each state to do so. Nurse does not know why this happened to him and the best advise is to contact MD in am and discuss this with him. It could be a simple fix.

## 2015-01-09 NOTE — Telephone Encounter (Signed)
I contacted the pt. Pt is requesting a refill on his Xanax. Last week the pt's bother died and he increased his medication. Pt has been taking 2 tablet by mouth 3 times daily. Pt wanted to know if we could write another rx? Please advise, Thanks!

## 2015-01-09 NOTE — Telephone Encounter (Signed)
Patient ask you to call him concerning his medication, he is not understanding why it was denied. Please advise

## 2015-01-09 NOTE — Telephone Encounter (Signed)
please call patient: This medication is not a good choice to take on an ongoing basis. As we discussed, please see a specialist.  you will receive a phone call, about a day and time for an appointment

## 2015-01-10 NOTE — Telephone Encounter (Signed)
Pt advised of note below and declined to see a therapist at this time.

## 2015-01-20 ENCOUNTER — Other Ambulatory Visit: Payer: Self-pay | Admitting: Endocrinology

## 2015-01-21 ENCOUNTER — Telehealth: Payer: Self-pay | Admitting: Endocrinology

## 2015-01-21 NOTE — Telephone Encounter (Signed)
I requested a call back from the pt to discuss.

## 2015-01-21 NOTE — Telephone Encounter (Signed)
Pt requesting call back please °

## 2015-01-29 ENCOUNTER — Telehealth: Payer: Self-pay | Admitting: Endocrinology

## 2015-01-29 ENCOUNTER — Other Ambulatory Visit: Payer: Self-pay | Admitting: Endocrinology

## 2015-01-29 NOTE — Telephone Encounter (Signed)
See note below and please advise if we can refill this medication.

## 2015-01-29 NOTE — Telephone Encounter (Signed)
i printed 

## 2015-01-29 NOTE — Telephone Encounter (Signed)
Pt advised rx has been sent.  

## 2015-01-29 NOTE — Telephone Encounter (Signed)
Can we call him regarding the xanax rx he needs a refill

## 2015-02-04 ENCOUNTER — Telehealth: Payer: Self-pay | Admitting: Endocrinology

## 2015-02-04 NOTE — Telephone Encounter (Signed)
Error

## 2015-02-11 ENCOUNTER — Other Ambulatory Visit: Payer: Self-pay | Admitting: Endocrinology

## 2015-02-18 ENCOUNTER — Other Ambulatory Visit: Payer: Self-pay | Admitting: Endocrinology

## 2015-02-25 ENCOUNTER — Other Ambulatory Visit: Payer: Self-pay | Admitting: Endocrinology

## 2015-03-11 ENCOUNTER — Other Ambulatory Visit: Payer: Self-pay | Admitting: Endocrinology

## 2015-03-20 ENCOUNTER — Other Ambulatory Visit: Payer: Self-pay | Admitting: Endocrinology

## 2015-03-21 ENCOUNTER — Telehealth: Payer: Self-pay | Admitting: Endocrinology

## 2015-03-21 NOTE — Telephone Encounter (Signed)
Patient advised of note below and voiced understanding.  

## 2015-03-21 NOTE — Telephone Encounter (Signed)
Patient scheduled for 04/01/2015. Patient is ok with waiting until then for his rx to be refilled.

## 2015-03-21 NOTE — Telephone Encounter (Signed)
Ov needed for any more refills

## 2015-03-21 NOTE — Telephone Encounter (Signed)
please call patient: i have printed rx, so he has it pending f/u ov

## 2015-03-22 ENCOUNTER — Emergency Department (HOSPITAL_COMMUNITY)
Admission: EM | Admit: 2015-03-22 | Discharge: 2015-03-22 | Disposition: A | Discharge status: Home / Self Care | Payer: 59 | Attending: Emergency Medicine | Admitting: Emergency Medicine

## 2015-03-22 ENCOUNTER — Emergency Department (HOSPITAL_COMMUNITY): Payer: 59

## 2015-03-22 ENCOUNTER — Encounter (HOSPITAL_COMMUNITY): Payer: Self-pay | Admitting: *Deleted

## 2015-03-22 DIAGNOSIS — Z8669 Personal history of other diseases of the nervous system and sense organs: Secondary | ICD-10-CM | POA: Diagnosis not present

## 2015-03-22 DIAGNOSIS — Z87438 Personal history of other diseases of male genital organs: Secondary | ICD-10-CM | POA: Diagnosis not present

## 2015-03-22 DIAGNOSIS — Z8719 Personal history of other diseases of the digestive system: Secondary | ICD-10-CM | POA: Insufficient documentation

## 2015-03-22 DIAGNOSIS — Z79899 Other long term (current) drug therapy: Secondary | ICD-10-CM | POA: Diagnosis not present

## 2015-03-22 DIAGNOSIS — M79672 Pain in left foot: Secondary | ICD-10-CM | POA: Diagnosis present

## 2015-03-22 DIAGNOSIS — Z8619 Personal history of other infectious and parasitic diseases: Secondary | ICD-10-CM | POA: Insufficient documentation

## 2015-03-22 DIAGNOSIS — I10 Essential (primary) hypertension: Secondary | ICD-10-CM | POA: Insufficient documentation

## 2015-03-22 DIAGNOSIS — S90425A Blister (nonthermal), left lesser toe(s), initial encounter: Secondary | ICD-10-CM

## 2015-03-22 DIAGNOSIS — M1990 Unspecified osteoarthritis, unspecified site: Secondary | ICD-10-CM | POA: Insufficient documentation

## 2015-03-22 DIAGNOSIS — L988 Other specified disorders of the skin and subcutaneous tissue: Secondary | ICD-10-CM | POA: Diagnosis not present

## 2015-03-22 DIAGNOSIS — Z720 Tobacco use: Secondary | ICD-10-CM | POA: Diagnosis not present

## 2015-03-22 DIAGNOSIS — E785 Hyperlipidemia, unspecified: Secondary | ICD-10-CM | POA: Insufficient documentation

## 2015-03-22 DIAGNOSIS — J45909 Unspecified asthma, uncomplicated: Secondary | ICD-10-CM | POA: Diagnosis not present

## 2015-03-22 LAB — CBC WITH DIFFERENTIAL/PLATELET
BASOS ABS: 0 10*3/uL (ref 0.0–0.1)
BASOS PCT: 1 %
EOS ABS: 0.1 10*3/uL (ref 0.0–0.7)
EOS PCT: 2 %
HCT: 44.9 % (ref 39.0–52.0)
Hemoglobin: 15.4 g/dL (ref 13.0–17.0)
Lymphocytes Relative: 21 %
Lymphs Abs: 1.6 10*3/uL (ref 0.7–4.0)
MCH: 30.7 pg (ref 26.0–34.0)
MCHC: 34.3 g/dL (ref 30.0–36.0)
MCV: 89.6 fL (ref 78.0–100.0)
Monocytes Absolute: 0.5 10*3/uL (ref 0.1–1.0)
Monocytes Relative: 7 %
NEUTROS PCT: 69 %
Neutro Abs: 5.2 10*3/uL (ref 1.7–7.7)
PLATELETS: 163 10*3/uL (ref 150–400)
RBC: 5.01 MIL/uL (ref 4.22–5.81)
RDW: 12.9 % (ref 11.5–15.5)
WBC: 7.5 10*3/uL (ref 4.0–10.5)

## 2015-03-22 LAB — BASIC METABOLIC PANEL
ANION GAP: 9 (ref 5–15)
BUN: 14 mg/dL (ref 6–20)
CALCIUM: 9.1 mg/dL (ref 8.9–10.3)
CO2: 24 mmol/L (ref 22–32)
Chloride: 100 mmol/L — ABNORMAL LOW (ref 101–111)
Creatinine, Ser: 0.79 mg/dL (ref 0.61–1.24)
GLUCOSE: 107 mg/dL — AB (ref 65–99)
POTASSIUM: 4 mmol/L (ref 3.5–5.1)
Sodium: 133 mmol/L — ABNORMAL LOW (ref 135–145)

## 2015-03-22 MED ORDER — SULFAMETHOXAZOLE-TRIMETHOPRIM 800-160 MG PO TABS: 1.0000 | ORAL_TABLET | Freq: Two times a day (BID) | ORAL | Status: AC

## 2015-03-22 MED ORDER — BACITRACIN ZINC 500 UNIT/GM EX OINT
TOPICAL_OINTMENT | CUTANEOUS | Status: AC
  Filled 2015-03-22: qty 0.9

## 2015-03-22 MED ORDER — HYDROCODONE-ACETAMINOPHEN 5-325 MG PO TABS
1.0000 | ORAL_TABLET | Freq: Once | ORAL | Status: AC
  Administered 2015-03-22: 1 via ORAL
  Filled 2015-03-22: qty 1

## 2015-03-22 MED ORDER — CEPHALEXIN 500 MG PO CAPS
500.0000 mg | ORAL_CAPSULE | Freq: Four times a day (QID) | ORAL | Status: DC
Start: 2015-03-22 — End: 2015-12-03

## 2015-03-22 MED ORDER — LIDOCAINE HCL (PF) 2 % IJ SOLN
10.0000 mL | Freq: Once | INTRAMUSCULAR | Status: DC
  Filled 2015-03-22: qty 10

## 2015-03-22 NOTE — Discharge Instructions (Signed)
Blisters Take the antibiotic as prescribed and follow-up with the foot doctor. Return to the ED if you develop worsening pain, fever, spreading redness or drainage of pus or any other concerns. A blister is a fluid-filled sac that forms between layers of skin. Blisters often form in areas where skin rubs against other skin or rubs against something else. The most common areas for blisters are the hands and feet. CAUSES A blister can be caused by:  An injury.  A burn.  An allergic reaction.  An infection.  Exposure to irritating chemicals.  Friction. Friction blisters often result from:  Sports.  Repetitive activities.  Shoes that are too tight or too loose. SIGNS AND SYMPTOMS A blister is often round and looks like a bump. It may itch or be painful to the touch. The liquid in a blister is clear or bloody. Before a blister forms, the skin may become red, feel warm, itch, or be painful to the touch. DIAGNOSIS A blister can usually be diagnosed from its appearance. TREATMENT Treatment involves protecting the area where the blister has formed until the skin has healed. If something is likely to rub against the blister, apply a bandage (dressing) with a hole in the middle over the blister. Most blisters break open, dry up, and go away on their own within 10 days. Rarely, blisters that are very painful may be drained before they break open on their own. Draining of a blister should only be done by a health care provider under sterile conditions. HOME CARE INSTRUCTIONS  Protect the area where the blister has formed as directed by your health care provider.  Do not open or pop your blister, because it could become infected.  If the blister is very painful, ask your health care provider whether you should have it drained.  If the blister breaks open on its own:  Do not remove the loose skin that is over the blister.  Wash the blister area with soap and water every day.  After  washing the blister area, you may apply an antibiotic cream or ointment and cover the area with a bandage. PREVENTION Taking these steps can help to prevent blisters that are caused by friction:  Wear comfortable shoes that fit well.  Always wear socks with shoes.  Wear extra socks or use tape, bandages, or pads over blister-prone areas as needed.  Wear protective gear, such as gloves, when participating in sports or activities that can cause blisters.  Use powders as needed to keep your feet dry. SEEK MEDICAL CARE IF:  You have increased redness, swelling, or pain in the blister area.  A puslike discharge is coming from the blister area.  You have a fever.  You have chills.   This information is not intended to replace advice given to you by your health care provider. Make sure you discuss any questions you have with your health care provider.   Document Released: 06/18/2004 Document Revised: 06/01/2014 Document Reviewed: 12/09/2013 Elsevier Interactive Patient Education [employer]2016 Elsevier Inc.

## 2015-03-22 NOTE — ED Notes (Signed)
Pt states swelling and blistering to left foot x 3 days, causing "stabbing" pain

## 2015-03-22 NOTE — ED Notes (Signed)
Dressing with bacitracin and telfa applied

## 2015-03-22 NOTE — ED Provider Notes (Signed)
CSN: [id number]     Arrival date & time 03/22/15  1030 History  By signing my name below, I, Lyndel Safe, attest that this documentation has been prepared under the direction and in the presence of Glynn Octave, MD. Electronically Signed: Lyndel Safe, ED Scribe. 03/22/2015. 11:05 AM.   Chief Complaint  Patient presents with  . Foot Pain   The history is provided by the patient. No language interpreter was used.   HPI Comments: [name] is a 59 y.o. male, with a PMhx of COPD and HLD, who presents to the Emergency Department complaining of a gradually worsening, constant pain and swelling to left foot X 3 days. His pain is attributed to several intact bulla to medial left foot and plantar surface of left foot. He denies injury or trauma attributable to the blisters. The pt has a history of a blister to the plantar surface of his left great toe that was incised and drained in the ED 1 year ago. He was prescribed antibiotics on discharge from this past visit and he states the wound healed well with this treatment course. The pt's current pain is worse with palpation and weight bearing. He reports he is borderline diabetic but does not take medication. He endorses feeling weak and nauseated but denies fevers, vomiting, or abdominal pain. Also denies new shoes or any differing of conditions or environment. Tetanus UTD. Pt has an appointment in 3 days with PCP.   Past Medical History  Diagnosis Date  . ALLERGIC RHINITIS 03/01/2007  . ASTHMA 03/01/2007  . ERECTILE DYSFUNCTION 03/01/2007  . HYPERGLYCEMIA 03/01/2007  . HYPERLIPIDEMIA 03/01/2007  . HYPERTENSION 03/01/2007  . SHINGLES 03/01/2007  . Hx of echocardiogram     done by [facility]   . Shortness of breath   . Sleep apnea     sees Dr. Shelle Iron - sleep study- 06/2011, uses CPAP q night   . Diabetes mellitus without complication (HCC)     , borderline , per pt. , per Dr. [name]  . H/O hiatal hernia   . Arthritis     avascular  necrosis - of L shoulder, knees- painful at time s   Past Surgical History  Procedure Laterality Date  . Stress cardiolite  11/01/2001  . Shoulder surgery  2005    Left  . Fx humerus      Left- 2005, required 2 surgeries   . Shoulder hemi-arthroplasty  04/11/2012    Procedure: SHOULDER HEMI-ARTHROPLASTY;  Surgeon: [name], MD;  Location: [facility] OR;  Service: Orthopedics;  Laterality: Left;  LEFT SHOULDER HEMIARTHROPLASTY    Family History  Problem Relation Age of Onset  . Cancer Neg Hx    Social History  Substance Use Topics  . Smoking status: Current Every Day Smoker -- 0.50 packs/day for 31 years    Types: Cigarettes  . Smokeless tobacco: Former Neurosurgeon     Comment: smoking less than 1/2 a day (10/08/14)  . Alcohol Use: No    Review of Systems  Constitutional: Negative for fever.  Gastrointestinal: Positive for nausea. Negative for vomiting and abdominal pain.  Musculoskeletal: Positive for arthralgias ( left foot).  Skin: Positive for wound (blisters left foot ).  Neurological: Positive for weakness.  A complete 10 system review of systems was obtained and is otherwise negative except at noted in the HPI and PMH. Allergies  Review of patient's allergies indicates no known allergies.  Home Medications   Prior to Admission medications   Medication Sig Start  Date End Date Taking? Authorizing Provider  albuterol (PROVENTIL HFA;VENTOLIN HFA) 108 (90 BASE) MCG/ACT inhaler Inhale 1-2 puffs into the lungs every 6 (six) hours as needed for wheezing or shortness of breath. 09/19/14  Yes Zadie Rhineonald Wickline, MD  ALPRAZolam Prudy Feeler(XANAX) 0.25 MG tablet TAKE ONE TABLET BY MOUTH THREE TIMES DAILY AS NEEDED FOR ANXIETY 03/11/15  Yes Romero BellingSean Ellison, MD  Chlorpheniramine-DM (CORICIDIN HBP COUGH/COLD PO) Take 2 capsules by mouth every 6 (six) hours as needed (cough).   Yes Historical Provider, MD  DULoxetine (CYMBALTA) 30 MG capsule Take 1 capsule (30 mg total) by mouth daily. 11/22/14  Yes Romero BellingSean  Ellison, MD  ibuprofen (ADVIL,MOTRIN) 200 MG tablet Take 400 mg by mouth every 6 (six) hours as needed.   Yes Historical Provider, MD  losartan-hydrochlorothiazide (HYZAAR) 100-25 MG per tablet TAKE 1 TABLET BY MOUTH EVERY DAY WITH SUPPER 05/17/14  Yes Romero BellingSean Ellison, MD  lovastatin (MEVACOR) 40 MG tablet Take 1 tablet (40 mg total) by mouth at bedtime. 09/21/14  Yes Romero BellingSean Ellison, MD  traMADol (ULTRAM) 50 MG tablet Take 1 tablet (50 mg total) by mouth every 6 (six) hours as needed. for pain 12/21/14  Yes Romero BellingSean Ellison, MD  cephALEXin (KEFLEX) 500 MG capsule Take 1 capsule (500 mg total) by mouth 4 (four) times daily. 03/22/15   Glynn OctaveStephen Baden Betsch, MD  sildenafil (VIAGRA) 100 MG tablet Take 0.5-1 tablets (50-100 mg total) by mouth daily as needed for erectile dysfunction. Patient not taking: Reported on 11/22/2014 11/19/14   Romero BellingSean Ellison, MD  sulfamethoxazole-trimethoprim (BACTRIM DS,SEPTRA DS) 800-160 MG tablet Take 1 tablet by mouth 2 (two) times daily. 03/22/15 03/29/15  Glynn OctaveStephen Dempsey Knotek, MD  traMADol (ULTRAM) 50 MG tablet TAKE ONE TABLET BY MOUTH EVERY 6 HOURS AS NEEDED FOR PAIN Patient not taking: Reported on 03/22/2015 01/21/15   Romero BellingSean Ellison, MD  traMADol (ULTRAM) 50 MG tablet TAKE ONE TABLET BY MOUTH EVERY 6 HOURS AS NEEDED FOR PAIN Patient not taking: Reported on 03/22/2015 02/18/15   Romero BellingSean Ellison, MD  traMADol (ULTRAM) 50 MG tablet TAKE ONE TABLET BY MOUTH EVERY 6 HOURS AS NEEDED FOR PAIN Patient not taking: Reported on 03/22/2015 03/21/15   Romero BellingSean Ellison, MD   BP 117/87 mmHg  Pulse 79  Temp(Src) 97.7 F (36.5 C) (Oral)  Resp 16  Ht 6\' 3"  (1.905 m)  Wt 340 lb (154.223 kg)  BMI 42.50 kg/m2  SpO2 99% Physical Exam  Constitutional: He is oriented to person, place, and time. He appears well-developed and well-nourished. No distress.  HENT:  Head: Normocephalic and atraumatic.  Mouth/Throat: Oropharynx is clear and moist. No oropharyngeal exudate.  Eyes: Conjunctivae and EOM are normal. Pupils are  equal, round, and reactive to light.  Neck: Normal range of motion. Neck supple.  No meningismus.  Cardiovascular: Normal rate, regular rhythm, normal heart sounds and intact distal pulses.   No murmur heard. Pulmonary/Chest: Effort normal and breath sounds normal. No respiratory distress.  Abdominal: Soft. There is no tenderness. There is no rebound and no guarding.  Musculoskeletal: Normal range of motion. He exhibits no edema or tenderness.  Neurological: He is alert and oriented to person, place, and time. No cranial nerve deficit. He exhibits normal muscle tone. Coordination normal.  No ataxia on finger to nose bilaterally. No pronator drift. 5/5 strength throughout. CN 2-12 intact. Negative Romberg. Equal grip strength. Sensation intact. Gait is normal.   Skin: Skin is warm. There is erythema.  Left foot; 3 cm blister medial to 1st MTP joint, second blister  that is draining 1 cm near IP joint of great toe, 3rd blister that is 2 cm on plantar surface of left foot between 1st and 2nd toes, 0.5cm blister near ball of left foot, intact DP and TP pulses, able to move toes, minimal surrounding erythema. Callus to plantar surface of left great toe.   Psychiatric: He has a normal mood and affect. His behavior is normal.  Nursing note and vitals reviewed.   ED CourseMarland Kitchen  ..Incision and Drainage Date/Time: 03/22/2015 12:40 PM Performed by: Glynn Octave Authorized by: Glynn Octave Consent: Verbal consent obtained. Risks and benefits: risks, benefits and alternatives were discussed Consent given by: patient Patient understanding: patient states understanding of the procedure being performed Patient identity confirmed: provided demographic data and verbally with patient Type: bulla Body area: lower extremity Location details: left foot Patient sedated: no Needle gauge: 18 Incision depth: subcutaneous Complexity: simple Drainage: serous Drainage amount: moderate Wound treatment: wound  left open Patient tolerance: Patient tolerated the procedure well with no immediate complications  Bullae to the left foot was drained 2 with needle aspiration. Skin was not debrided. DIAGNOSTIC STUDIES: Oxygen Saturation is 99% on RA, normal by my interpretation.    COORDINATION OF CARE: 10:59 AM Discussed treatment plan with pt at bedside and pt agreed to plan. Will perform I&D procedure and prescribe antibiotics. Pt can follow up on his existing appointment schedule with PCP 3 days from today.    Labs Review Labs Reviewed  BASIC METABOLIC PANEL - Abnormal; Notable for the following:    Sodium 133 (*)    Chloride 100 (*)    Glucose, Bld 107 (*)    All other components within normal limits  CBC WITH DIFFERENTIAL/PLATELET  I have personally reviewed and evaluated these lab results as part of my medical decision-making.   MDM   Final diagnoses:  Blister of toe of left foot, initial encounter   Patient with blisters to his left foot for the past 3 days causing worsening pain. There is no fever or vomiting. There is no surrounding erythema. There has been some clear drainage. He is not a diabetic.  Neurovascularly intact. Xray negative. Labs unremarkable.  Blisters were drained as above. Patient will be given prophylactic antibiotics. Has follow-up with his PCP in 2 days. Instructed to return sooner with spreading redness, worsening pain, fever or vomiting. Referral to podiatry given as well.  I personally performed the services described in this documentation, which was scribed in my presence. The recorded information has been reviewed and is accurate.   Glynn Octave, MD 03/22/15 [phone]

## 2015-03-25 ENCOUNTER — Ambulatory Visit: Payer: 59

## 2015-03-25 ENCOUNTER — Telehealth: Payer: Self-pay

## 2015-03-25 NOTE — Telephone Encounter (Signed)
Patient came to the lab and needs lab work done. Please advise what labs should be ordered.

## 2015-03-25 NOTE — Telephone Encounter (Signed)
i can't find any labs pt is due for

## 2015-04-01 ENCOUNTER — Encounter: Payer: Self-pay | Admitting: Endocrinology

## 2015-04-01 ENCOUNTER — Other Ambulatory Visit: Payer: Self-pay | Admitting: Endocrinology

## 2015-04-01 ENCOUNTER — Ambulatory Visit (INDEPENDENT_AMBULATORY_CARE_PROVIDER_SITE_OTHER): Payer: 59 | Admitting: Endocrinology

## 2015-04-01 VITALS — BP 132/74 | HR 86 | Temp 98.2°F | Ht 75.0 in | Wt 348.0 lb

## 2015-04-01 DIAGNOSIS — Z0189 Encounter for other specified special examinations: Secondary | ICD-10-CM

## 2015-04-01 DIAGNOSIS — Z0000 Encounter for general adult medical examination without abnormal findings: Secondary | ICD-10-CM

## 2015-04-01 DIAGNOSIS — Z23 Encounter for immunization: Secondary | ICD-10-CM | POA: Diagnosis not present

## 2015-04-01 DIAGNOSIS — R7309 Other abnormal glucose: Secondary | ICD-10-CM | POA: Diagnosis not present

## 2015-04-01 DIAGNOSIS — Z125 Encounter for screening for malignant neoplasm of prostate: Secondary | ICD-10-CM | POA: Insufficient documentation

## 2015-04-01 LAB — LIPID PANEL
CHOL/HDL RATIO: 3
Cholesterol: 110 mg/dL (ref 0–200)
HDL: 34.2 mg/dL — AB (ref >39.00)
LDL Cholesterol: 58 mg/dL (ref 0–99)
NonHDL: 76.02
TRIGLYCERIDES: 88 mg/dL (ref 0.0–149.0)
VLDL: 17.6 mg/dL (ref 0.0–40.0)

## 2015-04-01 LAB — HEPATIC FUNCTION PANEL
ALK PHOS: 47 U/L (ref 39–117)
ALT: 16 U/L (ref 0–53)
AST: 15 U/L (ref 0–37)
Albumin: 3.9 g/dL (ref 3.5–5.2)
BILIRUBIN DIRECT: 0.1 mg/dL (ref 0.0–0.3)
TOTAL PROTEIN: 6.9 g/dL (ref 6.0–8.3)
Total Bilirubin: 0.3 mg/dL (ref 0.2–1.2)

## 2015-04-01 LAB — URINALYSIS, ROUTINE W REFLEX MICROSCOPIC
BILIRUBIN URINE: NEGATIVE
Ketones, ur: NEGATIVE
Leukocytes, UA: NEGATIVE
Nitrite: NEGATIVE
PH: 6 (ref 5.0–8.0)
SPECIFIC GRAVITY, URINE: 1.02 (ref 1.000–1.030)
Total Protein, Urine: NEGATIVE
Urine Glucose: NEGATIVE
Urobilinogen, UA: 0.2 (ref 0.0–1.0)
WBC, UA: NONE SEEN (ref 0–?)

## 2015-04-01 LAB — TSH: TSH: 1.53 u[IU]/mL (ref 0.35–4.50)

## 2015-04-01 LAB — PSA: PSA: 0.53 ng/mL (ref 0.10–4.00)

## 2015-04-01 LAB — HEMOGLOBIN A1C: Hgb A1c MFr Bld: 5.7 % (ref 4.6–6.5)

## 2015-04-01 NOTE — Progress Notes (Signed)
Subjective:    Patient ID: [name], male    DOB: [birth year], 59 y.o.   MRN: [id number]  HPI Pt is here for regular wellness examination, and is feeling pretty well in general, and says chronic med probs are stable, except as noted below Past Medical History  Diagnosis Date  . ALLERGIC RHINITIS 03/01/2007  . ASTHMA 03/01/2007  . ERECTILE DYSFUNCTION 03/01/2007  . HYPERGLYCEMIA 03/01/2007  . HYPERLIPIDEMIA 03/01/2007  . HYPERTENSION 03/01/2007  . SHINGLES 03/01/2007  . Hx of echocardiogram     done by [facility]   . Shortness of breath   . Sleep apnea     sees Dr. Shelle Ironlance - sleep study- 06/2011, uses CPAP q night   . Diabetes mellitus without complication (HCC)     , borderline , per pt. , per Dr. Everardo AllEllison  . H/O hiatal hernia   . Arthritis     avascular necrosis - of L shoulder, knees- painful at time s    Past Surgical History  Procedure Laterality Date  . Stress cardiolite  11/01/2001  . Shoulder surgery  2005    Left  . Fx humerus      Left- 2005, required 2 surgeries   . Shoulder hemi-arthroplasty  04/11/2012    Procedure: SHOULDER HEMI-ARTHROPLASTY;  Surgeon: Mable ParisJustin William Chandler, MD;  [facility];  Service: Orthopedics;  Laterality: Left;  LEFT SHOULDER HEMIARTHROPLASTY     Social History   Social History  . Marital Status: Married    Spouse Name: N/A  . Number of Children: 2  . 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 Smoking status: Current Every Day Smoker -- 0.50 packs/day for 31 years    Types: Cigarettes  . Smokeless tobacco: Former NeurosurgeonUser     Comment: smoking less than 1/2 a day (10/08/14)  . Alcohol Use: No  . Drug Use: No  . Sexual Activity: Not on file   Other Topics Concern  . Not on file   Social History Narrative    Current Outpatient Prescriptions on File Prior to Visit  Medication Sig Dispense Refill  . albuterol (PROVENTIL HFA;VENTOLIN HFA) 108 (90 BASE) MCG/ACT inhaler  Inhale 1-2 puffs into the lungs every 6 (six) hours as needed for wheezing or shortness of breath. 1 Inhaler 0  . cephALEXin (KEFLEX) 500 MG capsule Take 1 capsule (500 mg total) by mouth 4 (four) times daily. 40 capsule 0  . Chlorpheniramine-DM (CORICIDIN HBP COUGH/COLD PO) Take 2 capsules by mouth every 6 (six) hours as needed (cough).    . DULoxetine (CYMBALTA) 30 MG capsule Take 1 capsule (30 mg total) by mouth daily. 30 capsule 11  . ibuprofen (ADVIL,MOTRIN) 200 MG tablet Take 400 mg by mouth every 6 (six) hours as needed.    Marland Kitchen. losartan-hydrochlorothiazide (HYZAAR) 100-25 MG per tablet TAKE 1 TABLET BY MOUTH EVERY DAY WITH SUPPER 30 tablet 0  . lovastatin (MEVACOR) 40 MG tablet Take 1 tablet (40 mg total) by mouth at bedtime. 30 tablet 6  . traMADol (ULTRAM) 50 MG tablet Take 1 tablet (50 mg total) by mouth every 6 (six) hours as needed. for pain 50 tablet 0   No current facility-administered medications on file prior to visit.    No Known Allergies  Family History  Problem Relation Age of Onset  . Cancer Neg Hx     BP 132/74 mmHg  Pulse 86  Temp(Src) 98.2 F (  36.8 C) (Oral)  Ht  (1.905 m)  Wt 348 lb (157.852 kg)  BMI 43.50 kg/m2  SpO2 96%  Review of Systems  Constitutional: Negative for fever and unexpected weight change.  HENT: Negative for hearing loss.   Eyes: Negative for visual disturbance.  Respiratory: Negative for shortness of breath.   Cardiovascular: Negative for chest pain.  Gastrointestinal: Negative for anal bleeding.  Endocrine: Negative for cold intolerance.  Genitourinary: Negative for hematuria and difficulty urinating.  Musculoskeletal: Positive for back pain.  Skin:       Left foot infection is much better  Allergic/Immunologic: Negative for environmental allergies.  Neurological: Negative for numbness.  Hematological: Does not bruise/bleed easily.  Psychiatric/Behavioral:       Anxiety is much better       Objective:   Physical Exam VS:  see vs page GEN: no distress HEAD: head: no deformity eyes: no periorbital swelling, no proptosis external nose and ears are normal mouth: no lesion seen NECK: supple, thyroid is not enlarged CHEST WALL: no deformity LUNGS: clear to auscultation BREASTS:  No gynecomastia CV: reg rate and rhythm, no murmur ABD: abdomen is soft, nontender.  no hepatosplenomegaly.  not distended.  no hernia RECTAL: normal external and internal exam.  heme neg. PROSTATE:  Normal size.  No nodule MUSCULOSKELETAL: muscle bulk and strength are grossly normal.  no obvious joint swelling.  gait is normal and steady EXTEMITIES: no deformity.  no openulcer on the feet. feet are of normal color and temp.  1+ bilat edema.  On the left foot (centered at the lateral aspect of the great toe MTP), there is a 3x6 cm white bulla, but no erythema/tenderness/warmth/drainage. PULSES: dorsalis pedis intact bilat.  no carotid bruit NEURO:  cn 2-12 grossly intact.   readily moves all 4's.  sensation is intact to touch on the feet SKIN:  Normal texture and temperature.  No rash or suspicious lesion is visible.   NODES:  None palpable at the neck PSYCH: alert, well-oriented.  Does not appear anxious nor depressed.      Assessment & Plan:  Wellness visit today, with problems stable, except as noted.  Patient is advised the following: Patient Instructions  blood tests are requested for you today.  We'll let you know about the results. please consider these measures for your health:  minimize alcohol.  do not use tobacco products.  have a colonoscopy at least every 10 years from age 10.  keep firearms safely stored.  always use seat belts.  have working smoke alarms in your home.  see an eye doctor and dentist regularly.  never drive under the influence of alcohol or drugs (including prescription drugs).  those with fair skin should take precautions against the sun. Please return in 1 year.

## 2015-04-01 NOTE — Progress Notes (Signed)
we discussed code status.  pt requests full code, but would not want to be started or maintained on artificial life-support measures if there was not a reasonable chance of recovery 

## 2015-04-01 NOTE — Patient Instructions (Signed)
blood tests are requested for you today.  We'll let you know about the results.  please consider these measures for your health:  minimize alcohol.  do not use tobacco products.  have a colonoscopy at least every 10 years from age 58.  keep firearms safely stored.  always use seat belts.  have working smoke alarms in your home.  see an eye doctor and dentist regularly.  never drive under the influence of alcohol or drugs (including prescription drugs).  those with fair skin should take precautions against the sun. Please return in 1 year.   

## 2015-04-02 LAB — HEPATITIS C ANTIBODY: HCV Ab: NEGATIVE

## 2015-04-15 ENCOUNTER — Other Ambulatory Visit: Payer: Self-pay | Admitting: Endocrinology

## 2015-04-19 ENCOUNTER — Other Ambulatory Visit: Payer: Self-pay | Admitting: Endocrinology

## 2015-04-22 ENCOUNTER — Other Ambulatory Visit: Payer: Self-pay | Admitting: Endocrinology

## 2015-04-22 NOTE — Telephone Encounter (Signed)
Pt needs refill on tramadol [facility]

## 2015-04-22 NOTE — Telephone Encounter (Signed)
Patient would like a refill of Tramadol and lovastatin.

## 2015-04-30 ENCOUNTER — Other Ambulatory Visit: Payer: Self-pay | Admitting: Endocrinology

## 2015-05-15 ENCOUNTER — Other Ambulatory Visit: Payer: Self-pay | Admitting: Endocrinology

## 2015-05-22 ENCOUNTER — Other Ambulatory Visit: Payer: Self-pay | Admitting: Endocrinology

## 2015-05-23 ENCOUNTER — Other Ambulatory Visit: Payer: Self-pay | Admitting: Endocrinology

## 2015-06-03 ENCOUNTER — Other Ambulatory Visit: Payer: Self-pay | Admitting: Endocrinology

## 2015-06-11 ENCOUNTER — Emergency Department (HOSPITAL_COMMUNITY)
Admission: EM | Admit: 2015-06-11 | Discharge: 2015-06-11 | Disposition: A | Discharge status: Home / Self Care | Payer: Self-pay | Attending: Emergency Medicine | Admitting: Emergency Medicine

## 2015-06-11 ENCOUNTER — Encounter (HOSPITAL_COMMUNITY): Payer: Self-pay | Admitting: Emergency Medicine

## 2015-06-11 ENCOUNTER — Emergency Department (HOSPITAL_COMMUNITY): Payer: Self-pay

## 2015-06-11 DIAGNOSIS — F17210 Nicotine dependence, cigarettes, uncomplicated: Secondary | ICD-10-CM | POA: Insufficient documentation

## 2015-06-11 DIAGNOSIS — Z9981 Dependence on supplemental oxygen: Secondary | ICD-10-CM | POA: Insufficient documentation

## 2015-06-11 DIAGNOSIS — Z87438 Personal history of other diseases of male genital organs: Secondary | ICD-10-CM | POA: Insufficient documentation

## 2015-06-11 DIAGNOSIS — J45901 Unspecified asthma with (acute) exacerbation: Secondary | ICD-10-CM | POA: Insufficient documentation

## 2015-06-11 DIAGNOSIS — E785 Hyperlipidemia, unspecified: Secondary | ICD-10-CM | POA: Insufficient documentation

## 2015-06-11 DIAGNOSIS — Z8719 Personal history of other diseases of the digestive system: Secondary | ICD-10-CM | POA: Insufficient documentation

## 2015-06-11 DIAGNOSIS — Z792 Long term (current) use of antibiotics: Secondary | ICD-10-CM | POA: Insufficient documentation

## 2015-06-11 DIAGNOSIS — I10 Essential (primary) hypertension: Secondary | ICD-10-CM | POA: Insufficient documentation

## 2015-06-11 DIAGNOSIS — Z79899 Other long term (current) drug therapy: Secondary | ICD-10-CM | POA: Insufficient documentation

## 2015-06-11 DIAGNOSIS — G4730 Sleep apnea, unspecified: Secondary | ICD-10-CM | POA: Insufficient documentation

## 2015-06-11 DIAGNOSIS — E119 Type 2 diabetes mellitus without complications: Secondary | ICD-10-CM | POA: Insufficient documentation

## 2015-06-11 DIAGNOSIS — Z8619 Personal history of other infectious and parasitic diseases: Secondary | ICD-10-CM | POA: Insufficient documentation

## 2015-06-11 DIAGNOSIS — J40 Bronchitis, not specified as acute or chronic: Secondary | ICD-10-CM

## 2015-06-11 DIAGNOSIS — M1990 Unspecified osteoarthritis, unspecified site: Secondary | ICD-10-CM | POA: Insufficient documentation

## 2015-06-11 MED ORDER — IPRATROPIUM-ALBUTEROL 0.5-2.5 (3) MG/3ML IN SOLN
3.0000 mL | Freq: Once | RESPIRATORY_TRACT | Status: AC
  Administered 2015-06-11: 3 mL via RESPIRATORY_TRACT
  Filled 2015-06-11: qty 3

## 2015-06-11 MED ORDER — PREDNISONE 10 MG PO TABS: ORAL_TABLET | ORAL | Status: DC

## 2015-06-11 MED ORDER — PREDNISONE 50 MG PO TABS
60.0000 mg | ORAL_TABLET | Freq: Every day | ORAL | Status: DC
  Administered 2015-06-11: 60 mg via ORAL
  Filled 2015-06-11: qty 1

## 2015-06-11 MED ORDER — AZITHROMYCIN 250 MG PO TABS: 250.0000 mg | ORAL_TABLET | Freq: Every day | ORAL | Status: DC

## 2015-06-11 NOTE — ED Notes (Signed)
Haivng cough for 4-5 days.  Secretions yellow and green.

## 2015-06-11 NOTE — Discharge Instructions (Signed)

## 2015-06-11 NOTE — ED Provider Notes (Signed)
CSN: [id number]     Arrival date & time 06/11/15  1307 History   First MD Initiated Contact with Patient 06/11/15 1340     Chief Complaint  Patient presents with  . Cough     (Consider location/radiation/quality/duration/timing/severity/associated sxs/prior Treatment) Patient is a 60 y.o. male presenting with cough. The history is provided by the patient. No language interpreter was used.  Cough Cough characteristics:  Non-productive Severity:  Moderate Onset quality:  Gradual Timing:  Constant Progression:  Worsening Chronicity:  New Smoker: no   Context: not sick contacts   Relieved by:  Nothing Worsened by:  Nothing tried Ineffective treatments:  None tried Associated symptoms: rhinorrhea, shortness of breath and sinus congestion     Past Medical History  Diagnosis Date  . ALLERGIC RHINITIS 03/01/2007  . ASTHMA 03/01/2007  . ERECTILE DYSFUNCTION 03/01/2007  . HYPERGLYCEMIA 03/01/2007  . HYPERLIPIDEMIA 03/01/2007  . HYPERTENSION 03/01/2007  . SHINGLES 03/01/2007  . Hx of echocardiogram     done by [facility]   . Shortness of breath   . Sleep apnea     sees Dr. Shelle Iron - sleep study- 06/2011, uses CPAP q night   . Diabetes mellitus without complication (HCC)     , borderline , per pt. , per Dr. [name]  . H/O hiatal hernia   . Arthritis     avascular necrosis - of L shoulder, knees- painful at time s   Past Surgical History  Procedure Laterality Date  . Stress cardiolite  11/01/2001  . Shoulder surgery  2005    Left  . Fx humerus      Left- 2005, required 2 surgeries   . Shoulder hemi-arthroplasty  04/11/2012    Procedure: SHOULDER HEMI-ARTHROPLASTY;  Surgeon: [name], MD;  Location: [facility] OR;  Service: Orthopedics;  Laterality: Left;  LEFT SHOULDER HEMIARTHROPLASTY    Family History  Problem Relation Age of Onset  . Cancer Neg Hx    Social History  Substance Use Topics  . Smoking status: Current Every Day Smoker -- 0.50 packs/day for 31 years   Types: Cigarettes  . Smokeless tobacco: Former Neurosurgeon     Comment: smoking less than 1/2 a day (10/08/14)  . Alcohol Use: No    Review of Systems  HENT: Positive for rhinorrhea.   Respiratory: Positive for cough and shortness of breath.   All other systems reviewed and are negative.     Allergies  Review of patient's allergies indicates no known allergies.  Home Medications   Prior to Admission medications   Medication Sig Start Date End Date Taking? Authorizing Provider  albuterol (PROVENTIL HFA;VENTOLIN HFA) 108 (90 BASE) MCG/ACT inhaler Inhale 1-2 puffs into the lungs every 6 (six) hours as needed for wheezing or shortness of breath. 09/19/14   Zadie Rhine, MD  ALPRAZolam Prudy Feeler) 0.25 MG tablet TAKE ONE TABLET BY MOUTH THREE TIMES DAILY AS NEEDED FOR ANXIETY 06/03/15   Romero Belling, MD  azithromycin (ZITHROMAX) 250 MG tablet Take 1 tablet (250 mg total) by mouth daily. Take first 2 tablets together, then 1 every day until finished. 06/11/15   Elson Areas, PA-C  cephALEXin (KEFLEX) 500 MG capsule Take 1 capsule (500 mg total) by mouth 4 (four) times daily. 03/22/15   Glynn Octave, MD  Chlorpheniramine-DM (CORICIDIN HBP COUGH/COLD PO) Take 2 capsules by mouth every 6 (six) hours as needed (cough).    Historical Provider, MD  DULoxetine (CYMBALTA) 30 MG capsule Take 1 capsule (30 mg total) by mouth daily.  11/22/14   Romero Belling, MD  ibuprofen (ADVIL,MOTRIN) 200 MG tablet Take 400 mg by mouth every 6 (six) hours as needed.    Historical Provider, MD  lisinopril-hydrochlorothiazide (PRINZIDE,ZESTORETIC) 20-12.5 MG tablet TAKE TWO TABLETS BY MOUTH ONCE DAILY 05/15/15   Romero Belling, MD  losartan-hydrochlorothiazide (HYZAAR) 100-25 MG per tablet TAKE 1 TABLET BY MOUTH EVERY DAY WITH SUPPER 05/17/14   Romero Belling, MD  lovastatin (MEVACOR) 40 MG tablet TAKE ONE TABLET BY MOUTH ONCE DAILY AT BEDTIME 05/22/15   Romero Belling, MD  predniSONE (DELTASONE) 10 MG tablet 5,4,3,2,1 taper 06/11/15    Elson Areas, PA-C  traMADol (ULTRAM) 50 MG tablet TAKE ONE TABLET BY MOUTH EVERY 6 HOURS AS NEEDED FOR PAIN 05/23/15   Reather Littler, MD   BP 101/58 mmHg  Pulse 76  Temp(Src) 98.1 F (36.7 C) (Oral)  Resp 18  Ht  (1.93 m)  Wt 147.419 kg  BMI 39.58 kg/m2  SpO2 97% Physical Exam  Constitutional: He appears well-developed and well-nourished.  HENT:  Head: Normocephalic.  Right Ear: External ear normal.  Nose: Nose normal.  Mouth/Throat: Oropharynx is clear and moist.  Eyes: Conjunctivae are normal. Pupils are equal, round, and reactive to light.  Neck: Normal range of motion. Neck supple.  Cardiovascular: Normal rate and normal heart sounds.   Pulmonary/Chest: He has wheezes. He has no rales. He exhibits no tenderness.  Rhonchi all lobes  Abdominal: Soft.  Musculoskeletal: Normal range of motion.  Neurological: He is alert.  Skin: Skin is warm.  Nursing note and vitals reviewed.   ED Course  Procedures (including critical care time) Labs Review Labs Reviewed - No data to display  Imaging Review Dg Chest 2 View  06/11/2015  CLINICAL DATA:  Cough and congestion ; fever EXAM: CHEST  2 VIEW COMPARISON:  Oct 13, 2014 FINDINGS: There is diffuse interstitial prominence without frank edema or consolidation. There is chronic blunting of the left costophrenic angle. Heart is normal in size and contour. Pulmonary vascularity is normal. No adenopathy. There is a total shoulder replacement on the left. There is calcification in the left carotid artery. IMPRESSION: Stable chronic interstitial thickening. No frank edema or consolidation. Scarring lateral left base. Calcification present in left carotid artery. No appreciable change from prior study. Electronically Signed   By: Bretta Bang III M.D.   On: 06/11/2015 13:33   I have personally reviewed and evaluated these images and lab results as part of my medical decision-making.   EKG Interpretation None      MDM Pt feels  better after neb treatment   Final diagnoses:  Bronchitis    Continue albuterol Zithromax Prednisone taper  An After Visit Summary was printed and given to the patient.  Lonia Skinner [place], PA-C 06/11/15 1637  Raeford Razor, MD 06/17/15 [phone]

## 2015-06-20 ENCOUNTER — Other Ambulatory Visit: Payer: Self-pay | Admitting: Endocrinology

## 2015-06-20 MED ORDER — LOVASTATIN 40 MG PO TABS: 40.0000 mg | ORAL_TABLET | Freq: Every day | ORAL | Status: DC

## 2015-06-21 ENCOUNTER — Telehealth: Payer: Self-pay

## 2015-06-21 ENCOUNTER — Telehealth: Payer: Self-pay | Admitting: Endocrinology

## 2015-06-21 MED ORDER — ALPRAZOLAM 0.25 MG PO TABS
0.2500 mg | ORAL_TABLET | Freq: Three times a day (TID) | ORAL | Status: DC | PRN
Start: 2015-06-21 — End: 2015-08-11

## 2015-06-21 NOTE — Telephone Encounter (Signed)
i printed 

## 2015-06-21 NOTE — Telephone Encounter (Signed)
Rx sent and confirmed by [facility]. Pt will pick medication up later today.

## 2015-06-21 NOTE — Telephone Encounter (Signed)
[facility] faxed to [facility].

## 2015-06-21 NOTE — Telephone Encounter (Signed)
Pt is requesting a refill on xanax 0.25 mg, please advise if ok to refill. Thanks!

## 2015-06-21 NOTE — Telephone Encounter (Signed)
Patient is calling about his prescription xanax, pharmacy havent received it yet , please advise

## 2015-06-29 ENCOUNTER — Emergency Department (HOSPITAL_COMMUNITY)
Admission: EM | Admit: 2015-06-29 | Discharge: 2015-06-29 | Disposition: A | Discharge status: Home / Self Care | Payer: Self-pay | Attending: Emergency Medicine | Admitting: Emergency Medicine

## 2015-06-29 ENCOUNTER — Encounter (HOSPITAL_COMMUNITY): Payer: Self-pay

## 2015-06-29 ENCOUNTER — Emergency Department (HOSPITAL_COMMUNITY): Payer: Self-pay

## 2015-06-29 DIAGNOSIS — F17210 Nicotine dependence, cigarettes, uncomplicated: Secondary | ICD-10-CM | POA: Insufficient documentation

## 2015-06-29 DIAGNOSIS — E785 Hyperlipidemia, unspecified: Secondary | ICD-10-CM | POA: Insufficient documentation

## 2015-06-29 DIAGNOSIS — Z87438 Personal history of other diseases of male genital organs: Secondary | ICD-10-CM | POA: Insufficient documentation

## 2015-06-29 DIAGNOSIS — Y9289 Other specified places as the place of occurrence of the external cause: Secondary | ICD-10-CM | POA: Insufficient documentation

## 2015-06-29 DIAGNOSIS — I10 Essential (primary) hypertension: Secondary | ICD-10-CM | POA: Insufficient documentation

## 2015-06-29 DIAGNOSIS — Z9981 Dependence on supplemental oxygen: Secondary | ICD-10-CM | POA: Insufficient documentation

## 2015-06-29 DIAGNOSIS — G4730 Sleep apnea, unspecified: Secondary | ICD-10-CM | POA: Insufficient documentation

## 2015-06-29 DIAGNOSIS — Y9389 Activity, other specified: Secondary | ICD-10-CM | POA: Insufficient documentation

## 2015-06-29 DIAGNOSIS — Z792 Long term (current) use of antibiotics: Secondary | ICD-10-CM | POA: Insufficient documentation

## 2015-06-29 DIAGNOSIS — Z79899 Other long term (current) drug therapy: Secondary | ICD-10-CM | POA: Insufficient documentation

## 2015-06-29 DIAGNOSIS — Z8719 Personal history of other diseases of the digestive system: Secondary | ICD-10-CM | POA: Insufficient documentation

## 2015-06-29 DIAGNOSIS — Z8619 Personal history of other infectious and parasitic diseases: Secondary | ICD-10-CM | POA: Insufficient documentation

## 2015-06-29 DIAGNOSIS — X58XXXA Exposure to other specified factors, initial encounter: Secondary | ICD-10-CM | POA: Insufficient documentation

## 2015-06-29 DIAGNOSIS — S2232XA Fracture of one rib, left side, initial encounter for closed fracture: Secondary | ICD-10-CM | POA: Insufficient documentation

## 2015-06-29 DIAGNOSIS — M1990 Unspecified osteoarthritis, unspecified site: Secondary | ICD-10-CM | POA: Insufficient documentation

## 2015-06-29 DIAGNOSIS — Y998 Other external cause status: Secondary | ICD-10-CM | POA: Insufficient documentation

## 2015-06-29 DIAGNOSIS — J449 Chronic obstructive pulmonary disease, unspecified: Secondary | ICD-10-CM | POA: Insufficient documentation

## 2015-06-29 MED ORDER — OXYCODONE-ACETAMINOPHEN 5-325 MG PO TABS: 2.0000 | ORAL_TABLET | ORAL | Status: DC | PRN

## 2015-06-29 MED ORDER — OXYCODONE-ACETAMINOPHEN 5-325 MG PO TABS: 1.0000 | ORAL_TABLET | ORAL | Status: DC | PRN

## 2015-06-29 NOTE — ED Provider Notes (Signed)
CSN: [id number]     Arrival date & time 06/29/15  4098 History   First MD Initiated Contact with Patient 06/29/15 0451     Chief Complaint  Patient presents with  . Flank Pain     (Consider location/radiation/quality/duration/timing/severity/associated sxs/prior Treatment) HPI Comments: Patient presents to the ER for evaluation of left rib pain. Patient reports that he coughed when he was pushing to have a bowel movement and felt a pop in his left lateral ribs. Patient reports a sharp stabbing pain ever since. Pain worsens with movement.  Patient is a 60 y.o. male presenting with flank pain.  Flank Pain    Past Medical History  Diagnosis Date  . ALLERGIC RHINITIS 03/01/2007  . ASTHMA 03/01/2007  . ERECTILE DYSFUNCTION 03/01/2007  . HYPERGLYCEMIA 03/01/2007  . HYPERLIPIDEMIA 03/01/2007  . HYPERTENSION 03/01/2007  . SHINGLES 03/01/2007  . Hx of echocardiogram     done by [place]   . Shortness of breath   . Sleep apnea     sees Dr. Shelle Iron - sleep study- 06/2011, uses CPAP q night   . Diabetes mellitus without complication (HCC)     , borderline , per pt. , per Dr. [name]  . H/O hiatal hernia   . Arthritis     avascular necrosis - of L shoulder, knees- painful at time s   Past Surgical History  Procedure Laterality Date  . Stress cardiolite  11/01/2001  . Shoulder surgery  2005    Left  . Fx humerus      Left- 2005, required 2 surgeries   . Shoulder hemi-arthroplasty  04/11/2012    Procedure: SHOULDER HEMI-ARTHROPLASTY;  Surgeon: [name], MD;  Location: [facility] OR;  Service: Orthopedics;  Laterality: Left;  LEFT SHOULDER HEMIARTHROPLASTY    Family History  Problem Relation Age of Onset  . Cancer Neg Hx    Social History  Substance Use Topics  . Smoking status: Current Every Day Smoker -- 0.50 packs/day for 31 years    Types: Cigarettes  . Smokeless tobacco: Former Neurosurgeon     Comment: smoking less than 1/2 a day (10/08/14)  . Alcohol Use: No    Review of Systems   Genitourinary: Positive for flank pain.  All other systems reviewed and are negative.     Allergies  Review of patient's allergies indicates no known allergies.  Home Medications   Prior to Admission medications   Medication Sig Start Date End Date Taking? Authorizing Provider  albuterol (PROVENTIL HFA;VENTOLIN HFA) 108 (90 BASE) MCG/ACT inhaler Inhale 1-2 puffs into the lungs every 6 (six) hours as needed for wheezing or shortness of breath. 09/19/14   Zadie Rhine, MD  ALPRAZolam Prudy Feeler) 0.25 MG tablet Take 1 tablet (0.25 mg total) by mouth 3 (three) times daily as needed. for anxiety 06/21/15   Romero Belling, MD  azithromycin (ZITHROMAX) 250 MG tablet Take 1 tablet (250 mg total) by mouth daily. Take first 2 tablets together, then 1 every day until finished. 06/11/15   Elson Areas, PA-C  cephALEXin (KEFLEX) 500 MG capsule Take 1 capsule (500 mg total) by mouth 4 (four) times daily. 03/22/15   Glynn Octave, MD  Chlorpheniramine-DM (CORICIDIN HBP COUGH/COLD PO) Take 2 capsules by mouth every 6 (six) hours as needed (cough).    Historical Provider, MD  DULoxetine (CYMBALTA) 30 MG capsule Take 1 capsule (30 mg total) by mouth daily. 11/22/14   Romero Belling, MD  ibuprofen (ADVIL,MOTRIN) 200 MG tablet Take 400 mg by mouth every  6 (six) hours as needed.    Historical Provider, MD  lisinopril-hydrochlorothiazide (PRINZIDE,ZESTORETIC) 20-12.5 MG tablet TAKE TWO TABLETS BY MOUTH ONCE DAILY 05/15/15   Romero Belling, MD  losartan-hydrochlorothiazide (HYZAAR) 100-25 MG per tablet TAKE 1 TABLET BY MOUTH EVERY DAY WITH SUPPER 05/17/14   Romero Belling, MD  lovastatin (MEVACOR) 40 MG tablet Take 1 tablet (40 mg total) by mouth daily with breakfast. 06/20/15   Romero Belling, MD  predniSONE (DELTASONE) 10 MG tablet 5,4,3,2,1 taper 06/11/15   Elson Areas, PA-C  traMADol (ULTRAM) 50 MG tablet TAKE ONE TABLET BY MOUTH EVERY 6 HOURS AS NEEDED FOR PAIN 06/20/15   Romero Belling, MD   BP 125/69 mmHg  Pulse 81   Temp(Src) 97.8 F (36.6 C) (Oral)  Resp 22  Ht  (1.93 m)  Wt 325 lb (147.419 kg)  BMI 39.58 kg/m2  SpO2 100% Physical Exam  Constitutional: He is oriented to person, place, and time. He appears well-developed and well-nourished. No distress.  HENT:  Head: Normocephalic and atraumatic.  Right Ear: Hearing normal.  Left Ear: Hearing normal.  Nose: Nose normal.  Mouth/Throat: Oropharynx is clear and moist and mucous membranes are normal.  Eyes: Conjunctivae and EOM are normal. Pupils are equal, round, and reactive to light.  Neck: Normal range of motion. Neck supple.  Cardiovascular: Regular rhythm, S1 normal and S2 normal.  Exam reveals no gallop and no friction rub.   No murmur heard. Pulmonary/Chest: Effort normal and breath sounds normal. No respiratory distress. He exhibits no tenderness and no crepitus.    Abdominal: Soft. Normal appearance and bowel sounds are normal. There is no hepatosplenomegaly. There is no tenderness. There is no rebound, no guarding, no tenderness at McBurney's point and negative Murphy's sign. No hernia.  Musculoskeletal: Normal range of motion.  Neurological: He is alert and oriented to person, place, and time. He has normal strength. No cranial nerve deficit or sensory deficit. Coordination normal. GCS eye subscore is 4. GCS verbal subscore is 5. GCS motor subscore is 6.  Skin: Skin is warm, dry and intact. No rash noted. No cyanosis.  Psychiatric: He has a normal mood and affect. His speech is normal and behavior is normal. Thought content normal.  Nursing note and vitals reviewed.   ED Course  Procedures (including critical care time) Labs Review Labs Reviewed - No data to display  Imaging Review Dg Ribs Unilateral W/chest Left  06/29/2015  CLINICAL DATA:  Cough with pop on the left. Pain. Initial encounter. EXAM: LEFT RIBS AND CHEST - 3+ VIEW COMPARISON:  06/11/2015 FINDINGS: Chronic hyperinflation and interstitial coarsening. There is no  edema, consolidation, effusion, or pneumothorax. Borderline cardiomegaly which is stable. Nondisplaced lateral left ninth rib fracture with acute appearance. IMPRESSION: 1. Nondisplaced left ninth rib fracture. 2. No acute intrathoracic finding. 3. COPD. Electronically Signed   By: Marnee Spring M.D.   On: 06/29/2015 05:33   I have personally reviewed and evaluated these images and lab results as part of my medical decision-making.   EKG Interpretation None      MDM   Final diagnoses:  None  rib fracture  Patient presents to the emergency department with complaints of sudden onset left rib pain. Patient reports that he was bearing down to have a bowel movement when he coughed and he felt a pop in his rib cage. X-ray does confirm ninth rib fracture. Patient does not have any lung pathology noted on the x-ray. He does, however, have a history  of COPD. Patient counseled to continue using his albuterol, was given incentive spirometry. Will be provided analgesia and is to follow-up with his primary doctor this week for recheck. Return for fever or productive cough.    Gilda Crease, MD 06/29/15 [phone]

## 2015-06-29 NOTE — Discharge Instructions (Signed)

## 2015-06-29 NOTE — ED Notes (Signed)
Pt states he was on the commode trying to have a bm when he was pushing hard and then coughed hard, felt a sharp pain to his left side "like something blew out"   Pt states pain has continued.

## 2015-07-01 MED FILL — Hydrocodone-Acetaminophen Tab 5-325 MG: ORAL | Qty: 6 | Status: AC

## 2015-07-03 ENCOUNTER — Telehealth: Payer: Self-pay | Admitting: Endocrinology

## 2015-07-03 MED ORDER — LISINOPRIL-HYDROCHLOROTHIAZIDE 20-12.5 MG PO TABS: 2.0000 | ORAL_TABLET | Freq: Every day | ORAL | Status: DC

## 2015-07-03 NOTE — Telephone Encounter (Signed)
Rx has been submitted per pt's request.  

## 2015-07-03 NOTE — Telephone Encounter (Signed)
Pt called and said [facility] faxed [place] a a refill request for his Blood Pressure Medication and he has been out of it for 2 days and needs it sent back in ASAP

## 2015-07-22 ENCOUNTER — Other Ambulatory Visit: Payer: Self-pay | Admitting: Endocrinology

## 2015-08-11 ENCOUNTER — Other Ambulatory Visit: Payer: Self-pay | Admitting: Endocrinology

## 2015-08-12 ENCOUNTER — Telehealth: Payer: Self-pay | Admitting: Endocrinology

## 2015-08-12 NOTE — Telephone Encounter (Signed)
Pt requests call back from you °

## 2015-08-12 NOTE — Telephone Encounter (Signed)
I contacted the pt. He requested a refill on his xanax. Pt advised rx has been printed and faxed to the pt's pharmacy.

## 2015-08-22 ENCOUNTER — Other Ambulatory Visit: Payer: Self-pay | Admitting: Endocrinology

## 2015-08-23 NOTE — Telephone Encounter (Signed)
i have printed refill x 1 Please come in for ov, to address this.

## 2015-08-31 ENCOUNTER — Other Ambulatory Visit: Payer: Self-pay | Admitting: Endocrinology

## 2015-09-02 NOTE — Telephone Encounter (Signed)
Carollee HerterShannon, Could you check with Dr. Lucianne MussKumar and see if he would sign during Dr. George HughEllison's on this medication.

## 2015-09-02 NOTE — Telephone Encounter (Signed)
Please advise if ok to refill. 

## 2015-09-03 ENCOUNTER — Telehealth: Payer: Self-pay | Admitting: Endocrinology

## 2015-09-03 ENCOUNTER — Other Ambulatory Visit: Payer: Self-pay

## 2015-09-03 NOTE — Telephone Encounter (Signed)
Xanax needs to be refilled

## 2015-09-03 NOTE — Telephone Encounter (Signed)
He got this prescription 3 weeks ago, need to know why he is taking so many

## 2015-09-03 NOTE — Telephone Encounter (Signed)
Pt was advised an appointment is needed before further refills can be given. Pt scheduled for 09/10/2015.

## 2015-09-04 NOTE — Telephone Encounter (Signed)
Per. Dr. Everardo AllEllison pt needs an appointment for further refills. Rx has been cancelled and pt has been notified. Pt scheduled for a follow up with Dr. Everardo AllEllison.

## 2015-09-10 ENCOUNTER — Ambulatory Visit: Payer: Self-pay | Admitting: Endocrinology

## 2015-09-22 ENCOUNTER — Other Ambulatory Visit: Payer: Self-pay | Admitting: Endocrinology

## 2015-09-23 ENCOUNTER — Other Ambulatory Visit: Payer: Self-pay | Admitting: Endocrinology

## 2015-09-23 MED ORDER — TRAMADOL HCL 50 MG PO TABS: 50.0000 mg | ORAL_TABLET | Freq: Four times a day (QID) | ORAL | Status: DC | PRN

## 2015-09-23 MED ORDER — ALPRAZOLAM 0.25 MG PO TABS: 0.2500 mg | ORAL_TABLET | Freq: Three times a day (TID) | ORAL | Status: DC | PRN

## 2015-09-23 NOTE — Telephone Encounter (Signed)
i printed 

## 2015-09-23 NOTE — Telephone Encounter (Signed)
Patient was advised last month that his tramadol and xanax would not be refilled until he was seen, he did make an appointment for 05/10 @ 9:45, he wants to know if he has to wait until then to get those refilled.  Please advise, he's waiting to hear back.

## 2015-09-23 NOTE — Telephone Encounter (Signed)
PT requests call back from nurse.

## 2015-09-23 NOTE — Telephone Encounter (Addendum)
Pt notified . Prescriptions placed at front desk.

## 2015-10-02 ENCOUNTER — Ambulatory Visit: Payer: Self-pay | Admitting: Endocrinology

## 2015-10-14 ENCOUNTER — Other Ambulatory Visit: Payer: Self-pay | Admitting: Endocrinology

## 2015-10-14 NOTE — Telephone Encounter (Signed)
i printed 

## 2015-10-22 ENCOUNTER — Other Ambulatory Visit: Payer: Self-pay | Admitting: Endocrinology

## 2015-11-03 ENCOUNTER — Other Ambulatory Visit: Payer: Self-pay | Admitting: Endocrinology

## 2015-11-04 ENCOUNTER — Telehealth: Payer: Self-pay | Admitting: Endocrinology

## 2015-11-04 NOTE — Telephone Encounter (Signed)
please call patient: i printed refill today Ov needed before any future refills.

## 2015-11-05 NOTE — Telephone Encounter (Signed)
Appointment letter mailed to the pt.  

## 2015-11-19 ENCOUNTER — Telehealth: Payer: Self-pay | Admitting: Endocrinology

## 2015-11-19 ENCOUNTER — Other Ambulatory Visit: Payer: Self-pay | Admitting: Endocrinology

## 2015-11-19 NOTE — Telephone Encounter (Signed)
PT calling to find out if we will give him enough Xanax to hold him over until his appt, he has enough until Thursday.  Requests call back from [place].

## 2015-11-20 MED ORDER — TRAMADOL HCL 50 MG PO TABS: 50.0000 mg | ORAL_TABLET | Freq: Four times a day (QID) | ORAL | Status: DC | PRN

## 2015-11-20 NOTE — Telephone Encounter (Signed)
See below. Pt is also in need of his tramadol prescription. Would you be ok with refilling during Dr. George HughEllison's absence? Thanks!

## 2015-11-20 NOTE — Telephone Encounter (Signed)
Left message to return call 

## 2015-11-20 NOTE — Telephone Encounter (Signed)
Okay to refill tramadol.

## 2015-11-20 NOTE — Telephone Encounter (Signed)
Can we refill the xanax?

## 2015-11-20 NOTE — Telephone Encounter (Signed)
No, 50 tablets given on 6/12

## 2015-11-20 NOTE — Telephone Encounter (Signed)
Pt is requesting a refill. Last filled on 10/23/15 #50, pt had a wellness exam on 04/01/15. Ok to refill?

## 2015-11-21 NOTE — Telephone Encounter (Signed)
Ov needed before any future refills.

## 2015-11-25 ENCOUNTER — Telehealth: Payer: Self-pay

## 2015-11-25 ENCOUNTER — Other Ambulatory Visit: Payer: Self-pay | Admitting: Endocrinology

## 2015-11-25 MED ORDER — ALPRAZOLAM 0.25 MG PO TABS: 0.2500 mg | ORAL_TABLET | Freq: Three times a day (TID) | ORAL | Status: DC | PRN

## 2015-11-25 NOTE — Telephone Encounter (Signed)
i printed 

## 2015-11-25 NOTE — Telephone Encounter (Signed)
Rx submitted and pt notified.  

## 2015-11-25 NOTE — Telephone Encounter (Signed)
Pt called requesting a refill on his Xanax. Can we refill? Thanks!

## 2015-12-03 ENCOUNTER — Encounter: Payer: Self-pay | Admitting: Endocrinology

## 2015-12-03 ENCOUNTER — Ambulatory Visit (INDEPENDENT_AMBULATORY_CARE_PROVIDER_SITE_OTHER): Payer: Self-pay | Admitting: Endocrinology

## 2015-12-03 VITALS — BP 120/76 | HR 89 | Ht 75.0 in | Wt 334.0 lb

## 2015-12-03 DIAGNOSIS — J45909 Unspecified asthma, uncomplicated: Secondary | ICD-10-CM

## 2015-12-03 DIAGNOSIS — I6529 Occlusion and stenosis of unspecified carotid artery: Secondary | ICD-10-CM | POA: Insufficient documentation

## 2015-12-03 MED ORDER — ALBUTEROL SULFATE HFA 108 (90 BASE) MCG/ACT IN AERS: 1.0000 | INHALATION_SPRAY | Freq: Four times a day (QID) | RESPIRATORY_TRACT | Status: DC | PRN

## 2015-12-03 MED ORDER — ALPRAZOLAM 0.25 MG PO TABS: 0.2500 mg | ORAL_TABLET | Freq: Three times a day (TID) | ORAL | Status: DC | PRN

## 2015-12-03 NOTE — Patient Instructions (Addendum)
Please continue the same medications for blood pressure Let's check the carotid arteries.  you will receive a phone call, about a day and time for an appointment. Please see a lung specialist.  you will receive a phone call, about a day and time for an appointment.   Here is a refill of the alprazolam.   Please come back for a regular physical appointment in 4 months (must be after 03/31/16)

## 2015-12-03 NOTE — Progress Notes (Signed)
Subjective:    Patient ID: [name], male    DOB: 07/03/1955, 60 y.o.   MRN: [id number]  HPI  The state of at least three ongoing medical problems is addressed today, with interval history of each noted here: HTN: he denies sob Asthma: he says albuterol helps.   Anxiety: pt says he is under financial stress.  he takes xanax TID.  He does not take cymbalta. He says the xanax is the only medication he can take that keeps him out of the ER.  He has chronic pain at the right shoulder; persistent despite surgery in 2005 and 2013.   Past Medical History  Diagnosis Date  . ALLERGIC RHINITIS 03/01/2007  . ASTHMA 03/01/2007  . ERECTILE DYSFUNCTION 03/01/2007  . HYPERGLYCEMIA 03/01/2007  . HYPERLIPIDEMIA 03/01/2007  . HYPERTENSION 03/01/2007  . SHINGLES 03/01/2007  . Hx of echocardiogram     done by [facility]   . Shortness of breath   . Sleep apnea     sees Dr. Shelle Iron - sleep study- 06/2011, uses CPAP q night   . Diabetes mellitus without complication (HCC)     , borderline , per pt. , per Dr. [name]  . H/O hiatal hernia   . Arthritis     avascular necrosis - of L shoulder, knees- painful at time s    Past Surgical History  Procedure Laterality Date  . Stress cardiolite  11/01/2001  . Shoulder surgery  2005    Left  . Fx humerus      Left- 2005, required 2 surgeries   . Shoulder hemi-arthroplasty  04/11/2012    Procedure: SHOULDER HEMI-ARTHROPLASTY;  Surgeon: [name], MD;  Location: [facility] OR;  Service: Orthopedics;  Laterality: Left;  LEFT SHOULDER HEMIARTHROPLASTY     Social History   Social History  . Marital Status: Married    Spouse Name: N/A  . Number of Children: 2  . Years of Education: N/A   Occupational History  . [employer]    Social History Main Topics  . Smoking status: Current Every Day Smoker -- 0.50 packs/day for 31 years    Types: Cigarettes  . Smokeless tobacco: Former Neurosurgeon     Comment: smoking less than 1/2 a day (10/08/14)  . Alcohol  Use: No  . Drug Use: No  . Sexual Activity: Not on file   Other Topics Concern  . Not on file   Social History Narrative    Current Outpatient Prescriptions on File Prior to Visit  Medication Sig Dispense Refill  . ibuprofen (ADVIL,MOTRIN) 200 MG tablet Take 400 mg by mouth every 6 (six) hours as needed.    Marland Kitchen losartan-hydrochlorothiazide (HYZAAR) 100-25 MG per tablet TAKE 1 TABLET BY MOUTH EVERY DAY WITH SUPPER 30 tablet 0  . lovastatin (MEVACOR) 40 MG tablet Take 1 tablet (40 mg total) by mouth daily with breakfast. 30 tablet 2  . traMADol (ULTRAM) 50 MG tablet Take 1 tablet (50 mg total) by mouth every 6 (six) hours as needed. for pain 50 tablet 0   No current facility-administered medications on file prior to visit.    No Known Allergies  Family History  Problem Relation Age of Onset  . Cancer Neg Hx     BP 120/76 mmHg  Pulse 89  Ht  (1.905 m)  Wt 334 lb (151.501 kg)  BMI 41.75 kg/m2  SpO2 94%  Review of Systems He has lost a few lbs.  Denies chest pain.  Objective:   Physical Exam VITAL SIGNS:  See vs page GENERAL: no distress LUNGS:  Clear to auscultation.     i personally reviewed spirometry tracing (today): Indication: asthma Impression: restrictive dz  CXR: carotid art Ca++    Assessment & Plan:  HTN: well-controlled. Carotid artery Ca++, new to me Anxiety: well-controlled, on this rx.  restr lung dz, uncertain etiology, possibly due to obesity.  Chronic pain syndrome: well-controlled.  Please continue the same tramadol.  Patient is advised the following: Patient Instructions  Please continue the same medications for blood pressure Let's check the carotid arteries.  you will receive a phone call, about a day and time for an appointment. Please see a lung specialist.  you will receive a phone call, about a day and time for an appointment.   Here is a refill of the alprazolam.   Please come back for a regular physical appointment in 4  months (must be after 03/31/16)  Romero BellingELLISON, Donnis Phaneuf, MD

## 2015-12-23 ENCOUNTER — Other Ambulatory Visit: Payer: Self-pay | Admitting: Endocrinology

## 2015-12-24 NOTE — Telephone Encounter (Signed)
Would you be willing to sign during Dr. George Hugh absence? Thanks!

## 2015-12-25 NOTE — Telephone Encounter (Signed)
ok 

## 2015-12-28 ENCOUNTER — Encounter (HOSPITAL_COMMUNITY): Payer: Self-pay

## 2015-12-28 ENCOUNTER — Emergency Department (HOSPITAL_COMMUNITY): Payer: BLUE CROSS/BLUE SHIELD

## 2015-12-28 ENCOUNTER — Other Ambulatory Visit: Payer: Self-pay | Admitting: Endocrinology

## 2015-12-28 ENCOUNTER — Emergency Department (HOSPITAL_COMMUNITY)
Admission: EM | Admit: 2015-12-28 | Discharge: 2015-12-28 | Disposition: A | Discharge status: Home / Self Care | Payer: BLUE CROSS/BLUE SHIELD | Attending: Emergency Medicine | Admitting: Emergency Medicine

## 2015-12-28 DIAGNOSIS — E119 Type 2 diabetes mellitus without complications: Secondary | ICD-10-CM | POA: Insufficient documentation

## 2015-12-28 DIAGNOSIS — J45909 Unspecified asthma, uncomplicated: Secondary | ICD-10-CM | POA: Diagnosis not present

## 2015-12-28 DIAGNOSIS — I10 Essential (primary) hypertension: Secondary | ICD-10-CM | POA: Insufficient documentation

## 2015-12-28 DIAGNOSIS — Y92410 Unspecified street and highway as the place of occurrence of the external cause: Secondary | ICD-10-CM | POA: Diagnosis not present

## 2015-12-28 DIAGNOSIS — R0781 Pleurodynia: Secondary | ICD-10-CM | POA: Diagnosis not present

## 2015-12-28 DIAGNOSIS — M25512 Pain in left shoulder: Secondary | ICD-10-CM | POA: Diagnosis present

## 2015-12-28 DIAGNOSIS — F17210 Nicotine dependence, cigarettes, uncomplicated: Secondary | ICD-10-CM | POA: Insufficient documentation

## 2015-12-28 DIAGNOSIS — Y999 Unspecified external cause status: Secondary | ICD-10-CM | POA: Diagnosis not present

## 2015-12-28 DIAGNOSIS — Z791 Long term (current) use of non-steroidal anti-inflammatories (NSAID): Secondary | ICD-10-CM | POA: Insufficient documentation

## 2015-12-28 DIAGNOSIS — M79602 Pain in left arm: Secondary | ICD-10-CM

## 2015-12-28 DIAGNOSIS — Y9389 Activity, other specified: Secondary | ICD-10-CM | POA: Diagnosis not present

## 2015-12-28 MED ORDER — HYDROCODONE-ACETAMINOPHEN 5-325 MG PO TABS: 1.0000 | ORAL_TABLET | Freq: Four times a day (QID) | ORAL | 0 refills | Status: DC | PRN

## 2015-12-28 MED ORDER — IBUPROFEN 800 MG PO TABS: 800.0000 mg | ORAL_TABLET | Freq: Three times a day (TID) | ORAL | 0 refills | Status: DC | PRN

## 2015-12-28 NOTE — ED Notes (Signed)
Patient transported to X-ray 
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
## 2015-12-28 NOTE — ED Notes (Signed)
DO, Kristen Ward gave this nurse prescription for patient to get a pre-pack of hydrocodone-acetaminophen 5-325, quantity of 6. This medication was pulled from our pyxis and given to patient. This was witnessed by Docia Barrier and Hyacinth Meeker.

## 2015-12-30 ENCOUNTER — Other Ambulatory Visit: Payer: Self-pay | Admitting: Endocrinology

## 2015-12-30 ENCOUNTER — Other Ambulatory Visit: Payer: Self-pay

## 2015-12-30 ENCOUNTER — Telehealth: Payer: Self-pay | Admitting: Endocrinology

## 2015-12-30 NOTE — Telephone Encounter (Signed)
Pt asking for a refill of the lisinopril, he was unaware that his script was removed or changed please call him for further understanding

## 2015-12-30 NOTE — Telephone Encounter (Signed)
Is he taking tramadol or hydrocodone?

## 2015-12-30 NOTE — Telephone Encounter (Signed)
See below and please advise, Thanks!  

## 2015-12-31 NOTE — Telephone Encounter (Addendum)
I contacted the pt and advised of note below. Pt stated he has not taken the rx for the losartan before. Rx for Lisiniopril 20-12.5 mg has been submitted for the pt. He stated he ran out of this medication yesterday. Pt notified of rx refill.

## 2015-12-31 NOTE — Telephone Encounter (Signed)
please call patient: Our records show that lisinopril was changed to losartan-HCTZ.  Please verify you are on this.  If so, you can d/c lisinopril.

## 2016-01-01 NOTE — Telephone Encounter (Signed)
Pt advised of instructions and voiced understanding.

## 2016-01-01 NOTE — Telephone Encounter (Signed)
Ok, please continue the same lisinopril-HCTZ

## 2016-01-03 ENCOUNTER — Institutional Professional Consult (permissible substitution): Payer: Self-pay | Admitting: Internal Medicine

## 2016-01-03 MED FILL — Hydrocodone-Acetaminophen Tab 5-325 MG: ORAL | Qty: 6 | Status: AC

## 2016-01-29 ENCOUNTER — Other Ambulatory Visit: Payer: Self-pay | Admitting: Endocrinology

## 2016-01-30 ENCOUNTER — Telehealth: Payer: Self-pay | Admitting: Endocrinology

## 2016-01-30 MED ORDER — LOVASTATIN 40 MG PO TABS: 40.0000 mg | ORAL_TABLET | Freq: Every day | ORAL | 2 refills | Status: DC

## 2016-01-30 NOTE — Telephone Encounter (Signed)
Patient stated that the pharmist [facility] and [facility] couldn't [facility] his medication, he needs. lovastatin (MEVACOR) 40 MG tablet  traMADol (ULTRAM) 50 MG tablet  [facility] - [place], [place] - [address] [phone] (Phone) [phone] (Fax)

## 2016-01-30 NOTE — Telephone Encounter (Signed)
Refills resubmitted with Dr. George HughEllison's current DEA number.

## 2016-03-05 ENCOUNTER — Other Ambulatory Visit: Payer: Self-pay | Admitting: Endocrinology

## 2016-03-06 ENCOUNTER — Other Ambulatory Visit: Payer: Self-pay | Admitting: Endocrinology

## 2016-03-06 MED ORDER — TRAMADOL HCL 50 MG PO TABS: 50.0000 mg | ORAL_TABLET | Freq: Four times a day (QID) | ORAL | 2 refills | Status: DC | PRN

## 2016-03-31 ENCOUNTER — Ambulatory Visit: Payer: Self-pay | Admitting: Endocrinology

## 2016-04-01 ENCOUNTER — Other Ambulatory Visit: Payer: Self-pay | Admitting: Endocrinology

## 2016-04-01 ENCOUNTER — Ambulatory Visit: Payer: Self-pay | Admitting: Endocrinology

## 2016-04-02 ENCOUNTER — Other Ambulatory Visit: Payer: Self-pay

## 2016-04-02 MED ORDER — LOVASTATIN 40 MG PO TABS: 40.0000 mg | ORAL_TABLET | Freq: Every day | ORAL | 2 refills | Status: DC

## 2016-04-03 ENCOUNTER — Ambulatory Visit: Payer: Self-pay | Admitting: Endocrinology

## 2016-04-10 ENCOUNTER — Ambulatory Visit: Payer: Self-pay | Admitting: Endocrinology

## 2016-04-12 NOTE — Progress Notes (Deleted)
   Subjective:    Patient ID: [name], male    DOB: Sep 28, 1955, 60 y.o.   MRN: [id number]  HPI    Review of Systems Constitutional: Negative for fever and unexpected weight change.  HENT: Negative for hearing loss.   Eyes: Negative for visual disturbance.  Respiratory: Negative for shortness of breath.   Cardiovascular: Negative for chest pain.  Gastrointestinal: Negative for anal bleeding.  Endocrine: Negative for cold intolerance.  Genitourinary: Negative for hematuria and difficulty urinating.  Musculoskeletal: Positive for back pain.  Skin:       Left foot infection is much better  Allergic/Immunologic: Negative for environmental allergies.  Neurological: Negative for numbness.  Hematological: Does not bruise/bleed easily.  Psychiatric/Behavioral:       Anxiety is much better     Objective:   Physical Exam VS: see vs page GEN: no distress HEAD: head: no deformity eyes: no periorbital swelling, no proptosis external nose and ears are normal mouth: no lesion seen NECK: supple, thyroid is not enlarged CHEST WALL: no deformity LUNGS: clear to auscultation BREASTS:  No gynecomastia CV: reg rate and rhythm, no murmur ABD: abdomen is soft, nontender.  no hepatosplenomegaly.  not distended.  no hernia RECTAL: normal external and internal exam.  heme neg. PROSTATE:  Normal size.  No nodule MUSCULOSKELETAL: muscle bulk and strength are grossly normal.  no obvious joint swelling.  gait is normal and steady EXTEMITIES: no deformity.  no openulcer on the feet. feet are of normal color and temp.  1+ bilat edema.  On the left foot (centered at the lateral aspect of the great toe MTP), there is a 3x6 cm white bulla, but no erythema/tenderness/warmth/drainage. PULSES: dorsalis pedis intact bilat.  no carotid bruit NEURO:  cn 2-12 grossly intact.   readily moves all 4's.  sensation is intact to touch on the feet SKIN:  Normal texture and temperature.  No rash or suspicious  lesion is visible.   NODES:  None palpable at the neck PSYCH: alert, well-oriented.  Does not appear anxious nor depressed.       Assessment & Plan:

## 2016-04-14 ENCOUNTER — Ambulatory Visit: Payer: Self-pay | Admitting: Endocrinology

## 2016-04-29 ENCOUNTER — Ambulatory Visit: Payer: Self-pay | Admitting: Endocrinology

## 2016-05-01 ENCOUNTER — Ambulatory Visit: Payer: Self-pay | Admitting: Endocrinology

## 2016-05-03 NOTE — Progress Notes (Deleted)
   Subjective:    Patient ID: [name], male    DOB: 1955-12-21, 60 y.o.   MRN: [id number]  HPI    Review of Systems Constitutional: Negative for fever and unexpected weight change.  HENT: Negative for hearing loss.   Eyes: Negative for visual disturbance.  Respiratory: Negative for shortness of breath.   Cardiovascular: Negative for chest pain.  Gastrointestinal: Negative for anal bleeding.  Endocrine: Negative for cold intolerance.  Genitourinary: Negative for hematuria and difficulty urinating.  Musculoskeletal: Positive for back pain.  Skin:       Left foot infection is much better  Allergic/Immunologic: Negative for environmental allergies.  Neurological: Negative for numbness.  Hematological: Does not bruise/bleed easily.  Psychiatric/Behavioral:       Anxiety is much better     Objective:   Physical Exam VS: see vs page GEN: no distress HEAD: head: no deformity eyes: no periorbital swelling, no proptosis external nose and ears are normal mouth: no lesion seen NECK: supple, thyroid is not enlarged CHEST WALL: no deformity LUNGS: clear to auscultation BREASTS:  No gynecomastia CV: reg rate and rhythm, no murmur ABD: abdomen is soft, nontender.  no hepatosplenomegaly.  not distended.  no hernia RECTAL: normal external and internal exam.  heme neg. PROSTATE:  Normal size.  No nodule MUSCULOSKELETAL: muscle bulk and strength are grossly normal.  no obvious joint swelling.  gait is normal and steady EXTEMITIES: no deformity.  no openulcer on the feet. feet are of normal color and temp.  1+ bilat edema.  On the left foot (centered at the lateral aspect of the great toe MTP), there is a 3x6 cm white bulla, but no erythema/tenderness/warmth/drainage. PULSES: dorsalis pedis intact bilat.  no carotid bruit NEURO:  cn 2-12 grossly intact.   readily moves all 4's.  sensation is intact to touch on the feet SKIN:  Normal texture and temperature.  No rash or suspicious  lesion is visible.   NODES:  None palpable at the neck PSYCH: alert, well-oriented.  Does not appear anxious nor depressed.  i personally reviewed electrocardiogram tracing (today): Indication:  Impression:     Assessment & Plan:

## 2016-05-04 ENCOUNTER — Ambulatory Visit: Payer: Self-pay | Admitting: Endocrinology

## 2016-05-04 DIAGNOSIS — Z0289 Encounter for other administrative examinations: Secondary | ICD-10-CM

## 2016-05-31 NOTE — Progress Notes (Deleted)
   Subjective:    Patient ID: [name], male    DOB: 1956/01/14, 61 y.o.   MRN: [id number]  HPI The state of at least three ongoing medical problems is addressed today, with interval history of each noted here: HTN: he denies sob Asthma: he says albuterol helps.   Anxiety: pt says he is under financial stress.  he takes xanax TID.  He does not take cymbalta. He says the xanax is the only medication he can take that keeps him out of the ER.  He has chronic pain at the right shoulder; persistent despite surgery in 2005 and 2013.     Review of Systems     Objective:   Physical Exam        Assessment & Plan:

## 2016-06-04 ENCOUNTER — Ambulatory Visit: Payer: Self-pay | Admitting: Endocrinology

## 2016-06-05 ENCOUNTER — Telehealth: Payer: Self-pay | Admitting: Endocrinology
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
## 2016-06-23 ENCOUNTER — Other Ambulatory Visit: Payer: Self-pay | Admitting: Endocrinology

## 2016-06-24 ENCOUNTER — Encounter: Payer: Self-pay | Admitting: Endocrinology

## 2016-06-24 MED ORDER — ALPRAZOLAM 0.25 MG PO TABS: 0.2500 mg | ORAL_TABLET | Freq: Three times a day (TID) | ORAL | 0 refills | Status: DC | PRN

## 2016-06-24 NOTE — Telephone Encounter (Signed)
I contacted the patient and advised of message. Patient voiced understanding and scheduled appointment for 07/15/2016.

## 2016-06-24 NOTE — Telephone Encounter (Signed)
See message and please advise, Thanks!  

## 2016-06-24 NOTE — Telephone Encounter (Signed)
I printed current dosage x 1 Ov is needed, as your symptoms are worse.

## 2016-06-24 NOTE — Telephone Encounter (Signed)
Patient stated his xanax  pills are not helping him any more and would like to know if he could double up on his pills please advise

## 2016-07-14 ENCOUNTER — Ambulatory Visit (INDEPENDENT_AMBULATORY_CARE_PROVIDER_SITE_OTHER): Payer: Self-pay | Admitting: Endocrinology

## 2016-07-14 ENCOUNTER — Encounter: Payer: Self-pay | Admitting: Endocrinology

## 2016-07-14 VITALS — BP 106/64 | HR 77 | Ht 75.0 in | Wt 309.0 lb

## 2016-07-14 DIAGNOSIS — Z23 Encounter for immunization: Secondary | ICD-10-CM

## 2016-07-14 DIAGNOSIS — R7309 Other abnormal glucose: Secondary | ICD-10-CM

## 2016-07-14 DIAGNOSIS — Z125 Encounter for screening for malignant neoplasm of prostate: Secondary | ICD-10-CM

## 2016-07-14 DIAGNOSIS — Z0000 Encounter for general adult medical examination without abnormal findings: Secondary | ICD-10-CM

## 2016-07-14 LAB — CBC WITH DIFFERENTIAL/PLATELET
Basophils Absolute: 0.1 10*3/uL (ref 0.0–0.1)
Basophils Relative: 0.6 % (ref 0.0–3.0)
EOS ABS: 0.1 10*3/uL (ref 0.0–0.7)
Eosinophils Relative: 0.9 % (ref 0.0–5.0)
HCT: 48.5 % (ref 39.0–52.0)
HEMOGLOBIN: 16.3 g/dL (ref 13.0–17.0)
Lymphocytes Relative: 24.9 % (ref 12.0–46.0)
Lymphs Abs: 3 10*3/uL (ref 0.7–4.0)
MCHC: 33.7 g/dL (ref 30.0–36.0)
MCV: 90.4 fl (ref 78.0–100.0)
MONO ABS: 0.6 10*3/uL (ref 0.1–1.0)
Monocytes Relative: 4.9 % (ref 3.0–12.0)
NEUTROS PCT: 68.7 % (ref 43.0–77.0)
Neutro Abs: 8.3 10*3/uL — ABNORMAL HIGH (ref 1.4–7.7)
Platelets: 198 10*3/uL (ref 150.0–400.0)
RBC: 5.37 Mil/uL (ref 4.22–5.81)
RDW: 13.6 % (ref 11.5–15.5)
WBC: 12.1 10*3/uL — AB (ref 4.0–10.5)

## 2016-07-14 LAB — HEPATIC FUNCTION PANEL
ALT: 16 U/L (ref 0–53)
AST: 13 U/L (ref 0–37)
Albumin: 4.3 g/dL (ref 3.5–5.2)
Alkaline Phosphatase: 44 U/L (ref 39–117)
BILIRUBIN TOTAL: 0.6 mg/dL (ref 0.2–1.2)
Bilirubin, Direct: 0.1 mg/dL (ref 0.0–0.3)
Total Protein: 6.9 g/dL (ref 6.0–8.3)

## 2016-07-14 LAB — LIPID PANEL
Cholesterol: 122 mg/dL (ref 0–200)
HDL: 42.9 mg/dL (ref >39.00)
LDL CALC: 61 mg/dL (ref 0–99)
NONHDL: 79.44
Total CHOL/HDL Ratio: 3
Triglycerides: 90 mg/dL (ref 0.0–149.0)
VLDL: 18 mg/dL (ref 0.0–40.0)

## 2016-07-14 LAB — BASIC METABOLIC PANEL
BUN: 15 mg/dL (ref 6–23)
CHLORIDE: 99 meq/L (ref 96–112)
CO2: 28 meq/L (ref 19–32)
CREATININE: 0.85 mg/dL (ref 0.40–1.50)
Calcium: 9.2 mg/dL (ref 8.4–10.5)
GFR: 97.69 mL/min (ref >60.00)
GLUCOSE: 80 mg/dL (ref 70–99)
Potassium: 4.1 mEq/L (ref 3.5–5.1)
Sodium: 134 mEq/L — ABNORMAL LOW (ref 135–145)

## 2016-07-14 LAB — PSA: PSA: 0.85 ng/mL (ref 0.10–4.00)

## 2016-07-14 LAB — TSH: TSH: 1.42 u[IU]/mL (ref 0.35–4.50)

## 2016-07-14 MED ORDER — LISINOPRIL-HYDROCHLOROTHIAZIDE 20-12.5 MG PO TABS: 1.0000 | ORAL_TABLET | Freq: Every day | ORAL | 3 refills | Status: DC

## 2016-07-14 NOTE — Progress Notes (Signed)
Subjective:    Patient ID: [name], male    DOB: Oct 25, 1955, 61 y.o.   MRN: [id number]  HPI Pt is here for regular wellness examination, and is feeling pretty well in general, and says chronic med probs are stable, except as noted below Past Medical History:  Diagnosis Date  . ALLERGIC RHINITIS 03/01/2007  . Arthritis    avascular necrosis - of L shoulder, knees- painful at time s  . ASTHMA 03/01/2007  . Diabetes mellitus without complication (HCC)    , borderline , per pt. , per Dr. Everardo AllEllison  . ERECTILE DYSFUNCTION 03/01/2007  . H/O hiatal hernia   . Hx of echocardiogram    done by [facility]   . HYPERGLYCEMIA 03/01/2007  . HYPERLIPIDEMIA 03/01/2007  . HYPERTENSION 03/01/2007  . SHINGLES 03/01/2007  . Shortness of breath   . Sleep apnea    sees Dr. Shelle Ironlance - sleep study- 06/2011, uses CPAP q night     Past Surgical History:  Procedure Laterality Date  . Fx Humerus     Left- 2005, required 2 surgeries   . SHOULDER HEMI-ARTHROPLASTY  04/11/2012   Procedure: SHOULDER HEMI-ARTHROPLASTY;  Surgeon: Mable ParisJustin William Chandler, MD;  [facility];  Service: Orthopedics;  Laterality: Left;  LEFT SHOULDER HEMIARTHROPLASTY   . SHOULDER SURGERY  2005   Left  . Stress Cardiolite  11/01/2001    Social History   Social History  . Marital status: Married    Spouse name: N/A  . Number of children: 2  . Years of education: N/A   Occupational History  . [employer]    Social History Main Topics  . Smoking status: Current Every Day Smoker    Packs/day: 0.50    Years: 31.00    Types: Cigarettes  . Smokeless tobacco: Former NeurosurgeonUser     Comment: smoking less than 1/2 a day (10/08/14)  . Alcohol use No  . Drug use: No  . Sexual activity: Not on file   Other Topics Concern  . Not on file   Social History Narrative  . No narrative on file    Current Outpatient Prescriptions on File Prior to Visit  Medication Sig Dispense Refill  . ALPRAZolam (XANAX) 0.25 MG tablet Take 1  tablet (0.25 mg total) by mouth 3 (three) times daily as needed. for anxiety 90 tablet 0  . lovastatin (MEVACOR) 40 MG tablet Take 1 tablet (40 mg total) by mouth daily with breakfast. 30 tablet 2  . traMADol (ULTRAM) 50 MG tablet Take 1 tablet (50 mg total) by mouth every 6 (six) hours as needed. for pain 50 tablet 2  . albuterol (PROVENTIL HFA;VENTOLIN HFA) 108 (90 Base) MCG/ACT inhaler Inhale 1-2 puffs into the lungs every 6 (six) hours as needed for wheezing or shortness of breath. (Patient not taking: Reported on 07/14/2016) 1 Inhaler 0  . ibuprofen (ADVIL,MOTRIN) 800 MG tablet Take 1 tablet (800 mg total) by mouth every 8 (eight) hours as needed for mild pain. (Patient not taking: Reported on 07/14/2016) 30 tablet 0   No current facility-administered medications on file prior to visit.     No Known Allergies  Family History  Problem Relation Age of Onset  . Cancer Neg Hx     BP 106/64   Pulse 77   Ht 6\' 3"  (1.905 m)   Wt (!) 309 lb (140.2 kg)   SpO2 97%   BMI 38.62 kg/m     Review of Systems Constitutional: Negative for fever.  He has lost weight.  HENT: Negative for hearing loss.   Eyes: Negative for visual disturbance.  Respiratory: Negative for shortness of breath.   Cardiovascular: Negative for chest pain.  Gastrointestinal: Negative for anal bleeding.  Endocrine: Negative for cold intolerance.  Genitourinary: Negative for hematuria and difficulty urinating.  Musculoskeletal: denies back pain.  shoulder pain persists.  He says he cannot stop tramadol Skin: denies rash.   Allergic/Immunologic: Negative for environmental allergies.  Neurological: Negative for numbness.  Hematological: Does not bruise/bleed easily.  Psychiatric/Behavioral: depression persists, since wife's suicide.  Denies SI.      Objective:   Physical Exam VS: see vs page GEN: no distress HEAD: head: no deformity eyes: no periorbital swelling, no proptosis external nose and ears are  normal mouth: no lesion seen NECK: supple, thyroid is not enlarged CHEST WALL: no deformity LUNGS: clear to auscultation BREASTS:  No gynecomastia CV: reg rate and rhythm, no murmur ABD: abdomen is soft, nontender.  no hepatosplenomegaly.  not distended.  no hernia RECTAL: normal external and internal exam.  heme neg. PROSTATE:  Normal size.  No nodule MUSCULOSKELETAL: muscle bulk and strength are grossly normal.  no obvious joint swelling.  gait is normal and steady EXTEMITIES: no deformity.  no openulcer on the feet. feet are of normal color and temp.  trace bilat leg edema. There is bilateral onychomycosis of the toenails.   PULSES: dorsalis pedis intact bilat.  no carotid bruit NEURO:  cn 2-12 grossly intact.   readily moves all 4's.  sensation is intact to touch on the feet SKIN:  Normal texture and temperature.  No rash or suspicious lesion is visible.   NODES:  None palpable at the neck PSYCH: alert, well-oriented.  Does not appear anxious nor depressed.     I personally reviewed electrocardiogram tracing (today): Indication: HTN Impression: First degree A-V block  No MI.  No hypertrophy. Compared to 2016: no change.       Assessment & Plan:  Wellness visit today, with problems stable, except as noted.   Depression:  He declines to see psychiatrist.   Left shoulder pain: he says he cannot stop tramadol.   HTN: overcontrolled.

## 2016-07-14 NOTE — Patient Instructions (Addendum)
Please reduce the lisinopril-HCTZ, to 1 per day.   Please consider these measures for your health:  minimize alcohol.  Do not use tobacco products.  Have a colonoscopy at least every 10 years from age 60.  Keep firearms safely stored.  Always use seat belts.  have working smoke alarms in your home.  See an eye doctor and dentist regularly.  Never drive under the influence of alcohol or drugs (including prescription drugs).  Those with fair skin should take precautions against the sun, and should carefully examine their skin once per month, for any new or changed moles.  blood tests are requested for you today.  We'll let you know about the results. Please come back for a follow-up appointment in 6 months.

## 2016-07-15 LAB — HIV ANTIBODY (ROUTINE TESTING W REFLEX): HIV: NONREACTIVE

## 2016-07-21 ENCOUNTER — Telehealth: Payer: Self-pay | Admitting: Endocrinology

## 2016-07-21 MED ORDER — ALPRAZOLAM 0.25 MG PO TABS: 0.2500 mg | ORAL_TABLET | Freq: Three times a day (TID) | ORAL | 0 refills | Status: DC | PRN

## 2016-07-21 NOTE — Telephone Encounter (Signed)
No, it is not safe to increase.  Please continue the same medication.

## 2016-07-21 NOTE — Telephone Encounter (Signed)
I contacted the pharmacy and was advised by Lyla Sonarrie the patient's Xanax was filled on 07/01/2016 and the prescription was written for 1 tab three times daily as needed.

## 2016-07-21 NOTE — Telephone Encounter (Signed)
[facility] state that [facility] will not [facility] his xanax because Dr Everardo AllEllison did not put the right dosage in for three times a day, so they won't fill it. And he need his medicine he only have one pill left.  He ask you to please please!!   [facility] [address] [place] Phone: [phone]

## 2016-07-21 NOTE — Telephone Encounter (Signed)
Patient wanted to know if we could give him anything to hold him over until he is able to refill his xanax on 07/29/2016? Patient has one more pill left.  Please advise, Thanks!

## 2016-07-21 NOTE — Telephone Encounter (Signed)
I printed  

## 2016-07-21 NOTE — Telephone Encounter (Signed)
Ok, so rx is good, thanks.

## 2016-07-21 NOTE — Telephone Encounter (Signed)
Patient advised we would have to wait till 07/29/2016 to have this medication refilled and we could not refill it sooner via voicemail. Requested a call back if the patient would like to discuss further.

## 2016-07-21 NOTE — Telephone Encounter (Signed)
The Rx for Xanax 3 times per day will not be able to filled until 07/29/2016. Could another medication be called in until the patient is able to get his refill for the xanax on 07/29/2016?

## 2016-07-21 NOTE — Telephone Encounter (Signed)
Ok, I printed again

## 2016-07-21 NOTE — Telephone Encounter (Signed)
rx on 06/24/16 says TID PRN.  I need to know why they won't fill.

## 2016-07-21 NOTE — Telephone Encounter (Signed)
See message and please advise, Thanks!  

## 2016-07-21 NOTE — Telephone Encounter (Signed)
I contacted the patient. Patient stated when his wife passed away he increased the xanax to 4 times a day. Patient stated that is why he running short. Patient wanted to know if the rx could be changed to 4 times per day? Please advise, Thanks!

## 2016-07-27 NOTE — Telephone Encounter (Signed)
Patient ask you to give him a call, concerning his xanax.

## 2016-07-28 MED ORDER — ALPRAZOLAM 0.25 MG PO TABS: 0.2500 mg | ORAL_TABLET | Freq: Three times a day (TID) | ORAL | 0 refills | Status: DC | PRN

## 2016-07-28 NOTE — Telephone Encounter (Signed)
I contacted the patient and stated he would be eligable to get his refill on the xanax tomorrow and wanted to know if we could send a prescription? Patient was unable to pick the rx up that was printed on sent 07/21/2016.

## 2016-07-28 NOTE — Telephone Encounter (Signed)
I printed  

## 2016-07-28 NOTE — Telephone Encounter (Signed)
[facility]

## 2016-07-30 NOTE — Telephone Encounter (Signed)
Patient stated pharmacy haven't received request for medication, xanax  please resubmit  [facility] [address] - , [place] - [address] [phone] (Phone) [phone] (Fax)

## 2016-07-30 NOTE — Telephone Encounter (Signed)
Rx originally submitted on 07/28/2016. Rx resubmitted on 07/30/2016.

## 2016-07-31 ENCOUNTER — Other Ambulatory Visit: Payer: Self-pay | Admitting: Endocrinology

## 2016-07-31 MED ORDER — TRAMADOL HCL 50 MG PO TABS: 50.0000 mg | ORAL_TABLET | Freq: Four times a day (QID) | ORAL | 2 refills | Status: DC | PRN

## 2016-08-21 ENCOUNTER — Other Ambulatory Visit: Payer: Self-pay | Admitting: Endocrinology

## 2016-08-24 ENCOUNTER — Telehealth: Payer: Self-pay | Admitting: Endocrinology

## 2016-08-24 NOTE — Telephone Encounter (Signed)
[facility] Call:  Caller states he needs an Rx Refill for Lovostatin 40 MG once daily called into the pharmacy.  They said they need the [facility] to refill.  [facility] in [place].

## 2016-08-24 NOTE — Telephone Encounter (Signed)
Rx submitted

## 2016-09-22 ENCOUNTER — Other Ambulatory Visit: Payer: Self-pay | Admitting: Endocrinology

## 2016-10-23 ENCOUNTER — Other Ambulatory Visit: Payer: Self-pay | Admitting: Endocrinology

## 2016-10-27 ENCOUNTER — Other Ambulatory Visit: Payer: Self-pay | Admitting: Endocrinology

## 2016-10-27 ENCOUNTER — Telehealth: Payer: Self-pay | Admitting: Endocrinology

## 2016-10-27 MED ORDER — ALPRAZOLAM 0.25 MG PO TABS: 0.2500 mg | ORAL_TABLET | Freq: Three times a day (TID) | ORAL | 0 refills | Status: DC | PRN

## 2016-10-27 NOTE — Telephone Encounter (Signed)
I have never seen this patient. Not sure why I have been forwarded this. Assume this should be routed to Dr. Everardo AllEllison?

## 2016-10-27 NOTE — Telephone Encounter (Signed)
**  Remind patient they can make refill requests via MyChart**  Medication refill request (Name & Dosage):  ALPRAZolam (XANAX) 0.25 MG tablet  Preferred pharmacy (Name & Address):  [facility] - [place], [place] - [address]   Other comments (if applicable):    Pharmacy already faxed request twice.

## 2016-10-27 NOTE — Telephone Encounter (Signed)
Rx printed awaiting to be signed by Dr Everardo AllEllison.

## 2016-10-27 NOTE — Telephone Encounter (Signed)
Please see below.  Thank you.

## 2016-10-28 NOTE — Telephone Encounter (Signed)
Patient called to inquire about the rx for his xanax. Patient was very upset that he hasn't heard anything back yet. I spoke to [place] who stated she would [facility] [place].   I advised the patient and told him that it is being faxed now and the pharmacy should have it within the hour and to call us with any concerns. Patient was appreciative.

## 2016-10-28 NOTE — Telephone Encounter (Signed)
This has been sent

## 2016-11-17 NOTE — Congregational Nurse Program (Signed)
Congregational Nurse Program Note  Date of Encounter: 11/12/2016  Past Medical History: Past Medical History:  Diagnosis Date  . ALLERGIC RHINITIS 03/01/2007  . Arthritis    avascular necrosis - of L shoulder, knees- painful at time s  . ASTHMA 03/01/2007  . Diabetes mellitus without complication (HCC)    , borderline , per pt. , per Dr. Everardo AllEllison  . ERECTILE DYSFUNCTION 03/01/2007  . H/O hiatal hernia   . Hx of echocardiogram    done by [place]   . HYPERGLYCEMIA 03/01/2007  . HYPERLIPIDEMIA 03/01/2007  . HYPERTENSION 03/01/2007  . SHINGLES 03/01/2007  . Shortness of breath   . Sleep apnea    sees Dr. Shelle Ironlance - sleep study- 06/2011, uses CPAP q night     Encounter Details:     CNP Questionnaire - 11/17/16 1603      Patient Demographics   Is this a new or existing patient? New   Patient is considered a/an Not Applicable   Race Caucasian/White     Patient Assistance   Location of Patient Assistance [place] [address]   Patient's financial/insurance status Low Income;Self-Pay (Uninsured)   Uninsured Patient ([employer] Research officer, trade unionCard/Care Connects) Yes   Interventions Not Applicable   Patient referred to apply for the following financial assistance Not Applicable   Food insecurities addressed Not Applicable   Transportation assistance No   Assistance securing medications No   Educational Designer, television/film sethealth offerings Behavioral health     Encounter Details   Primary purpose of visit Other   Was an Emergency Department visit averted? Not Applicable   Does patient have a medical provider? Yes   Patient referred to Doctor referral for a non-emergent behavioral health crisis   Was a mental health screening completed? (GAINS tool) Yes   Was a mental health referral made? Yes   Does patient have dental issues? No   Does patient have vision issues? No   Does your patient have an abnormal blood pressure today? No   Since previous encounter, have you referred patient for abnormal blood pressure that resulted in a new  diagnosis or medication change? No   Does your patient have an abnormal blood glucose today? No   Since previous encounter, have you referred patient for abnormal blood glucose that resulted in a new diagnosis or medication change? No   Was there a life-saving intervention made? No     Client was referred to Child psychotherapistsocial worker by Interior and spatial designerdirector at [employer] for case management for assistance with finding employment.   Social worker completed GAIN-SS with client and client scored a total of 3 for the past 90 days. Client denied suicidal ideation, homicidal ideation and auditory and visual hallucinations. Client denied any current substance use. 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   Client reported that his wife died by suicide in January 2018. He said his wife had been depressed because the family was struggling financially. The client has been out of full time work due to injuries to his back and shoulder. Social worker referred client to mental health services for counseling. Social worker will research additional group options for client.   Social worker told client she will look for job resources for him and will meet with him next week for check-in/supporvie counseling and additional resources for employment. Irwin BrakemanAmber Johnny Latu, MSW, [phone]

## 2016-11-19 ENCOUNTER — Telehealth: Payer: Self-pay

## 2016-11-20 ENCOUNTER — Other Ambulatory Visit: Payer: Self-pay | Admitting: Endocrinology

## 2016-11-21 ENCOUNTER — Other Ambulatory Visit: Payer: Self-pay | Admitting: Endocrinology

## 2016-11-24 ENCOUNTER — Other Ambulatory Visit: Payer: Self-pay | Admitting: Endocrinology

## 2016-11-24 ENCOUNTER — Telehealth: Payer: Self-pay

## 2016-11-24 NOTE — Telephone Encounter (Signed)
I have accessed the [place] controlled substances database today.  No suspicious activity I printed 

## 2016-12-10 ENCOUNTER — Telehealth: Payer: Self-pay

## 2017-01-11 ENCOUNTER — Ambulatory Visit: Payer: Self-pay | Admitting: Endocrinology

## 2017-01-18 ENCOUNTER — Encounter: Payer: Self-pay | Admitting: Endocrinology

## 2017-01-18 ENCOUNTER — Other Ambulatory Visit: Payer: Self-pay

## 2017-01-18 ENCOUNTER — Ambulatory Visit (INDEPENDENT_AMBULATORY_CARE_PROVIDER_SITE_OTHER): Payer: Self-pay | Admitting: Endocrinology

## 2017-01-18 VITALS — BP 124/82 | HR 71 | Wt 335.4 lb

## 2017-01-18 DIAGNOSIS — M25512 Pain in left shoulder: Secondary | ICD-10-CM

## 2017-01-18 DIAGNOSIS — G8929 Other chronic pain: Secondary | ICD-10-CM

## 2017-01-18 MED ORDER — LOSARTAN POTASSIUM-HCTZ 100-25 MG PO TABS: 0.5000 | ORAL_TABLET | Freq: Every day | ORAL | 11 refills | Status: DC

## 2017-01-18 NOTE — Progress Notes (Signed)
Subjective:    Patient ID: [name], male    DOB: 28-Jul-1955, 61 y.o.   MRN: [id number]  HPI He has no insurance.   The state of at least three ongoing medical problems is addressed today, with interval history of each noted here: Anxiety: he seldom needs xanax.  This is a stable problem. Left shoulder pain: persists.  This is a stable problem. Asthma: he has intermitt wheezing. This is a stable problem. Past Medical History:  Diagnosis Date  . ALLERGIC RHINITIS 03/01/2007  . Arthritis    avascular necrosis - of L shoulder, knees- painful at time s  . ASTHMA 03/01/2007  . Diabetes mellitus without complication (HCC)    , borderline , per pt. , per Dr. [name]  . ERECTILE DYSFUNCTION 03/01/2007  . H/O hiatal hernia   . Hx of echocardiogram    done by [facility]   . HYPERGLYCEMIA 03/01/2007  . HYPERLIPIDEMIA 03/01/2007  . HYPERTENSION 03/01/2007  . SHINGLES 03/01/2007  . Shortness of breath   . Sleep apnea    sees Dr. Shelle Iron - sleep study- 06/2011, uses CPAP q night     Past Surgical History:  Procedure Laterality Date  . Fx Humerus     Left- 2005, required 2 surgeries   . SHOULDER HEMI-ARTHROPLASTY  04/11/2012   Procedure: SHOULDER HEMI-ARTHROPLASTY;  Surgeon: [name], MD;  Location: [facility];  Service: Orthopedics;  Laterality: Left;  LEFT SHOULDER HEMIARTHROPLASTY   . SHOULDER SURGERY  2005   Left  . Stress Cardiolite  11/01/2001    Social History   Social History  . Marital status: Married    Spouse name: N/A  . Number of children: 2  . Years of education: N/A   Occupational History  . [employer]    Social History Main Topics  . Smoking status: Current Every Day Smoker    Packs/day: 0.50    Years: 31.00    Types: Cigarettes  . Smokeless tobacco: Former Neurosurgeon     Comment: smoking less than 1/2 a day (10/08/14)  . Alcohol use No  . Drug use: No  . Sexual activity: Not on file   Other Topics Concern  . Not on file   Social History  Narrative  . No narrative on file    Current Outpatient Prescriptions on File Prior to Visit  Medication Sig Dispense Refill  . albuterol (PROVENTIL HFA;VENTOLIN HFA) 108 (90 Base) MCG/ACT inhaler Inhale 1-2 puffs into the lungs every 6 (six) hours as needed for wheezing or shortness of breath. (Patient not taking: Reported on 07/14/2016) 1 Inhaler 0  . ALPRAZolam (XANAX) 0.25 MG tablet TAKE 1 TABLET BY MOUTH THREE TIMES DAILY AS NEEDED FOR ANXIETY 90 tablet 3  . ibuprofen (ADVIL,MOTRIN) 800 MG tablet Take 1 tablet (800 mg total) by mouth every 8 (eight) hours as needed for mild pain. (Patient not taking: Reported on 07/14/2016) 30 tablet 0  . lovastatin (MEVACOR) 40 MG tablet Take 1 tablet (40 mg total) by mouth daily with breakfast. 30 tablet 2   No current facility-administered medications on file prior to visit.     No Known Allergies  Family History  Problem Relation Age of Onset  . Cancer Neg Hx     BP 124/82   Pulse 71   Wt (!) 335 lb 6.4 oz (152.1 kg)   SpO2 97%   BMI 41.92 kg/m    Review of Systems He has lost a few lbs, due to his efforts.  Objective:   Physical Exam VITAL SIGNS:  See vs page GENERAL: no distress LUNGS:  Clear to auscultation.  HEART:  Regular rate and rhythm without murmurs noted. Normal S1,S2.   Ext: no leg edema.       Assessment & Plan:  Anxiety: improved off xanax.  Left shoulder pain: persistent.  Asthma: stopping acei might help.   Patient Instructions  I have sent a prescription to your pharmacy, to change lisinopril-HCTZ to losartan-HCTZ.  I hope this will reduce your need for the inhaler.   Try a cream or gel to your left shoulder pain.  Please come back for a follow-up appointment in 6 months.

## 2017-01-18 NOTE — Patient Instructions (Addendum)
I have sent a prescription to your pharmacy, to change lisinopril-HCTZ to losartan-HCTZ.  I hope this will reduce your need for the inhaler.   Try a cream or gel to your left shoulder pain.  Please come back for a follow-up appointment in 6 months.

## 2017-01-19 ENCOUNTER — Ambulatory Visit: Payer: Self-pay | Admitting: Endocrinology

## 2017-01-20 ENCOUNTER — Telehealth: Payer: Self-pay

## 2017-01-20 NOTE — Congregational Nurse Program (Signed)
Congregational Nurse Program Note  Date of Encounter: 01/14/2017  Past Medical History: Past Medical History:  Diagnosis Date  . ALLERGIC RHINITIS 03/01/2007  . Arthritis    avascular necrosis - of L shoulder, knees- painful at time s  . ASTHMA 03/01/2007  . Diabetes mellitus without complication (HCC)    , borderline , per pt. , per Dr. Loanne Drilling  . ERECTILE DYSFUNCTION 03/01/2007  . H/O hiatal hernia   . Hx of echocardiogram    done by [facility]   . HYPERGLYCEMIA 03/01/2007  . HYPERLIPIDEMIA 03/01/2007  . HYPERTENSION 03/01/2007  . SHINGLES 03/01/2007  . Shortness of breath   . Sleep apnea    sees Dr. Gwenette Greet - sleep study- 06/2011, uses CPAP q night     Encounter Details:     CNP Questionnaire - 01/14/17 1453      Patient Demographics   Is this a new or existing patient? New   Patient is considered a/an Not Applicable   Race Caucasian/White     Patient Assistance   Location of Patient Assistance [address] 2540MM   Patient's financial/insurance status Low Income;Self-Pay (Uninsured)   Uninsured Patient ([employer] Card/Care Connects) Yes   Interventions Counseled to make appt. with provider   Patient referred to apply for the following financial assistance Not Applicable   Food insecurities addressed Not Applicable   Transportation assistance No   Assistance securing medications No   Educational health offerings Other     Encounter Details   Primary purpose of visit Other   Was an Emergency Department visit averted? Not Applicable   Does patient have a medical provider? Yes   Patient referred to Other   Was a mental health screening completed? (GAINS tool) No   Was a mental health referral made? No   Does patient have dental issues? No   Does patient have vision issues? No   Does your patient have an abnormal blood pressure today? No   Since previous encounter, have you referred patient for abnormal blood pressure that resulted in a new diagnosis or medication change? No   Does  your patient have an abnormal blood glucose today? No   Since previous encounter, have you referred patient for abnormal blood glucose that resulted in a new diagnosis or medication change? No   Was there a life-saving intervention made? No    Social worker met with Chartered certified accountant of [employer], Vladimir Crofts, to help client with employment. Client will continue to follow up with [facility]. Client reported that he was currently seeking counseling through his church.  Ubaldo Glassing, MSW, [phone]

## 2017-01-20 NOTE — Telephone Encounter (Signed)
Social worker called client to schedule meeting with Windy Carina at [employer] to discuss employment opportunities on 01/14/2017. Client agreed to meet at that time.  Irwin Brakeman, MSW, [phone]

## 2017-01-20 NOTE — Telephone Encounter (Signed)
Social worker called client to see how job resource worked for him and client stated the staffing agency was unable to assist him. Social worker told client she would set up a meeting with Windy CarinaMartin Roberts, executive director at [employer] [phone], once he returns from his vacation, in order to meet and talk with him about employment opportunities.  Irwin BrakemanAmber Elia Nunley, MSW, [phone]

## 2017-01-20 NOTE — Telephone Encounter (Signed)
Social worker called client to provide him with job resource:[employer]. Client stated he has went [facility] for counseling and support groups. Social worker will continue to follow up with client.   Irwin BrakemanAmber Kodi Steil, MSW, [phone]

## 2017-01-20 NOTE — Telephone Encounter (Signed)
Social worker called client to let him know she will not be able to attend their scheduled meeting for job resources and supportive counseling due to illness. Social worker will call client to reschedule. Irwin Brakeman, MSW, [phone]

## 2017-02-19 ENCOUNTER — Other Ambulatory Visit: Payer: Self-pay | Admitting: Endocrinology

## 2017-02-19 NOTE — Telephone Encounter (Signed)
MEDICATION: lovastatin (MEVACOR) 40 MG tablet  PHARMACY:   [facility] - [place], [place] - [address] [phone] (Phone) [phone] (Fax)   IS THIS A 90 DAY SUPPLY : yes  IS PATIENT OUT OF MEDICATION: yes  IF NOT; HOW MUCH IS LEFT: n/a  LAST APPOINTMENT DATE: /27/2018  NEXT APPOINTMENT DATE:@2 /27/2019  OTHER COMMENTS:    **Let patient know to contact pharmacy at the end of the day to make sure medication is ready. **  ** Please notify patient to allow 48-72 hours to process**  **Encourage patient to contact the pharmacy for refills or they can request refills through [facility]**

## 2017-02-22 ENCOUNTER — Other Ambulatory Visit: Payer: Self-pay

## 2017-02-22 MED ORDER — LOVASTATIN 40 MG PO TABS: 40.0000 mg | ORAL_TABLET | Freq: Every day | ORAL | 2 refills | Status: DC

## 2017-02-22 NOTE — Telephone Encounter (Signed)
yours

## 2017-03-22 ENCOUNTER — Other Ambulatory Visit: Payer: Self-pay | Admitting: Endocrinology

## 2017-03-23 ENCOUNTER — Other Ambulatory Visit: Payer: Self-pay | Admitting: Endocrinology

## 2017-06-10 ENCOUNTER — Telehealth: Payer: Self-pay | Admitting: Endocrinology

## 2017-06-10 NOTE — Telephone Encounter (Signed)
[facility] needs clarification Hyzaar (generic is Losartan-HCTZ). Please call ph#[phone] to clarify dosage (100-25 mg is on back order). They can fill in different ways but must get permission first

## 2017-06-11 NOTE — Telephone Encounter (Signed)
I called pharmacy & gave them ok to substitute if needed. They stated that hey should have prescription back in by Monday. I tried to call patient to see if he was out but had to leave VM.

## 2017-06-11 NOTE — Telephone Encounter (Signed)
Dosage is correct, but was there a paper message about this also?

## 2017-06-11 NOTE — Telephone Encounter (Signed)
Ok to refill at same dosage.

## 2017-06-11 NOTE — Telephone Encounter (Signed)
No paper message that I have seen. [facility] to give [facility] a verbal to fill?

## 2017-07-21 ENCOUNTER — Ambulatory Visit: Payer: Self-pay | Admitting: Endocrinology

## 2017-07-23 ENCOUNTER — Other Ambulatory Visit: Payer: Self-pay | Admitting: Endocrinology

## 2017-08-23 ENCOUNTER — Other Ambulatory Visit: Payer: Self-pay | Admitting: Hematology & Oncology

## 2017-11-18 ENCOUNTER — Other Ambulatory Visit: Payer: Self-pay | Admitting: Endocrinology

## 2017-11-18 NOTE — Telephone Encounter (Signed)
Ov would be needed to consider 

## 2017-11-18 NOTE — Telephone Encounter (Signed)
Please advise on refill.

## 2017-11-22 NOTE — Telephone Encounter (Signed)
Patient coming in for f/u tomorrow afternoon.

## 2017-11-23 ENCOUNTER — Ambulatory Visit: Payer: Self-pay | Admitting: Endocrinology

## 2017-11-28 ENCOUNTER — Other Ambulatory Visit: Payer: Self-pay | Admitting: Hematology & Oncology

## 2017-12-08 ENCOUNTER — Ambulatory Visit (INDEPENDENT_AMBULATORY_CARE_PROVIDER_SITE_OTHER): Payer: Self-pay | Admitting: Endocrinology

## 2017-12-08 ENCOUNTER — Encounter: Payer: Self-pay | Admitting: Endocrinology

## 2017-12-08 VITALS — BP 136/88 | HR 76 | Wt 247.2 lb

## 2017-12-08 DIAGNOSIS — I10 Essential (primary) hypertension: Secondary | ICD-10-CM

## 2017-12-08 DIAGNOSIS — R634 Abnormal weight loss: Secondary | ICD-10-CM

## 2017-12-08 MED ORDER — LOSARTAN POTASSIUM-HCTZ 100-25 MG PO TABS: 1.0000 | ORAL_TABLET | Freq: Every day | ORAL | 3 refills | Status: DC

## 2017-12-08 MED ORDER — ALPRAZOLAM 0.25 MG PO TABS: 0.2500 mg | ORAL_TABLET | Freq: Three times a day (TID) | ORAL | 3 refills | Status: DC | PRN

## 2017-12-08 NOTE — Progress Notes (Signed)
Subjective:    Patient ID: [name], male    DOB: [birth year], 62 y.o.   MRN: [id number]  HPI He still no insurance.  He has lost weight, due to his efforts.  He takes BP meds as rx'ed Past Medical History:  Diagnosis Date  . ALLERGIC RHINITIS 03/01/2007  . Arthritis    avascular necrosis - of L shoulder, knees- painful at time s  . ASTHMA 03/01/2007  . Diabetes mellitus without complication (HCC)    , borderline , per pt. , per Dr. Everardo AllEllison  . ERECTILE DYSFUNCTION 03/01/2007  . H/O hiatal hernia   . Hx of echocardiogram    done by [place]   . HYPERGLYCEMIA 03/01/2007  . HYPERLIPIDEMIA 03/01/2007  . HYPERTENSION 03/01/2007  . SHINGLES 03/01/2007  . Shortness of breath   . Sleep apnea    sees Dr. Shelle Ironlance - sleep study- 06/2011, uses CPAP q night     Past Surgical History:  Procedure Laterality Date  . Fx Humerus     Left- 2005, required 2 surgeries   . SHOULDER HEMI-ARTHROPLASTY  04/11/2012   Procedure: SHOULDER HEMI-ARTHROPLASTY;  Surgeon: Mable ParisJustin William Chandler, MD;  [facility];  Service: Orthopedics;  Laterality: Left;  LEFT SHOULDER HEMIARTHROPLASTY   . SHOULDER SURGERY  2005   Left  . Stress Cardiolite  11/01/2001    Social History   Socioeconomic History  . Marital status: Married    Spouse name: Not on file  . Number of children: 2  . Years of education: Not on file  . Highest education level: Not on file  Occupational History  . Occupation: HydrologistCrane Operator  Social Needs  . Financial resource strain: Not on file  . Food insecurity:    Worry: Not on file    Inability: Not on file  . Transportation needs:    Medical: Not on file    Non-medical: Not on file  Tobacco Use  . Smoking status: Current Every Day Smoker    Packs/day: 0.50    Years: 31.00    Pack years: 15.50    Types: Cigarettes  . Smokeless tobacco: Former NeurosurgeonUser  . Tobacco comment: smoking less than 1/2 a day (10/08/14)  Substance and Sexual Activity  . Alcohol use: No    Alcohol/week:  0.0 oz  . Drug use: No  . Sexual activity: Not on file  Lifestyle  . Physical activity:    Days per week: Not on file    Minutes per session: Not on file  . Stress: Not on file  Relationships  . Social connections:    Talks on phone: Not on file    Gets together: Not on file    Attends religious service: Not on file    Active member of club or organization: Not on file    Attends meetings of clubs or organizations: Not on file    Relationship status: Not on file  . Intimate partner violence:    Fear of current or ex partner: Not on file    Emotionally abused: Not on file    Physically abused: Not on file    Forced sexual activity: Not on file  Other Topics Concern  . Not on file  Social History Narrative  . Not on file    Current Outpatient Medications on File Prior to Visit  Medication Sig Dispense Refill  . albuterol (PROVENTIL HFA;VENTOLIN HFA) 108 (90 Base) MCG/ACT inhaler Inhale 1-2 puffs into the lungs every 6 (six) hours as needed  for wheezing or shortness of breath. (Patient not taking: Reported on 07/14/2016) 1 Inhaler 0  . lovastatin (MEVACOR) 40 MG tablet Take 1 tablet (40 mg total) by mouth daily with breakfast. 30 tablet 2   No current facility-administered medications on file prior to visit.     No Known Allergies  Family History  Problem Relation Age of Onset  . Cancer Neg Hx     BP 136/88 (BP Location: Left Arm, Patient Position: Sitting, Cuff Size: Normal)   Pulse 76   Wt 247 lb 3.2 oz (112.1 kg)   SpO2 95%   BMI 30.90 kg/m    Review of Systems Anxiety is well-controlled    Objective:   Physical Exam VITAL SIGNS:  See vs page GENERAL: no distress. LUNGS:  Clear to auscultation HEART:  Regular rate and rhythm without murmurs noted. Normal S1,S2.        Assessment & Plan:  HTN: he needs increased rx.  I have sent a prescription to your pharmacy. Weight loss: we should exclude DM and hyperthyroidism.   (no AVS, due to computer problem)

## 2018-01-25 ENCOUNTER — Emergency Department (HOSPITAL_COMMUNITY)
Admission: EM | Admit: 2018-01-25 | Discharge: 2018-01-25 | Disposition: A | Discharge status: Home / Self Care | Payer: Self-pay | Attending: Emergency Medicine | Admitting: Emergency Medicine

## 2018-01-25 ENCOUNTER — Other Ambulatory Visit: Payer: Self-pay

## 2018-01-25 ENCOUNTER — Encounter (HOSPITAL_COMMUNITY): Payer: Self-pay

## 2018-01-25 ENCOUNTER — Emergency Department (HOSPITAL_COMMUNITY): Payer: Self-pay

## 2018-01-25 DIAGNOSIS — F17210 Nicotine dependence, cigarettes, uncomplicated: Secondary | ICD-10-CM | POA: Insufficient documentation

## 2018-01-25 DIAGNOSIS — I10 Essential (primary) hypertension: Secondary | ICD-10-CM | POA: Insufficient documentation

## 2018-01-25 DIAGNOSIS — F419 Anxiety disorder, unspecified: Secondary | ICD-10-CM | POA: Insufficient documentation

## 2018-01-25 DIAGNOSIS — R109 Unspecified abdominal pain: Secondary | ICD-10-CM

## 2018-01-25 DIAGNOSIS — Z79899 Other long term (current) drug therapy: Secondary | ICD-10-CM | POA: Insufficient documentation

## 2018-01-25 DIAGNOSIS — Z96612 Presence of left artificial shoulder joint: Secondary | ICD-10-CM | POA: Insufficient documentation

## 2018-01-25 DIAGNOSIS — K429 Umbilical hernia without obstruction or gangrene: Secondary | ICD-10-CM | POA: Insufficient documentation

## 2018-01-25 DIAGNOSIS — J45909 Unspecified asthma, uncomplicated: Secondary | ICD-10-CM | POA: Insufficient documentation

## 2018-01-25 LAB — COMPREHENSIVE METABOLIC PANEL
ALK PHOS: 49 U/L (ref 38–126)
ALT: 17 U/L (ref 0–44)
AST: 15 U/L (ref 15–41)
Albumin: 3.6 g/dL (ref 3.5–5.0)
Anion gap: 7 (ref 5–15)
BUN: 14 mg/dL (ref 8–23)
CALCIUM: 8.5 mg/dL — AB (ref 8.9–10.3)
CO2: 27 mmol/L (ref 22–32)
Chloride: 101 mmol/L (ref 98–111)
Creatinine, Ser: 0.78 mg/dL (ref 0.61–1.24)
Glucose, Bld: 110 mg/dL — ABNORMAL HIGH (ref 70–99)
Potassium: 3.4 mmol/L — ABNORMAL LOW (ref 3.5–5.1)
SODIUM: 135 mmol/L (ref 135–145)
Total Bilirubin: 0.6 mg/dL (ref 0.3–1.2)
Total Protein: 7.1 g/dL (ref 6.5–8.1)

## 2018-01-25 LAB — CBC
HEMATOCRIT: 47.8 % (ref 39.0–52.0)
HEMOGLOBIN: 16 g/dL (ref 13.0–17.0)
MCH: 30.1 pg (ref 26.0–34.0)
MCHC: 33.5 g/dL (ref 30.0–36.0)
MCV: 89.8 fL (ref 78.0–100.0)
Platelets: 148 10*3/uL — ABNORMAL LOW (ref 150–400)
RBC: 5.32 MIL/uL (ref 4.22–5.81)
RDW: 13.2 % (ref 11.5–15.5)
WBC: 10.3 10*3/uL (ref 4.0–10.5)

## 2018-01-25 LAB — LIPASE, BLOOD: Lipase: 25 U/L (ref 11–51)

## 2018-01-25 MED ORDER — IOPAMIDOL (ISOVUE-300) INJECTION 61%
100.0000 mL | Freq: Once | INTRAVENOUS | Status: AC | PRN
  Administered 2018-01-25: 100 mL via INTRAVENOUS

## 2018-01-25 NOTE — Discharge Instructions (Addendum)
See a clinician if you develop persistent pain, vomiting, blood in the stools or other concerns. Discussed treatment options with local surgeon.

## 2018-01-25 NOTE — ED Triage Notes (Signed)
Pt reports abd pain from an abdominal hernia that he has had for a couple of years. Pt says his belly button doesn't usually stick out, but Sunday it started protruding out and he started having pain on Sunday after lifting something heavy on Saturday. Pt reports normal BM's.

## 2018-01-25 NOTE — ED Provider Notes (Signed)
[facility] EMERGENCY DEPARTMENT Provider Note   CSN: [id number] Arrival date & time: 01/25/18  0557     History   Chief Complaint Chief Complaint  Patient presents with  . Abdominal Pain    hernia    HPI [name] is a 62 y.o. male.  Patient presents with abdominal discomfort periumbilical since Saturday.  Patient was doing some lifting and since then has had a burning ache sensation centrally.  Patient got worried after he spoke with a nurse and said he might have incarcerated hernia.  Patient does have a history of obesity, high blood pressure, abdominal wall hernia.  Patient has never had surgery on his hernia.  Patient has no vomiting or diarrhea, no blood in the stools, patient is passing gas normal.  No significant pain currently.     Past Medical History:  Diagnosis Date  . ALLERGIC RHINITIS 03/01/2007  . Arthritis    avascular necrosis - of L shoulder, knees- painful at time s  . ASTHMA 03/01/2007  . Diabetes mellitus without complication (HCC)    , borderline , per pt. , per Dr. [name]  . ERECTILE DYSFUNCTION 03/01/2007  . H/O hiatal hernia   . Hx of echocardiogram    done by [facility]   . HYPERGLYCEMIA 03/01/2007  . HYPERLIPIDEMIA 03/01/2007  . HYPERTENSION 03/01/2007  . SHINGLES 03/01/2007  . Shortness of breath   . Sleep apnea    sees Dr. Shelle Iron - sleep study- 06/2011, uses CPAP q night     Patient Active Problem List   Diagnosis Date Noted  . Weight loss 12/08/2017  . Carotid artery calcification 12/03/2015  . Screening for prostate cancer 04/01/2015  . Pain in joint, ankle and foot 03/30/2014  . Wellness examination 03/30/2014  . Left shoulder pain 04/12/2012  . Psoriasis 03/25/2012  . Dyspnea 02/09/2012  . Abnormal x-ray of lung 02/09/2012  . OSA (obstructive sleep apnea) 06/22/2011  . Anxiety state 03/29/2011  . Routine general medical examination at a health care facility 03/26/2011  . COUGH 03/24/2010  . SKIN RASH 03/21/2009  . SHOULDER  PAIN, LEFT 01/23/2008  . SHINGLES 03/01/2007  . HYPERLIPIDEMIA 03/01/2007  . ERECTILE DYSFUNCTION 03/01/2007  . SMOKER 03/01/2007  . HTN (hypertension) 03/01/2007  . ALLERGIC RHINITIS 03/01/2007  . ASTHMA 03/01/2007  . HYPERGLYCEMIA 03/01/2007    Past Surgical History:  Procedure Laterality Date  . Fx Humerus     Left- 2005, required 2 surgeries   . SHOULDER HEMI-ARTHROPLASTY  04/11/2012   Procedure: SHOULDER HEMI-ARTHROPLASTY;  Surgeon: [name], MD;  Location: [facility] OR;  Service: Orthopedics;  Laterality: Left;  LEFT SHOULDER HEMIARTHROPLASTY   . SHOULDER SURGERY  2005   Left  . Stress Cardiolite  11/01/2001        Home Medications    Prior to Admission medications   Medication Sig Start Date End Date Taking? Authorizing Provider  albuterol (PROVENTIL HFA;VENTOLIN HFA) 108 (90 Base) MCG/ACT inhaler Inhale 1-2 puffs into the lungs every 6 (six) hours as needed for wheezing or shortness of breath. Patient not taking: Reported on 07/14/2016 12/03/15   Romero Belling, MD  ALPRAZolam Prudy Feeler) 0.25 MG tablet Take 1 tablet (0.25 mg total) by mouth 3 (three) times daily as needed. for anxiety 12/08/17   Romero Belling, MD  losartan-hydrochlorothiazide (HYZAAR) 100-25 MG tablet Take 1 tablet by mouth daily. 12/08/17   Romero Belling, MD  lovastatin (MEVACOR) 40 MG tablet Take 1 tablet (40 mg total) by mouth daily with breakfast. 06/20/15  Romero Belling, MD    Family History Family History  Problem Relation Age of Onset  . Cancer Neg Hx     Social History Social History   Tobacco Use  . Smoking status: Current Every Day Smoker    Packs/day: 0.50    Years: 31.00    Pack years: 15.50    Types: Cigarettes  . Smokeless tobacco: Former Neurosurgeon  . Tobacco comment: smoking less than 1/2 a day (10/08/14)  Substance Use Topics  . Alcohol use: No    Alcohol/week: 0.0 standard drinks  . Drug use: No     Allergies   Patient has no known allergies.   Review of  Systems Review of Systems  Constitutional: Negative for chills and fever.  HENT: Negative for congestion.   Eyes: Negative for visual disturbance.  Respiratory: Negative for shortness of breath.   Cardiovascular: Negative for chest pain.  Gastrointestinal: Positive for abdominal pain. Negative for vomiting.  Genitourinary: Negative for dysuria and flank pain.  Musculoskeletal: Negative for back pain, neck pain and neck stiffness.  Skin: Negative for rash.  Neurological: Negative for light-headedness and headaches.     Physical Exam Updated Vital Signs BP 120/82   Pulse 63   Temp 98 F (36.7 C) (Oral)   Resp 17   Ht 6\' 4"  (1.93 m)   Wt 133.8 kg   SpO2 97%   BMI 35.91 kg/m   Physical Exam  Constitutional: He is oriented to person, place, and time. He appears well-developed and well-nourished.  HENT:  Head: Normocephalic and atraumatic.  Eyes: Conjunctivae are normal. Right eye exhibits no discharge. Left eye exhibits no discharge.  Neck: Normal range of motion. Neck supple. No tracheal deviation present.  Cardiovascular: Normal rate and regular rhythm.  Pulmonary/Chest: Effort normal and breath sounds normal.  Abdominal: Soft. He exhibits no distension. There is tenderness (Minimal supraumbilical, umbilical hernia palpated, no discoloration). There is no guarding.  Musculoskeletal: He exhibits no edema.  Neurological: He is alert and oriented to person, place, and time.  Skin: Skin is warm. No rash noted.  Psychiatric: He has a normal mood and affect.  Nursing note and vitals reviewed.    ED Treatments / Results  Labs (all labs ordered are listed, but only abnormal results are displayed) Labs Reviewed  COMPREHENSIVE METABOLIC PANEL - Abnormal; Notable for the following components:      Result Value   Potassium 3.4 (*)    Glucose, Bld 110 (*)    Calcium 8.5 (*)    All other components within normal limits  CBC - Abnormal; Notable for the following components:    Platelets 148 (*)    All other components within normal limits  LIPASE, BLOOD    EKG None  Radiology Ct Abdomen Pelvis W Contrast  Result Date: 01/25/2018 CLINICAL DATA:  Umbilical hernia.  Umbilical abdominal pain. EXAM: CT ABDOMEN AND PELVIS WITH CONTRAST TECHNIQUE: Multidetector CT imaging of the abdomen and pelvis was performed using the standard protocol following bolus administration of intravenous contrast. CONTRAST:  ISOVUE-300 IOPAMIDOL (ISOVUE-300) INJECTION 61% COMPARISON:  07/20/2001 CT abdomen/pelvis. FINDINGS: Lower chest: No significant pulmonary nodules or acute consolidative airspace disease. Coronary atherosclerosis. Hepatobiliary: Normal liver size. No liver mass. Normal gallbladder with no radiopaque cholelithiasis. No biliary ductal dilatation. Pancreas: Normal, with no mass or duct dilation. Spleen: Normal size. No mass. Adrenals/Urinary Tract: Normal adrenals. Normal kidneys with no hydronephrosis and no renal mass. Normal bladder. Stomach/Bowel: Normal non-distended stomach. Normal caliber small bowel with  no small bowel wall thickening. Normal appendix. Mild left colonic diverticulosis, with no large bowel wall thickening or significant acute pericolonic fat stranding. Vascular/Lymphatic: Atherosclerotic nonaneurysmal abdominal aorta. Patent portal, splenic and renal veins. No pathologically enlarged lymph nodes in the abdomen or pelvis. Reproductive: Mild prostatomegaly. Other: No pneumoperitoneum, ascites or focal fluid collection. Moderate fat containing umbilical hernia with hernia neck diameter 1.2 cm, new since 2003 CT. Musculoskeletal: No aggressive appearing focal osseous lesions. Mild thoracolumbar spondylosis. IMPRESSION: 1. Moderate fat containing umbilical hernia. 2. No evidence of bowel obstruction or acute bowel inflammation. Normal appendix. Mild left colonic diverticulosis, with no evidence of acute diverticulitis. 3. Mildly enlarged prostate. 4.  Aortic  Atherosclerosis (ICD10-I70.0).  Coronary atherosclerosis. Electronically Signed   By: Delbert Phenix M.D.   On: 01/25/2018 09:14    Procedures Procedures (including critical care time)  Medications Ordered in ED Medications  iopamidol (ISOVUE-300) 61 % injection 100 mL (100 mLs Intravenous Contrast Given 01/25/18 0847)     Initial Impression / Assessment and Plan / ED Course  I have reviewed the triage vital signs and the nursing notes.  Pertinent labs & imaging results that were available during my care of the patient were reviewed by me and considered in my medical decision making (see chart for details).    Overall well-appearing patient presents with mild central abdominal discomfort and hernia history.  Patient does have umbilical hernia however he said he has had that for some time and it always protrudes.  With mild persistent symptoms CT scan ordered for further delineation to ensure no significant signs of incarceration/strangulation.    Patient with no abdominal pain on reassessment.  CT scan showed mild fat in the hernia without any acute abnormalities.  Patient stable for follow-up outpatient with general surgeon.  Results and differential diagnosis were discussed with the patient/parent/guardian. Xrays were independently reviewed by myself.  Close follow up outpatient was discussed, comfortable with the plan.   Medications  iopamidol (ISOVUE-300) 61 % injection 100 mL (100 mLs Intravenous Contrast Given 01/25/18 0847)    Vitals:   01/25/18 0609 01/25/18 0800 01/25/18 0900 01/25/18 0930  BP:  (!) 110/57 106/69 120/82  Pulse:  69 69 63  Resp:      Temp:      TempSrc:      SpO2:  96% 98% 97%  Weight: 133.8 kg     Height: 6\' 4"  (1.93 m)       Final diagnoses:  Umbilical hernia without obstruction and without gangrene  Abdominal discomfort     Final Clinical Impressions(s) / ED Diagnoses   Final diagnoses:  Umbilical hernia without obstruction and without gangrene   Abdominal discomfort    ED Discharge Orders    None       Blane Ohara, MD 01/25/18 1017

## 2018-02-14 ENCOUNTER — Other Ambulatory Visit: Payer: Self-pay | Admitting: Hematology & Oncology

## 2018-03-04 ENCOUNTER — Other Ambulatory Visit: Payer: Self-pay | Admitting: Hematology & Oncology

## 2018-03-07 ENCOUNTER — Other Ambulatory Visit: Payer: Self-pay

## 2018-03-07 MED ORDER — LOVASTATIN 40 MG PO TABS: 40.0000 mg | ORAL_TABLET | Freq: Every day | ORAL | 2 refills | Status: DC

## 2018-03-15 ENCOUNTER — Other Ambulatory Visit: Payer: Self-pay

## 2018-03-15 MED ORDER — ALPRAZOLAM 0.25 MG PO TABS: 0.2500 mg | ORAL_TABLET | Freq: Three times a day (TID) | ORAL | 1 refills | Status: DC | PRN

## 2018-03-15 NOTE — Telephone Encounter (Signed)
Pt is requesting a refill of this medication. States it is his second request. Please send to [facility]. Let me know once this has been done and I will call him to make him aware.  Thanks, Ruhi Kopke

## 2018-03-15 NOTE — Telephone Encounter (Signed)
Dr. [name] refilled Rx. Called pt as requested to make him aware. LVM requesting returned call.

## 2018-03-15 NOTE — Telephone Encounter (Signed)
OK, I have sent a prescription to your pharmacy.  

## 2018-03-15 NOTE — Telephone Encounter (Signed)
Patient requesting refill for xanax to go to [facility] as soon as possible- patient stated this is his second request and wants to know why this has not been filled yet- patient would like a call back when this has beendone

## 2018-03-16 ENCOUNTER — Telehealth: Payer: Self-pay | Admitting: Endocrinology

## 2018-03-16 NOTE — Telephone Encounter (Signed)
Per [facility] "Caller states he had a call earlier today from Amy and he is returning her call."

## 2018-03-16 NOTE — Telephone Encounter (Signed)
Second attempt to return pt call to inform him that his Rx has been sent successfully. LVM.

## 2018-03-23 ENCOUNTER — Other Ambulatory Visit: Payer: Self-pay | Admitting: Endocrinology

## 2018-03-23 MED ORDER — HYDROCHLOROTHIAZIDE 25 MG PO TABS: 25.0000 mg | ORAL_TABLET | Freq: Every day | ORAL | 3 refills | Status: DC

## 2018-03-23 MED ORDER — LOSARTAN POTASSIUM 100 MG PO TABS: 100.0000 mg | ORAL_TABLET | Freq: Every day | ORAL | 3 refills | Status: AC

## 2018-04-11 ENCOUNTER — Telehealth: Payer: Self-pay | Admitting: Endocrinology

## 2018-04-11 ENCOUNTER — Telehealth: Payer: Self-pay

## 2018-04-11 NOTE — Telephone Encounter (Signed)
Per Dr. Everardo AllEllison, there is one refill remaining. Future refills need to come from PCP. Faxed to [facility] with confirmation received.

## 2018-04-11 NOTE — Telephone Encounter (Signed)
This has been addressed in a previously created encounter. This encounter has been closed as duplicate. 

## 2018-04-11 NOTE — Telephone Encounter (Signed)
Patient has moved to [place] (no longer lives in [place]). Patient requests that his Pharmacy (preferred)  be changed to [facility] in [place], [place]. Patient requests RX for Xanax sent to the above mentioned new Pharmacy.

## 2018-04-11 NOTE — Telephone Encounter (Signed)
Received documentation from [facility], [place] requesting refill of Xanax at pt request. Document has been placed on Dr. George HughEllison's desk for review and orders. Will await his response re: refill request.

## 2018-05-30 ENCOUNTER — Other Ambulatory Visit: Payer: Self-pay | Admitting: Endocrinology

## 2018-06-07 ENCOUNTER — Telehealth: Payer: Self-pay | Admitting: Endocrinology

## 2018-06-07 ENCOUNTER — Other Ambulatory Visit: Payer: Self-pay | Admitting: Endocrinology

## 2018-06-07 ENCOUNTER — Other Ambulatory Visit: Payer: Self-pay

## 2018-06-07 MED ORDER — LOVASTATIN 40 MG PO TABS: 40.0000 mg | ORAL_TABLET | Freq: Every day | ORAL | 0 refills | Status: AC

## 2018-06-07 NOTE — Telephone Encounter (Signed)
Called pt and informed of Dr. George Hugh recommendation. Pt became angry stating he was never informed of this change. Reminded pt of letter mailed 11/22/17. States he never got that letter. Pt advised he was given enough medication supply to get him through until he found a new provider. Unfortunately, Dr. [name] can no longer provide primary care services. Again reminded to establish care with a PCP to ensure there is no lapse in care. Pt then hung up on me.

## 2018-06-07 NOTE — Telephone Encounter (Signed)
Patient wanted to inform us he is now using [facility] - [place],  - [place], [place] - [address] States they will be contacting lovastatin and xanax.  Thanks

## 2018-06-07 NOTE — Telephone Encounter (Signed)
Please advise pt to cancel appt for next month, and to get set up with new PCP.

## 2018-06-07 NOTE — Telephone Encounter (Signed)
Spoke to [name] and he stated that he was not informed that Dr. [name] no longer does primary care(due to a move) and that he has an appt on 06/16/2018 and that he needs his medication. Please advise

## 2018-06-16 ENCOUNTER — Ambulatory Visit: Payer: Self-pay | Admitting: Endocrinology

## 2020-06-12 ENCOUNTER — Other Ambulatory Visit: Payer: Self-pay | Admitting: Family Medicine

## 2020-06-12 DIAGNOSIS — K429 Umbilical hernia without obstruction or gangrene: Secondary | ICD-10-CM

## 2020-06-25 ENCOUNTER — Ambulatory Visit: Payer: Self-pay | Admitting: General Surgery

## 2020-07-04 ENCOUNTER — Ambulatory Visit: Payer: Self-pay | Admitting: General Surgery

## 2020-07-09 ENCOUNTER — Ambulatory Visit: Payer: Self-pay | Admitting: General Surgery

## 2020-07-16 ENCOUNTER — Ambulatory Visit: Payer: Self-pay | Admitting: General Surgery

## 2020-07-25 ENCOUNTER — Ambulatory Visit (INDEPENDENT_AMBULATORY_CARE_PROVIDER_SITE_OTHER): Payer: 59 | Admitting: General Surgery

## 2020-07-25 ENCOUNTER — Other Ambulatory Visit: Payer: Self-pay

## 2020-07-25 ENCOUNTER — Encounter: Payer: Self-pay | Admitting: General Surgery

## 2020-07-25 VITALS — BP 133/74 | HR 85 | Temp 98.1°F | Resp 16 | Ht 75.5 in | Wt 370.0 lb

## 2020-07-25 DIAGNOSIS — K429 Umbilical hernia without obstruction or gangrene: Secondary | ICD-10-CM

## 2020-07-25 NOTE — Patient Instructions (Signed)

## 2020-07-26 NOTE — Progress Notes (Signed)
[name]; [id number]; April 07, 1956   HPI Patient is a 65 year old white male who was referred to my care by [name] for evaluation and treatment of an umbilical hernia.  Patient states he has had an umbilical hernia for many years.  Recently, in it has increased in size.  He states he has noted some bloody drainage emanating from the skin overlying the umbilicus.  He denies any nausea or vomiting.  He has had some periumbilical pain especially when straining. Past Medical History:  Diagnosis Date  . ALLERGIC RHINITIS 03/01/2007  . Arthritis    avascular necrosis - of L shoulder, knees- painful at time s  . ASTHMA 03/01/2007  . Diabetes mellitus without complication (HCC)    , borderline , per pt. , per Dr. [name]  . ERECTILE DYSFUNCTION 03/01/2007  . H/O hiatal hernia   . Hx of echocardiogram    done by [facility]   . HYPERGLYCEMIA 03/01/2007  . HYPERLIPIDEMIA 03/01/2007  . HYPERTENSION 03/01/2007  . SHINGLES 03/01/2007  . Shortness of breath   . Sleep apnea    sees Dr. Shelle Iron - sleep study- 06/2011, uses CPAP q night     Past Surgical History:  Procedure Laterality Date  . Fx Humerus     Left- 2005, required 2 surgeries   . SHOULDER HEMI-ARTHROPLASTY  04/11/2012   Procedure: SHOULDER HEMI-ARTHROPLASTY;  Surgeon: [name], MD;  Location: [facility] OR;  Service: Orthopedics;  Laterality: Left;  LEFT SHOULDER HEMIARTHROPLASTY   . SHOULDER SURGERY  2005   Left  . Stress Cardiolite  11/01/2001    Family History  Problem Relation Age of Onset  . Cancer Neg Hx     Current Outpatient Medications on File Prior to Visit  Medication Sig Dispense Refill  . albuterol (PROVENTIL HFA;VENTOLIN HFA) 108 (90 Base) MCG/ACT inhaler INHALE ONE TO TWO PUFFS BY MOUTH EVERY 6 HOURS AS NEEDED FOR WHEEZING OR SHORTNESS OF BREATH 18 g 0  . ALPRAZolam (XANAX) 0.25 MG tablet TAKE ONE TABLET BY MOUTH THREE TIMES DAILY AS NEEDED FOR ANXIETY. 90 tablet 0  . amLODipine (NORVASC) 5 MG tablet  Take 5 mg by mouth daily.    . busPIRone (BUSPAR) 10 MG tablet Take 10 mg by mouth 3 (three) times daily.    Marland Kitchen HYDROcodone-acetaminophen (NORCO) 10-325 MG tablet Take 1 tablet by mouth every 8 (eight) hours as needed.    Marland Kitchen losartan (COZAAR) 100 MG tablet Take 1 tablet (100 mg total) by mouth daily. 90 tablet 3  . lovastatin (MEVACOR) 40 MG tablet Take 1 tablet (40 mg total) by mouth daily with breakfast. 30 tablet 0  . torsemide (DEMADEX) 20 MG tablet Take 20 mg by mouth 2 (two) times daily.     No current facility-administered medications on file prior to visit.    No Known Allergies  Social History   Substance and Sexual Activity  Alcohol Use No  . Alcohol/week: 0.0 standard drinks    Social History   Tobacco Use  Smoking Status Current Every Day Smoker  . Packs/day: 0.50  . Years: 31.00  . Pack years: 15.50  . Types: Cigarettes  Smokeless Tobacco Former User  Tobacco Comment   smoking less than 1/2 a day (10/08/14)    Review of Systems  Constitutional: Negative.   HENT: Negative.   Eyes: Negative.   Respiratory: Positive for cough, shortness of breath and wheezing.   Cardiovascular: Negative.   Gastrointestinal: Positive for abdominal pain.  Genitourinary: Negative.   Musculoskeletal:  Negative.   Skin: Negative.   Neurological: Negative.   Endo/Heme/Allergies: Negative.   Psychiatric/Behavioral: Negative.     Objective   Vitals:   07/25/20 1106  BP: 133/74  Pulse: 85  Resp: 16  Temp: 98.1 F (36.7 C)  SpO2: 92%    Physical Exam Vitals reviewed.  Constitutional:      Appearance: Normal appearance. He is obese. He is not ill-appearing.  HENT:     Head: Normocephalic and atraumatic.  Cardiovascular:     Rate and Rhythm: Normal rate and regular rhythm.     Heart sounds: Normal heart sounds. No murmur heard. No friction rub. No gallop.   Pulmonary:     Effort: Pulmonary effort is normal. No respiratory distress.     Breath sounds: Normal breath  sounds. No stridor. No wheezing, rhonchi or rales.  Abdominal:     General: Bowel sounds are normal. There is no distension.     Palpations: Abdomen is soft. There is no mass.     Tenderness: There is no abdominal tenderness. There is no guarding or rebound.     Hernia: A hernia is present.     Comments: Large umbilical hernia with thin irritated skin overlying it.  No frank ulcerations were seen, but there is some skin breakdown along the inferior aspect.  Skin:    General: Skin is warm and dry.  Neurological:     Mental Status: He is alert and oriented to person, place, and time.   Primary care notes reviewed  Assessment  Umbilical hernia Plan   Patient is scheduled for an umbilical herniorrhaphy with mesh on 08/09/2020.  The risks and benefits of the procedure including bleeding, infection, recurrence of the hernia, and mesh use were fully explained to the patient, who gave informed consent.  I did tell the patient that I may have to resect some of the skin due to its friable nature.  He understands this.  This may lead to slower wound healing.  

## 2020-07-26 NOTE — H&P (Signed)
[name]; [id number]; April 07, 1956   HPI Patient is a 65 year old white male who was referred to my care by [name] for evaluation and treatment of an umbilical hernia.  Patient states he has had an umbilical hernia for many years.  Recently, in it has increased in size.  He states he has noted some bloody drainage emanating from the skin overlying the umbilicus.  He denies any nausea or vomiting.  He has had some periumbilical pain especially when straining. Past Medical History:  Diagnosis Date  . ALLERGIC RHINITIS 03/01/2007  . Arthritis    avascular necrosis - of L shoulder, knees- painful at time s  . ASTHMA 03/01/2007  . Diabetes mellitus without complication (HCC)    , borderline , per pt. , per Dr. [name]  . ERECTILE DYSFUNCTION 03/01/2007  . H/O hiatal hernia   . Hx of echocardiogram    done by [facility]   . HYPERGLYCEMIA 03/01/2007  . HYPERLIPIDEMIA 03/01/2007  . HYPERTENSION 03/01/2007  . SHINGLES 03/01/2007  . Shortness of breath   . Sleep apnea    sees Dr. Shelle Iron - sleep study- 06/2011, uses CPAP q night     Past Surgical History:  Procedure Laterality Date  . Fx Humerus     Left- 2005, required 2 surgeries   . SHOULDER HEMI-ARTHROPLASTY  04/11/2012   Procedure: SHOULDER HEMI-ARTHROPLASTY;  Surgeon: [name], MD;  Location: [facility] OR;  Service: Orthopedics;  Laterality: Left;  LEFT SHOULDER HEMIARTHROPLASTY   . SHOULDER SURGERY  2005   Left  . Stress Cardiolite  11/01/2001    Family History  Problem Relation Age of Onset  . Cancer Neg Hx     Current Outpatient Medications on File Prior to Visit  Medication Sig Dispense Refill  . albuterol (PROVENTIL HFA;VENTOLIN HFA) 108 (90 Base) MCG/ACT inhaler INHALE ONE TO TWO PUFFS BY MOUTH EVERY 6 HOURS AS NEEDED FOR WHEEZING OR SHORTNESS OF BREATH 18 g 0  . ALPRAZolam (XANAX) 0.25 MG tablet TAKE ONE TABLET BY MOUTH THREE TIMES DAILY AS NEEDED FOR ANXIETY. 90 tablet 0  . amLODipine (NORVASC) 5 MG tablet  Take 5 mg by mouth daily.    . busPIRone (BUSPAR) 10 MG tablet Take 10 mg by mouth 3 (three) times daily.    Marland Kitchen HYDROcodone-acetaminophen (NORCO) 10-325 MG tablet Take 1 tablet by mouth every 8 (eight) hours as needed.    Marland Kitchen losartan (COZAAR) 100 MG tablet Take 1 tablet (100 mg total) by mouth daily. 90 tablet 3  . lovastatin (MEVACOR) 40 MG tablet Take 1 tablet (40 mg total) by mouth daily with breakfast. 30 tablet 0  . torsemide (DEMADEX) 20 MG tablet Take 20 mg by mouth 2 (two) times daily.     No current facility-administered medications on file prior to visit.    No Known Allergies  Social History   Substance and Sexual Activity  Alcohol Use No  . Alcohol/week: 0.0 standard drinks    Social History   Tobacco Use  Smoking Status Current Every Day Smoker  . Packs/day: 0.50  . Years: 31.00  . Pack years: 15.50  . Types: Cigarettes  Smokeless Tobacco Former User  Tobacco Comment   smoking less than 1/2 a day (10/08/14)    Review of Systems  Constitutional: Negative.   HENT: Negative.   Eyes: Negative.   Respiratory: Positive for cough, shortness of breath and wheezing.   Cardiovascular: Negative.   Gastrointestinal: Positive for abdominal pain.  Genitourinary: Negative.   Musculoskeletal:  Negative.   Skin: Negative.   Neurological: Negative.   Endo/Heme/Allergies: Negative.   Psychiatric/Behavioral: Negative.     Objective   Vitals:   07/25/20 1106  BP: 133/74  Pulse: 85  Resp: 16  Temp: 98.1 F (36.7 C)  SpO2: 92%    Physical Exam Vitals reviewed.  Constitutional:      Appearance: Normal appearance. He is obese. He is not ill-appearing.  HENT:     Head: Normocephalic and atraumatic.  Cardiovascular:     Rate and Rhythm: Normal rate and regular rhythm.     Heart sounds: Normal heart sounds. No murmur heard. No friction rub. No gallop.   Pulmonary:     Effort: Pulmonary effort is normal. No respiratory distress.     Breath sounds: Normal breath  sounds. No stridor. No wheezing, rhonchi or rales.  Abdominal:     General: Bowel sounds are normal. There is no distension.     Palpations: Abdomen is soft. There is no mass.     Tenderness: There is no abdominal tenderness. There is no guarding or rebound.     Hernia: A hernia is present.     Comments: Large umbilical hernia with thin irritated skin overlying it.  No frank ulcerations were seen, but there is some skin breakdown along the inferior aspect.  Skin:    General: Skin is warm and dry.  Neurological:     Mental Status: He is alert and oriented to person, place, and time.   Primary care notes reviewed  Assessment  Umbilical hernia Plan   Patient is scheduled for an umbilical herniorrhaphy with mesh on 08/09/2020.  The risks and benefits of the procedure including bleeding, infection, recurrence of the hernia, and mesh use were fully explained to the patient, who gave informed consent.  I did tell the patient that I may have to resect some of the skin due to its friable nature.  He understands this.  This may lead to slower wound healing.

## 2020-08-07 NOTE — Patient Instructions (Addendum)
Umbilical Hernia, Adult  A hernia is a bulge of tissue that pushes through an opening between muscles. An umbilical hernia happens in the abdomen, near the belly button (umbilicus). The hernia may contain tissues from the small intestine, large intestine, or fatty tissue covering the intestines (omentum). Umbilical hernias in adults tend to get worse over time, and they require surgical treatment. There are several types of umbilical hernias. You may have:  A hernia located just above or below the umbilicus (indirect hernia). This is the most common type of umbilical hernia in adults.  A hernia that forms through an opening formed by the umbilicus (direct hernia).  A hernia that comes and goes (reducible hernia). A reducible hernia may be visible only when you strain, lift something heavy, or cough. This type of hernia can be pushed back into the abdomen (reduced).  A hernia that traps abdominal tissue inside the hernia (incarcerated hernia). This type of hernia cannot be reduced.  A hernia that cuts off blood flow to the tissues inside the hernia (strangulated hernia). The tissues can start to die if this happens. This type of hernia requires emergency treatment. What are the causes? An umbilical hernia happens when tissue inside the abdomen presses on a weak area of the abdominal muscles. What increases the risk? You may have a greater risk of this condition if you:  Are obese.  Have had several pregnancies.  Have a buildup of fluid inside your abdomen (ascites).  Have had surgery that weakens the abdominal muscles. What are the signs or symptoms? The main symptom of this condition is a painless bulge at or near the belly button. A reducible hernia may be visible only when you strain, lift something heavy, or cough. Other symptoms may include:  Dull pain.  A feeling of pressure. Symptoms of a strangulated hernia may include:  Pain that gets increasingly worse.  Nausea and  vomiting.  Pain when pressing on the hernia.  Skin over the hernia becoming red or purple.  Constipation.  Blood in the stool. How is this diagnosed? This condition may be diagnosed based on:  A physical exam. You may be asked to cough or strain while standing. These actions increase the pressure inside your abdomen and force the hernia through the opening in your muscles. Your health care provider may try to reduce the hernia by pressing on it.  Your symptoms and medical history. How is this treated? Surgery is the only treatment for an umbilical hernia. Surgery for a strangulated hernia is done as soon as possible. If you have a small hernia that is not incarcerated, you may need to lose weight before having surgery. Follow these instructions at home:  Lose weight, if told by your health care provider.  Do not try to push the hernia back in.  Watch your hernia for any changes in color or size. Tell your health care provider if any changes occur.  You may need to avoid activities that increase pressure on your hernia.  Do not lift anything that is heavier than 10 lb (4.5 kg) until your health care provider says that this is safe.  Take over-the-counter and prescription medicines only as told by your health care provider.  Keep all follow-up visits as told by your health care provider. This is important. Contact a health care provider if:  Your hernia gets larger.  Your hernia becomes painful. Get help right away if:  You develop sudden, severe pain near the area of your hernia.  You have pain as well as nausea or vomiting.  You have pain and the skin over your hernia changes color.  You develop a fever. This information is not intended to replace advice given to you by your health care provider. Make sure you discuss any questions you have with your health care provider. Document Revised: 06/23/2017 Document Reviewed: 11/09/2016 Elsevier Patient Education  2021  [employer] Inc[employer]    [name]  08/07/2020     @   Your procedure is scheduled on Friday, 08/09/20.  Report to [facility] at 0940 A.M.  Call this number if you have problems the morning of surgery:  [phone]   Remember:  Do not eat or drink after midnight.      Take these medicines the morning of surgery with A SIP OF WATER amlodipine, buspar,  norco and xanax if needed. Please use your inhaler and bring it with you.    Do not wear jewelry, make-up or nail polish.  Do not wear lotions, powders, or perfumes, or deodorant.  Do not shave 48 hours prior to surgery.  Men may shave face and neck.  Do not bring valuables to the hospital.  [facility] is not responsible for any belongings or valuables.  Contacts, dentures or bridgework may not be worn into surgery.  Leave your suitcase in the car.  After surgery it may be brought to your room.  For patients admitted to the hospital, discharge time will be determined by your treatment team.  Patients discharged the day of surgery will not be allowed to drive home.   Name and phone number of your driver:   family Special instructions:  Use CHG presurgical scrub  Please read over the following fact sheets that you were given. Coughing and Deep Breathing, Anesthesia Post-op Instructions and Care and Recovery After Surgery       How to Use Chlorhexidine for Bathing Chlorhexidine gluconate (CHG) is a germ-killing (antiseptic) solution that is used to clean the skin. It can get rid of the bacteria that normally live on the skin and can keep them away for about 24 hours. To clean your skin with CHG, you may be given:  A CHG solution to use in the shower or as part of a sponge bath.  A prepackaged cloth that contains CHG. Cleaning your skin with CHG may help lower the risk for infection:  While you are staying in the intensive care unit of the hospital.  If you have a vascular access, such as a central line,  to provide short-term or long-term access to your veins.  If you have a catheter to drain urine from your bladder.  If you are on a ventilator. A ventilator is a machine that helps you breathe by moving air in and out of your lungs.  After surgery. What are the risks? Risks of using CHG include:  A skin reaction.  Hearing loss, if CHG gets in your ears.  Eye injury, if CHG gets in your eyes and is not rinsed out.  The CHG product catching fire. Make sure that you avoid smoking and flames after applying CHG to your skin. Do not use CHG:  If you have a chlorhexidine allergy or have previously reacted to chlorhexidine.  On babies younger than 24 months of age. How to use CHG solution  Use CHG only as told by your health care provider, and follow the instructions on the label.  Use the full amount of CHG as directed. Usually,  this is one bottle. During a shower Follow these steps when using CHG solution during a shower (unless your health care provider gives you different instructions): 1. Start the shower. 2. Use your normal soap and shampoo to wash your face and hair. 3. Turn off the shower or move out of the shower stream. 4. Pour the CHG onto a clean washcloth. Do not use any type of brush or rough-edged sponge. 5. Starting at your neck, lather your body down to your toes. Make sure you follow these instructions: ? If you will be having surgery, pay special attention to the part of your body where you will be having surgery. Scrub this area for at least 1 minute. ? Do not use CHG on your head or face. If the solution gets into your ears or eyes, rinse them well with water. ? Avoid your genital area. ? Avoid any areas of skin that have broken skin, cuts, or scrapes. ? Scrub your back and under your arms. Make sure to wash skin folds. 6. Let the lather sit on your skin for 1-2 minutes or as long as told by your health care provider. 7. Thoroughly rinse your entire body in the  shower. Make sure that all body creases and crevices are rinsed well. 8. Dry off with a clean towel. Do not put any substances on your body afterward--such as powder, lotion, or perfume--unless you are told to do so by your health care provider. Only use lotions that are recommended by the manufacturer. 9. Put on clean clothes or pajamas. 10. If it is the night before your surgery, sleep in clean sheets.   During a sponge bath Follow these steps when using CHG solution during a sponge bath (unless your health care provider gives you different instructions): 1. Use your normal soap and shampoo to wash your face and hair. 2. Pour the CHG onto a clean washcloth. 3. Starting at your neck, lather your body down to your toes. Make sure you follow these instructions: ? If you will be having surgery, pay special attention to the part of your body where you will be having surgery. Scrub this area for at least 1 minute. ? Do not use CHG on your head or face. If the solution gets into your ears or eyes, rinse them well with water. ? Avoid your genital area. ? Avoid any areas of skin that have broken skin, cuts, or scrapes. ? Scrub your back and under your arms. Make sure to wash skin folds. 4. Let the lather sit on your skin for 1-2 minutes or as long as told by your health care provider. 5. Using a different clean, wet washcloth, thoroughly rinse your entire body. Make sure that all body creases and crevices are rinsed well. 6. Dry off with a clean towel. Do not put any substances on your body afterward--such as powder, lotion, or perfume--unless you are told to do so by your health care provider. Only use lotions that are recommended by the manufacturer. 7. Put on clean clothes or pajamas. 8. If it is the night before your surgery, sleep in clean sheets. How to use CHG prepackaged cloths  Only use CHG cloths as told by your health care provider, and follow the instructions on the label.  Use the CHG  cloth on clean, dry skin.  Do not use the CHG cloth on your head or face unless your health care provider tells you to.  When washing with the CHG cloth: ? Avoid  your genital area. ? Avoid any areas of skin that have broken skin, cuts, or scrapes. Before surgery Follow these steps when using a CHG cloth to clean before surgery (unless your health care provider gives you different instructions): 1. Using the CHG cloth, vigorously scrub the part of your body where you will be having surgery. Scrub using a back-and-forth motion for 3 minutes. The area on your body should be completely wet with CHG when you are done scrubbing. 2. Do not rinse. Discard the cloth and let the area air-dry. Do not put any substances on the area afterward, such as powder, lotion, or perfume. 3. Put on clean clothes or pajamas. 4. If it is the night before your surgery, sleep in clean sheets.   For general bathing Follow these steps when using CHG cloths for general bathing (unless your health care provider gives you different instructions). 1. Use a separate CHG cloth for each area of your body. Make sure you wash between any folds of skin and between your fingers and toes. Wash your body in the following order, switching to a new cloth after each step: ? The front of your neck, shoulders, and chest. ? Both of your arms, under your arms, and your hands. ? Your stomach and groin area, avoiding the genitals. ? Your right leg and foot. ? Your left leg and foot. ? The back of your neck, your back, and your buttocks. 2. Do not rinse. Discard the cloth and let the area air-dry. Do not put any substances on your body afterward--such as powder, lotion, or perfume--unless you are told to do so by your health care provider. Only use lotions that are recommended by the manufacturer. 3. Put on clean clothes or pajamas. Contact a health care provider if:  Your skin gets irritated after scrubbing.  You have questions about  using your solution or cloth. Get help right away if:  Your eyes become very red or swollen.  Your eyes itch badly.  Your skin itches badly and is red or swollen.  Your hearing changes.  You have trouble seeing.  You have swelling or tingling in your mouth or throat.  You have trouble breathing.  You swallow any chlorhexidine. Summary  Chlorhexidine gluconate (CHG) is a germ-killing (antiseptic) solution that is used to clean the skin. Cleaning your skin with CHG may help to lower your risk for infection.  You may be given CHG to use for bathing. It may be in a bottle or in a prepackaged cloth to use on your skin. Carefully follow your health care provider's instructions and the instructions on the product label.  Do not use CHG if you have a chlorhexidine allergy.  Contact your health care provider if your skin gets irritated after scrubbing. This information is not intended to replace advice given to you by your health care provider. Make sure you discuss any questions you have with your health care provider. Document Revised: 10/27/2019 Document Reviewed: 10/27/2019 Elsevier Patient Education  2021 Elsevier Inc.  General Anesthesia, Adult General anesthesia is the use of medicines to make a person "go to sleep" (unconscious) for a medical procedure. General anesthesia must be used for certain procedures, and is often recommended for procedures that:  Last a long time.  Require you to be still or in an unusual position.  Are major and can cause blood loss. The medicines used for general anesthesia are called general anesthetics. As well as making you unconscious for a certain amount of  time, these medicines:  Prevent pain.  Control your blood pressure.  Relax your muscles. Tell a health care provider about:  Any allergies you have.  All medicines you are taking, including vitamins, herbs, eye drops, creams, and over-the-counter medicines.  Any problems you or  family members have had with anesthetic medicines.  Types of anesthetics you have had in the past.  Any blood disorders you have.  Any surgeries you have had.  Any medical conditions you have.  Any recent upper respiratory, chest, or ear infections.  Any history of: ? Heart or lung conditions, such as heart failure, sleep apnea, asthma, or chronic obstructive pulmonary disease (COPD). ? Financial planner. ? Depression or anxiety.  Any tobacco or drug use, including marijuana or alcohol use.  Whether you are pregnant or may be pregnant. What are the risks? Generally, this is a safe procedure. However, problems may occur, including:  Allergic reaction.  Lung and heart problems.  Inhaling food or liquid from the stomach into the lungs (aspiration).  Nerve injury.  Dental injury.  Air in the bloodstream, which can lead to stroke.  Extreme agitation or confusion (delirium) when you wake up from the anesthetic.  Waking up during your procedure and being unable to move. This is rare. These problems are more likely to develop if you are having a major surgery or if you have an advanced or serious medical condition. You can prevent some of these complications by answering all of your health care provider's questions thoroughly and by following all instructions before your procedure. General anesthesia can cause side effects, including:  Nausea or vomiting.  A sore throat from the breathing tube.  Hoarseness.  Wheezing or coughing.  Shaking chills.  Tiredness.  Body aches.  Anxiety.  Sleepiness or drowsiness.  Confusion or agitation. What happens before the procedure? Staying hydrated Follow instructions from your health care provider about hydration, which may include:  Up to 2 hours before the procedure - you may continue to drink clear liquids, such as water, clear fruit juice, black coffee, and plain tea.   Eating and drinking restrictions Follow  instructions from your health care provider about eating and drinking, which may include:  8 hours before the procedure - stop eating heavy meals or foods such as meat, fried foods, or fatty foods.  6 hours before the procedure - stop eating light meals or foods, such as toast or cereal.  6 hours before the procedure - stop drinking milk or drinks that contain milk.  2 hours before the procedure - stop drinking clear liquids. Medicines Ask your health care provider about:  Changing or stopping your regular medicines. This is especially important if you are taking diabetes medicines or blood thinners.  Taking medicines such as aspirin and ibuprofen. These medicines can thin your blood. Do not take these medicines unless your health care provider tells you to take them.  Taking over-the-counter medicines, vitamins, herbs, and supplements. Do not take these during the week before your procedure unless your health care provider approves them. General instructions  Starting 3-6 weeks before the procedure, do not use any products that contain nicotine or tobacco, such as cigarettes and e-cigarettes. If you need help quitting, ask your health care provider.  If you brush your teeth on the morning of the procedure, make sure to spit out all of the toothpaste.  Tell your health care provider if you become ill or develop a cold, cough, or fever.  If instructed by your  health care provider, bring your sleep apnea device with you on the day of your surgery (if applicable).  Ask your health care provider if you will be going home the same day, the following day, or after a longer hospital stay. ? Plan to have a responsible adult take you home from the hospital or clinic. ? Plan to have a responsible adult care for you for the time you are told after you leave the hospital or clinic. This is important. What happens during the procedure?  You will be given anesthetics through both of the  following: ? A mask placed over your nose and mouth. ? An IV in one of your veins.  You may receive a medicine to help you relax (sedative).  After you are unconscious, a breathing tube may be inserted down your throat to help you breathe. This will be removed before you wake up.  An anesthesia specialist will stay with you throughout your procedure. He or she will: ? Keep you comfortable and safe by continuing to give you medicines and adjusting the amount of medicine that you get. ? Monitor your blood pressure, pulse, and oxygen levels to make sure that the anesthetics do not cause any problems. The procedure may vary among health care providers and hospitals.   What happens after the procedure?  Your blood pressure, temperature, heart rate, breathing rate, and blood oxygen level will be monitored until the medicines you were given have worn off.  You will wake up in a recovery area. You may wake up slowly.  If you feel anxious or agitated, you may be given medicine to help you calm down.  If you will be going home the same day, your health care provider may check to make sure you can walk, drink, and urinate.  Your health care provider will treat any pain or side effects you have before you go home.  Do not drive or operate machinery until your health care provider says that it is safe. Summary  General anesthesia is used to keep you still and prevent pain during a procedure.  It is important to tell your health care provider about your medical history and any surgeries you have had, and previous experience with anesthesia.  Follow your health care provider's instructions about when to stop eating, drinking, or taking certain medicines before your procedure.  Plan to have a responsible adult take you home from the hospital or clinic. This information is not intended to replace advice given to you by your health care provider. Make sure you discuss any questions you have with your  health care provider. Document Revised: 01/22/2020 Document Reviewed: 08/23/2019 Elsevier Patient Education  2021 Elsevier Inc.  General Anesthesia, Adult, Care After This sheet gives you information about how to care for yourself after your procedure. Your health care provider may also give you more specific instructions. If you have problems or questions, contact your health care provider. What can I expect after the procedure? After the procedure, the following side effects are common:  Pain or discomfort at the IV site.  Nausea.  Vomiting.  Sore throat.  Trouble concentrating.  Feeling cold or chills.  Feeling weak or tired.  Sleepiness and fatigue.  Soreness and body aches. These side effects can affect parts of the body that were not involved in surgery. Follow these instructions at home: For the time period you were told by your health care provider:  Rest.  Do not participate in activities where you could  fall or become injured.  Do not drive or use machinery.  Do not drink alcohol.  Do not take sleeping pills or medicines that cause drowsiness.  Do not make important decisions or sign legal documents.  Do not take care of children on your own.   Eating and drinking  Follow any instructions from your health care provider about eating or drinking restrictions.  When you feel hungry, start by eating small amounts of foods that are soft and easy to digest (bland), such as toast. Gradually return to your regular diet.  Drink enough fluid to keep your urine pale yellow.  If you vomit, rehydrate by drinking water, juice, or clear broth. General instructions  If you have sleep apnea, surgery and certain medicines can increase your risk for breathing problems. Follow instructions from your health care provider about wearing your sleep device: ? Anytime you are sleeping, including during daytime naps. ? While taking prescription pain medicines, sleeping  medicines, or medicines that make you drowsy.  Have a responsible adult stay with you for the time you are told. It is important to have someone help care for you until you are awake and alert.  Return to your normal activities as told by your health care provider. Ask your health care provider what activities are safe for you.  Take over-the-counter and prescription medicines only as told by your health care provider.  If you smoke, do not smoke without supervision.  Keep all follow-up visits as told by your health care provider. This is important. Contact a health care provider if:  You have nausea or vomiting that does not get better with medicine.  You cannot eat or drink without vomiting.  You have pain that does not get better with medicine.  You are unable to pass urine.  You develop a skin rash.  You have a fever.  You have redness around your IV site that gets worse. Get help right away if:  You have difficulty breathing.  You have chest pain.  You have blood in your urine or stool, or you vomit blood. Summary  After the procedure, it is common to have a sore throat or nausea. It is also common to feel tired.  Have a responsible adult stay with you for the time you are told. It is important to have someone help care for you until you are awake and alert.  When you feel hungry, start by eating small amounts of foods that are soft and easy to digest (bland), such as toast. Gradually return to your regular diet.  Drink enough fluid to keep your urine pale yellow.  Return to your normal activities as told by your health care provider. Ask your health care provider what activities are safe for you. This information is not intended to replace advice given to you by your health care provider. Make sure you discuss any questions you have with your health care provider. Document Revised: 01/25/2020 Document Reviewed: 08/24/2019 Elsevier Patient Education  2021 [employer]

## 2020-08-08 ENCOUNTER — Other Ambulatory Visit (HOSPITAL_COMMUNITY)
Admission: RE | Admit: 2020-08-08 | Discharge: 2020-08-08 | Disposition: A | Discharge status: Home / Self Care | Payer: 59 | Source: Ambulatory Visit | Attending: General Surgery | Admitting: General Surgery

## 2020-08-08 ENCOUNTER — Other Ambulatory Visit: Payer: Self-pay

## 2020-08-08 ENCOUNTER — Encounter (HOSPITAL_COMMUNITY)
Admission: RE | Admit: 2020-08-08 | Discharge: 2020-08-08 | Disposition: A | Discharge status: Home / Self Care | Payer: 59 | Source: Ambulatory Visit | Attending: General Surgery | Admitting: General Surgery

## 2020-08-08 DIAGNOSIS — Z20822 Contact with and (suspected) exposure to covid-19: Secondary | ICD-10-CM | POA: Insufficient documentation

## 2020-08-08 DIAGNOSIS — Z01812 Encounter for preprocedural laboratory examination: Secondary | ICD-10-CM | POA: Insufficient documentation

## 2020-08-08 DIAGNOSIS — Z01818 Encounter for other preprocedural examination: Secondary | ICD-10-CM | POA: Insufficient documentation

## 2020-08-08 LAB — BASIC METABOLIC PANEL
Anion gap: 12 (ref 5–15)
BUN: 12 mg/dL (ref 8–23)
CO2: 31 mmol/L (ref 22–32)
Calcium: 8.6 mg/dL — ABNORMAL LOW (ref 8.9–10.3)
Chloride: 97 mmol/L — ABNORMAL LOW (ref 98–111)
Creatinine, Ser: 0.73 mg/dL (ref 0.61–1.24)
GFR, Estimated: >60 mL/min (ref >60)
Glucose, Bld: 108 mg/dL — ABNORMAL HIGH (ref 70–99)
Potassium: 3.5 mmol/L (ref 3.5–5.1)
Sodium: 140 mmol/L (ref 135–145)

## 2020-08-08 LAB — CBC WITH DIFFERENTIAL/PLATELET
Abs Immature Granulocytes: 0.01 10*3/uL (ref 0.00–0.07)
Basophils Absolute: 0.1 10*3/uL (ref 0.0–0.1)
Basophils Relative: 1 %
Eosinophils Absolute: 0.2 10*3/uL (ref 0.0–0.5)
Eosinophils Relative: 2 %
HCT: 47 % (ref 39.0–52.0)
Hemoglobin: 15.2 g/dL (ref 13.0–17.0)
Immature Granulocytes: 0 %
Lymphocytes Relative: 24 %
Lymphs Abs: 2.1 10*3/uL (ref 0.7–4.0)
MCH: 30.3 pg (ref 26.0–34.0)
MCHC: 32.3 g/dL (ref 30.0–36.0)
MCV: 93.8 fL (ref 80.0–100.0)
Monocytes Absolute: 0.5 10*3/uL (ref 0.1–1.0)
Monocytes Relative: 6 %
Neutro Abs: 6.1 10*3/uL (ref 1.7–7.7)
Neutrophils Relative %: 67 %
Platelets: 195 10*3/uL (ref 150–400)
RBC: 5.01 MIL/uL (ref 4.22–5.81)
RDW: 14.2 % (ref 11.5–15.5)
WBC: 9 10*3/uL (ref 4.0–10.5)
nRBC: 0 % (ref 0.0–0.2)

## 2020-08-08 LAB — SARS CORONAVIRUS 2 (TAT 6-24 HRS): SARS Coronavirus 2: NEGATIVE

## 2020-08-09 ENCOUNTER — Inpatient Hospital Stay (HOSPITAL_COMMUNITY)
Admission: RE | Admit: 2020-08-09 | Discharge: 2020-08-10 | DRG: 987 | Disposition: A | Discharge status: Home / Self Care | Payer: 59 | Attending: General Surgery | Admitting: General Surgery

## 2020-08-09 ENCOUNTER — Ambulatory Visit (HOSPITAL_COMMUNITY): Payer: 59 | Admitting: Anesthesiology

## 2020-08-09 ENCOUNTER — Encounter (HOSPITAL_COMMUNITY)
Admission: RE | Disposition: A | Discharge status: Home / Self Care | Payer: Self-pay | Source: Home / Self Care | Attending: General Surgery

## 2020-08-09 ENCOUNTER — Other Ambulatory Visit: Payer: Self-pay

## 2020-08-09 ENCOUNTER — Encounter (HOSPITAL_COMMUNITY): Payer: Self-pay | Admitting: General Surgery

## 2020-08-09 DIAGNOSIS — J9690 Respiratory failure, unspecified, unspecified whether with hypoxia or hypercapnia: Secondary | ICD-10-CM | POA: Diagnosis present

## 2020-08-09 DIAGNOSIS — E782 Mixed hyperlipidemia: Secondary | ICD-10-CM | POA: Diagnosis present

## 2020-08-09 DIAGNOSIS — F17210 Nicotine dependence, cigarettes, uncomplicated: Secondary | ICD-10-CM | POA: Diagnosis present

## 2020-08-09 DIAGNOSIS — I10 Essential (primary) hypertension: Secondary | ICD-10-CM | POA: Diagnosis not present

## 2020-08-09 DIAGNOSIS — Z9119 Patient's noncompliance with other medical treatment and regimen: Secondary | ICD-10-CM

## 2020-08-09 DIAGNOSIS — K420 Umbilical hernia with obstruction, without gangrene: Secondary | ICD-10-CM | POA: Diagnosis present

## 2020-08-09 DIAGNOSIS — Z20822 Contact with and (suspected) exposure to covid-19: Secondary | ICD-10-CM | POA: Diagnosis present

## 2020-08-09 DIAGNOSIS — Z79899 Other long term (current) drug therapy: Secondary | ICD-10-CM

## 2020-08-09 DIAGNOSIS — J9601 Acute respiratory failure with hypoxia: Principal | ICD-10-CM | POA: Diagnosis present

## 2020-08-09 DIAGNOSIS — Z8249 Family history of ischemic heart disease and other diseases of the circulatory system: Secondary | ICD-10-CM

## 2020-08-09 DIAGNOSIS — J9602 Acute respiratory failure with hypercapnia: Secondary | ICD-10-CM | POA: Diagnosis not present

## 2020-08-09 DIAGNOSIS — E119 Type 2 diabetes mellitus without complications: Secondary | ICD-10-CM | POA: Diagnosis not present

## 2020-08-09 DIAGNOSIS — G4733 Obstructive sleep apnea (adult) (pediatric): Secondary | ICD-10-CM

## 2020-08-09 DIAGNOSIS — R0902 Hypoxemia: Secondary | ICD-10-CM | POA: Diagnosis present

## 2020-08-09 DIAGNOSIS — J45909 Unspecified asthma, uncomplicated: Secondary | ICD-10-CM | POA: Diagnosis present

## 2020-08-09 DIAGNOSIS — E662 Morbid (severe) obesity with alveolar hypoventilation: Secondary | ICD-10-CM | POA: Diagnosis present

## 2020-08-09 DIAGNOSIS — E785 Hyperlipidemia, unspecified: Secondary | ICD-10-CM | POA: Diagnosis not present

## 2020-08-09 DIAGNOSIS — I471 Supraventricular tachycardia: Secondary | ICD-10-CM | POA: Diagnosis not present

## 2020-08-09 DIAGNOSIS — G9341 Metabolic encephalopathy: Secondary | ICD-10-CM | POA: Diagnosis present

## 2020-08-09 DIAGNOSIS — J9610 Chronic respiratory failure, unspecified whether with hypoxia or hypercapnia: Secondary | ICD-10-CM | POA: Diagnosis present

## 2020-08-09 DIAGNOSIS — Z6841 Body Mass Index (BMI) 40.0 and over, adult: Secondary | ICD-10-CM

## 2020-08-09 HISTORY — DX: Unspecified asthma, uncomplicated: J45.909

## 2020-08-09 HISTORY — DX: Essential (primary) hypertension: I10

## 2020-08-09 HISTORY — DX: Type 2 diabetes mellitus without complications: E11.9

## 2020-08-09 HISTORY — PX: UMBILICAL HERNIA REPAIR: SHX196

## 2020-08-09 HISTORY — DX: Male erectile dysfunction, unspecified: N52.9

## 2020-08-09 HISTORY — DX: Hyperlipidemia, unspecified: E78.5

## 2020-08-09 HISTORY — DX: Allergic rhinitis, unspecified: J30.9

## 2020-08-09 HISTORY — DX: Personal history of other infectious and parasitic diseases: Z86.19

## 2020-08-09 LAB — BLOOD GAS, ARTERIAL
Acid-Base Excess: 6.2 mmol/L — ABNORMAL HIGH (ref 0.0–2.0)
Acid-Base Excess: 7 mmol/L — ABNORMAL HIGH (ref 0.0–2.0)
Bicarbonate: 25.7 mmol/L (ref 20.0–28.0)
Bicarbonate: 28.4 mmol/L — ABNORMAL HIGH (ref 20.0–28.0)
FIO2: 100
FIO2: 40
O2 Saturation: 99.1 %
O2 Saturation: 97.2 %
Patient temperature: 36.9
Patient temperature: 37
pCO2 arterial: 102 mmHg (ref 32.0–48.0)
pCO2 arterial: 72.7 mmHg (ref 32.0–48.0)
pH, Arterial: 7.158 — CL (ref 7.350–7.450)
pH, Arterial: 7.285 — ABNORMAL LOW (ref 7.350–7.450)
pO2, Arterial: 339 mmHg — ABNORMAL HIGH (ref 83.0–108.0)
pO2, Arterial: 99.4 mmHg (ref 83.0–108.0)

## 2020-08-09 SURGERY — REPAIR, HERNIA, UMBILICAL, ADULT
Anesthesia: General

## 2020-08-09 MED ORDER — PROPOFOL 10 MG/ML IV BOLUS
INTRAVENOUS | Status: AC
  Filled 2020-08-09: qty 20

## 2020-08-09 MED ORDER — KETAMINE HCL 10 MG/ML IJ SOLN
INTRAMUSCULAR | Status: DC | PRN
  Administered 2020-08-09: 20 mg via INTRAVENOUS

## 2020-08-09 MED ORDER — DIPHENHYDRAMINE HCL 50 MG/ML IJ SOLN
25.0000 mg | Freq: Four times a day (QID) | INTRAMUSCULAR | Status: DC | PRN
Start: 2020-08-09 — End: 2020-08-10

## 2020-08-09 MED ORDER — ONDANSETRON HCL 4 MG/2ML IJ SOLN
INTRAMUSCULAR | Status: DC | PRN
  Administered 2020-08-09: 4 mg via INTRAVENOUS

## 2020-08-09 MED ORDER — ONDANSETRON HCL 4 MG/2ML IJ SOLN: 4.0000 mg | Freq: Once | INTRAMUSCULAR | Status: DC | PRN

## 2020-08-09 MED ORDER — CHLORHEXIDINE GLUCONATE CLOTH 2 % EX PADS: 6.0000 | MEDICATED_PAD | Freq: Once | CUTANEOUS | Status: AC

## 2020-08-09 MED ORDER — ONDANSETRON HCL 4 MG/2ML IJ SOLN
INTRAMUSCULAR | Status: AC
  Filled 2020-08-09: qty 2

## 2020-08-09 MED ORDER — DIPHENHYDRAMINE HCL 25 MG PO CAPS: 25.0000 mg | ORAL_CAPSULE | Freq: Four times a day (QID) | ORAL | Status: DC | PRN

## 2020-08-09 MED ORDER — PROPOFOL 10 MG/ML IV BOLUS
INTRAVENOUS | Status: DC | PRN
  Administered 2020-08-09: 170 mg via INTRAVENOUS

## 2020-08-09 MED ORDER — HYPROMELLOSE (GONIOSCOPIC) 2.5 % OP SOLN
1.0000 [drp] | Freq: Three times a day (TID) | OPHTHALMIC | Status: DC | PRN
  Filled 2020-08-09: qty 15

## 2020-08-09 MED ORDER — BUPIVACAINE LIPOSOME 1.3 % IJ SUSP
INTRAMUSCULAR | Status: DC | PRN
  Administered 2020-08-09: 20 mL

## 2020-08-09 MED ORDER — ONDANSETRON HCL 4 MG/2ML IJ SOLN: 4.0000 mg | Freq: Four times a day (QID) | INTRAMUSCULAR | Status: DC | PRN

## 2020-08-09 MED ORDER — AMLODIPINE BESYLATE 5 MG PO TABS: 5.0000 mg | ORAL_TABLET | Freq: Every day | ORAL | Status: DC

## 2020-08-09 MED ORDER — ALBUTEROL SULFATE HFA 108 (90 BASE) MCG/ACT IN AERS: 2.0000 | INHALATION_SPRAY | Freq: Four times a day (QID) | RESPIRATORY_TRACT | Status: DC | PRN

## 2020-08-09 MED ORDER — ORAL CARE MOUTH RINSE: 15.0000 mL | Freq: Once | OROMUCOSAL | Status: AC

## 2020-08-09 MED ORDER — MIDAZOLAM HCL 2 MG/2ML IJ SOLN
INTRAMUSCULAR | Status: AC
  Filled 2020-08-09: qty 2

## 2020-08-09 MED ORDER — MORPHINE SULFATE (PF) 2 MG/ML IV SOLN: 2.0000 mg | INTRAVENOUS | Status: DC | PRN

## 2020-08-09 MED ORDER — LABETALOL HCL 5 MG/ML IV SOLN
INTRAVENOUS | Status: AC
  Filled 2020-08-09: qty 4

## 2020-08-09 MED ORDER — LACTATED RINGERS IV SOLN: INTRAVENOUS | Status: DC

## 2020-08-09 MED ORDER — METOPROLOL TARTRATE 5 MG/5ML IV SOLN
INTRAVENOUS | Status: AC
  Filled 2020-08-09: qty 5

## 2020-08-09 MED ORDER — ENOXAPARIN SODIUM 40 MG/0.4ML ~~LOC~~ SOLN
40.0000 mg | SUBCUTANEOUS | Status: DC
  Administered 2020-08-10: 40 mg via SUBCUTANEOUS
  Filled 2020-08-09: qty 0.4

## 2020-08-09 MED ORDER — HYDROCODONE-ACETAMINOPHEN 10-325 MG PO TABS
1.0000 | ORAL_TABLET | Freq: Three times a day (TID) | ORAL | Status: DC | PRN
Start: 2020-08-09 — End: 2020-08-10
  Administered 2020-08-10: 1 via ORAL
  Filled 2020-08-09: qty 1

## 2020-08-09 MED ORDER — BUPIVACAINE LIPOSOME 1.3 % IJ SUSP
INTRAMUSCULAR | Status: AC
  Filled 2020-08-09: qty 20

## 2020-08-09 MED ORDER — ROCURONIUM BROMIDE 10 MG/ML (PF) SYRINGE
PREFILLED_SYRINGE | INTRAVENOUS | Status: DC | PRN
  Administered 2020-08-09: 100 mg via INTRAVENOUS

## 2020-08-09 MED ORDER — FENTANYL CITRATE (PF) 100 MCG/2ML IJ SOLN
INTRAMUSCULAR | Status: DC | PRN
  Administered 2020-08-09: 50 ug via INTRAVENOUS
  Administered 2020-08-09: 100 ug via INTRAVENOUS

## 2020-08-09 MED ORDER — FENTANYL CITRATE (PF) 250 MCG/5ML IJ SOLN
INTRAMUSCULAR | Status: AC
  Filled 2020-08-09: qty 5

## 2020-08-09 MED ORDER — IPRATROPIUM-ALBUTEROL 0.5-2.5 (3) MG/3ML IN SOLN: 3.0000 mL | RESPIRATORY_TRACT | Status: DC

## 2020-08-09 MED ORDER — METOPROLOL SUCCINATE ER 25 MG PO TB24
25.0000 mg | ORAL_TABLET | Freq: Every day | ORAL | Status: DC
  Administered 2020-08-10: 25 mg via ORAL
  Filled 2020-08-09: qty 1

## 2020-08-09 MED ORDER — HYDROMORPHONE HCL 1 MG/ML IJ SOLN
0.2500 mg | INTRAMUSCULAR | Status: DC | PRN
  Administered 2020-08-09: 0.5 mg via INTRAVENOUS
  Filled 2020-08-09: qty 0.5

## 2020-08-09 MED ORDER — KETOROLAC TROMETHAMINE 30 MG/ML IJ SOLN
30.0000 mg | Freq: Once | INTRAMUSCULAR | Status: AC
  Administered 2020-08-09: 30 mg via INTRAVENOUS
  Filled 2020-08-09: qty 1

## 2020-08-09 MED ORDER — DEXAMETHASONE SODIUM PHOSPHATE 10 MG/ML IJ SOLN
INTRAMUSCULAR | Status: DC | PRN
  Administered 2020-08-09: 10 mg via INTRAVENOUS

## 2020-08-09 MED ORDER — POVIDONE-IODINE 10 % EX OINT
TOPICAL_OINTMENT | CUTANEOUS | Status: AC
  Filled 2020-08-09: qty 1

## 2020-08-09 MED ORDER — GLYCOPYRROLATE PF 0.2 MG/ML IJ SOSY
PREFILLED_SYRINGE | INTRAMUSCULAR | Status: AC
  Filled 2020-08-09: qty 3

## 2020-08-09 MED ORDER — METOPROLOL TARTRATE 5 MG/5ML IV SOLN
5.0000 mg | INTRAVENOUS | Status: AC
  Administered 2020-08-09: 5 mg via INTRAVENOUS

## 2020-08-09 MED ORDER — SUGAMMADEX SODIUM 500 MG/5ML IV SOLN
INTRAVENOUS | Status: DC | PRN
  Administered 2020-08-09: 300 mg via INTRAVENOUS
  Administered 2020-08-09: 200 mg via INTRAVENOUS

## 2020-08-09 MED ORDER — KETAMINE HCL 50 MG/5ML IJ SOSY
PREFILLED_SYRINGE | INTRAMUSCULAR | Status: AC
  Filled 2020-08-09: qty 5

## 2020-08-09 MED ORDER — EPHEDRINE SULFATE 50 MG/ML IJ SOLN
INTRAMUSCULAR | Status: DC | PRN
  Administered 2020-08-09: 30 mg via INTRAVENOUS

## 2020-08-09 MED ORDER — METOPROLOL TARTRATE 5 MG/5ML IV SOLN: 2.5000 mg | INTRAVENOUS | Status: DC | PRN

## 2020-08-09 MED ORDER — LIDOCAINE HCL (CARDIAC) PF 100 MG/5ML IV SOSY
PREFILLED_SYRINGE | INTRAVENOUS | Status: DC | PRN
  Administered 2020-08-09: 100 mg via INTRAVENOUS

## 2020-08-09 MED ORDER — PHENYLEPHRINE HCL (PRESSORS) 10 MG/ML IV SOLN
INTRAVENOUS | Status: DC | PRN
  Administered 2020-08-09 (×2): 200 ug via INTRAVENOUS
  Administered 2020-08-09: 400 ug via INTRAVENOUS

## 2020-08-09 MED ORDER — IPRATROPIUM-ALBUTEROL 0.5-2.5 (3) MG/3ML IN SOLN
RESPIRATORY_TRACT | Status: AC
  Administered 2020-08-09: 3 mL
  Filled 2020-08-09: qty 3

## 2020-08-09 MED ORDER — GLYCOPYRROLATE PF 0.2 MG/ML IJ SOSY
PREFILLED_SYRINGE | INTRAMUSCULAR | Status: DC | PRN
  Administered 2020-08-09: .2 mg via INTRAVENOUS

## 2020-08-09 MED ORDER — LIDOCAINE HCL (PF) 2 % IJ SOLN
INTRAMUSCULAR | Status: AC
  Filled 2020-08-09: qty 5

## 2020-08-09 MED ORDER — MIDAZOLAM HCL 5 MG/5ML IJ SOLN
INTRAMUSCULAR | Status: DC | PRN
  Administered 2020-08-09: 2 mg via INTRAVENOUS

## 2020-08-09 MED ORDER — SIMETHICONE 80 MG PO CHEW: 40.0000 mg | CHEWABLE_TABLET | Freq: Four times a day (QID) | ORAL | Status: DC | PRN

## 2020-08-09 MED ORDER — ONDANSETRON 4 MG PO TBDP: 4.0000 mg | ORAL_TABLET | Freq: Four times a day (QID) | ORAL | Status: DC | PRN

## 2020-08-09 MED ORDER — CEFAZOLIN SODIUM-DEXTROSE 2-4 GM/100ML-% IV SOLN
2.0000 g | INTRAVENOUS | Status: AC
  Administered 2020-08-09: 2 g via INTRAVENOUS
  Filled 2020-08-09: qty 100

## 2020-08-09 MED ORDER — SODIUM CHLORIDE 0.9 % IR SOLN
Status: DC | PRN
  Administered 2020-08-09: 1000 mL

## 2020-08-09 MED ORDER — TORSEMIDE 20 MG PO TABS
20.0000 mg | ORAL_TABLET | Freq: Two times a day (BID) | ORAL | Status: DC
  Filled 2020-08-09: qty 1

## 2020-08-09 MED ORDER — SODIUM CHLORIDE 0.9 % IV SOLN: INTRAVENOUS | Status: DC

## 2020-08-09 MED ORDER — POTASSIUM CHLORIDE 10 MEQ/100ML IV SOLN
10.0000 meq | INTRAVENOUS | Status: AC
  Administered 2020-08-09 (×2): 10 meq via INTRAVENOUS
  Filled 2020-08-09 (×2): qty 100

## 2020-08-09 MED ORDER — MEPERIDINE HCL 50 MG/ML IJ SOLN: 6.2500 mg | INTRAMUSCULAR | Status: DC | PRN

## 2020-08-09 MED ORDER — BUSPIRONE HCL 5 MG PO TABS
10.0000 mg | ORAL_TABLET | Freq: Three times a day (TID) | ORAL | Status: DC
  Administered 2020-08-10: 10 mg via ORAL
  Filled 2020-08-09: qty 2

## 2020-08-09 MED ORDER — CHLORHEXIDINE GLUCONATE 0.12 % MT SOLN
15.0000 mL | Freq: Once | OROMUCOSAL | Status: AC
  Administered 2020-08-09: 15 mL via OROMUCOSAL
  Filled 2020-08-09: qty 15

## 2020-08-09 MED ORDER — ACETAMINOPHEN 650 MG RE SUPP: 650.0000 mg | Freq: Four times a day (QID) | RECTAL | Status: DC | PRN

## 2020-08-09 MED ORDER — ACETAMINOPHEN 325 MG PO TABS: 650.0000 mg | ORAL_TABLET | Freq: Four times a day (QID) | ORAL | Status: DC | PRN

## 2020-08-09 MED ORDER — VASOPRESSIN 20 UNIT/ML IV SOLN
INTRAVENOUS | Status: AC
  Filled 2020-08-09: qty 1

## 2020-08-09 MED ORDER — LOSARTAN POTASSIUM 50 MG PO TABS: 100.0000 mg | ORAL_TABLET | Freq: Every day | ORAL | Status: DC

## 2020-08-09 MED ORDER — SUCCINYLCHOLINE CHLORIDE 200 MG/10ML IV SOSY
PREFILLED_SYRINGE | INTRAVENOUS | Status: DC | PRN
  Administered 2020-08-09: 180 mg via INTRAVENOUS

## 2020-08-09 MED ORDER — ALPRAZOLAM 0.5 MG PO TABS
0.5000 mg | ORAL_TABLET | Freq: Four times a day (QID) | ORAL | Status: DC | PRN
  Administered 2020-08-10: 0.5 mg via ORAL
  Filled 2020-08-09: qty 1

## 2020-08-09 MED ORDER — CEFAZOLIN SODIUM-DEXTROSE 1-4 GM/50ML-% IV SOLN
1.0000 g | Freq: Once | INTRAVENOUS | Status: AC
  Administered 2020-08-09: 1 g via INTRAVENOUS
  Filled 2020-08-09: qty 50

## 2020-08-09 SURGICAL SUPPLY — 44 items
ADH SKN CLS APL DERMABOND .7 (GAUZE/BANDAGES/DRESSINGS) ×1
APL PRP STRL LF DISP 70% ISPRP (MISCELLANEOUS) ×1
BLADE SURG SZ11 CARB STEEL (BLADE) ×2 IMPLANT
CHLORAPREP W/TINT 26 (MISCELLANEOUS) ×2 IMPLANT
CLOTH BEACON ORANGE TIMEOUT ST (SAFETY) ×2 IMPLANT
COVER LIGHT HANDLE STERIS (MISCELLANEOUS) ×4 IMPLANT
COVER WAND RF STERILE (DRAPES) ×2 IMPLANT
DERMABOND ADVANCED (GAUZE/BANDAGES/DRESSINGS) ×1
DERMABOND ADVANCED .7 DNX12 (GAUZE/BANDAGES/DRESSINGS) ×1 IMPLANT
ELECT REM PT RETURN 9FT ADLT (ELECTROSURGICAL) ×2
ELECTRODE REM PT RTRN 9FT ADLT (ELECTROSURGICAL) ×1 IMPLANT
GAUZE SPONGE 4X4 12PLY STRL (GAUZE/BANDAGES/DRESSINGS) ×1 IMPLANT
GLOVE EUDERMIC 6.5 POWDERFREE (GLOVE) ×1 IMPLANT
GLOVE SS BIOGEL STRL SZ 6.5 (GLOVE) IMPLANT
GLOVE SS BIOGEL STRL SZ 7.5 (GLOVE) IMPLANT
GLOVE SUPERSENSE BIOGEL SZ 6.5 (GLOVE) ×1
GLOVE SUPERSENSE BIOGEL SZ 7.5 (GLOVE) ×1
GLOVE SURG ENC TEXT LTX SZ6.5 (GLOVE) ×1 IMPLANT
GLOVE SURG ENC TEXT LTX SZ7 (GLOVE) ×1 IMPLANT
GLOVE SURG SS PI 7.5 STRL IVOR (GLOVE) ×2 IMPLANT
GLOVE SURG UNDER POLY LF SZ7 (GLOVE) ×4 IMPLANT
GOWN STRL REUS W/TWL LRG LVL3 (GOWN DISPOSABLE) ×4 IMPLANT
INST SET MINOR GENERAL (KITS) ×2 IMPLANT
KIT TURNOVER KIT A (KITS) ×2 IMPLANT
LIGASURE IMPACT 36 18CM CVD LR (INSTRUMENTS) ×1 IMPLANT
MANIFOLD NEPTUNE II (INSTRUMENTS) ×2 IMPLANT
MESH VENTRALEX ST 2.5 CRC MED (Mesh General) ×1 IMPLANT
NDL HYPO 18GX1.5 BLUNT FILL (NEEDLE) ×1 IMPLANT
NEEDLE HYPO 18GX1.5 BLUNT FILL (NEEDLE) ×2 IMPLANT
NEEDLE HYPO 21X1.5 SAFETY (NEEDLE) ×2 IMPLANT
NS IRRIG 1000ML POUR BTL (IV SOLUTION) ×2 IMPLANT
PACK MINOR (CUSTOM PROCEDURE TRAY) ×2 IMPLANT
PAD ARMBOARD 7.5X6 YLW CONV (MISCELLANEOUS) ×2 IMPLANT
PENCIL SMOKE EVACUATOR (MISCELLANEOUS) ×2 IMPLANT
SET BASIN LINEN APH (SET/KITS/TRAYS/PACK) ×2 IMPLANT
STAPLER VISISTAT (STAPLE) ×2 IMPLANT
SUT ETHIBOND NAB MO 7 #0 18IN (SUTURE) ×2 IMPLANT
SUT MNCRL AB 4-0 PS2 18 (SUTURE) ×1 IMPLANT
SUT VIC AB 2-0 CT2 27 (SUTURE) ×3 IMPLANT
SUT VIC AB 3-0 SH 27 (SUTURE)
SUT VIC AB 3-0 SH 27X BRD (SUTURE) ×1 IMPLANT
SUT VICRYL AB 3 0 TIES (SUTURE) IMPLANT
SYR 20ML LL LF (SYRINGE) ×4 IMPLANT
TAPE HYPAFIX 6X30 (GAUZE/BANDAGES/DRESSINGS) IMPLANT

## 2020-08-09 NOTE — Consult Note (Signed)
Cardiology Consultation:   Patient ID: [name]; [id number]; [birth year]   Admit date: 08/09/2020 Date of Consult: 08/09/2020  Primary Care Provider: Rebekah Chesterfield, NP Consulting Cardiologist: Jonelle Sidle, MD Primary Electrophysiologist: None   Patient Profile:   [name] is a 65 y.o. male with a history of type 2 diabetes mellitus, OSA noncompliant with CPAP, hyperlipidemia, and hypertension who is being seen today for the evaluation of intermittent tachycardia at the request of Dr. Alva Garnet.  History of Present Illness:   Mr. [name] presented today for elective umbilical herniorrhaphy with mesh repair under general anesthesia, procedure performed by Dr. Lovell Sheehan.  Cardiology was consulted due to intermittent episodes of tachycardia documented, mainly in the postoperative setting.  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  These episodes spontaneously resolved fairly quickly.  He has had rare PVCs but no ventricular arrhythmias.  He was given a single dose of IV Lopressor 5 mg, heart rate has settled down into the 80s.  In recovery he has been somewhat slow to improve in terms of oxygenation and mental status.  As noted above, he does have OSA that has untreated at home and is morbidly obese at baseline.  He does not describe any sense of palpitations at baseline, no prior documented cardiac arrhythmias.  Past Medical History:  Diagnosis Date  . Allergic rhinitis   . Arthritis    Avascular necrosis of L shoulder, knees  . Asthma   . ED (erectile dysfunction)   . Essential hypertension   . H/O hiatal hernia   . History of shingles   . Hyperlipidemia   . Sleep apnea    Does not use CPAP  . Type 2 diabetes mellitus (HCC)     Past Surgical History:  Procedure Laterality Date  . Fx Humerus     Left- 2005, required 2 surgeries   . SHOULDER HEMI-ARTHROPLASTY   04/11/2012   Procedure: SHOULDER HEMI-ARTHROPLASTY;  Surgeon: [name], MD;  Location: [facility] OR;  Service: Orthopedics;  Laterality: Left;  LEFT SHOULDER HEMIARTHROPLASTY   . SHOULDER SURGERY  2005   Left    Outpatient medications: No current facility-administered medications on file prior to encounter.   Current Outpatient Medications on File Prior to Encounter  Medication Sig Dispense Refill  . acetaminophen (TYLENOL) 500 MG tablet Take 1,000 mg by mouth every 6 (six) hours as needed for moderate pain.    Marland Kitchen albuterol (PROVENTIL HFA;VENTOLIN HFA) 108 (90 Base) MCG/ACT inhaler INHALE ONE TO TWO PUFFS BY MOUTH EVERY 6 HOURS AS NEEDED FOR WHEEZING OR SHORTNESS OF BREATH (Patient taking differently: Inhale 2 puffs into the lungs every 6 (six) hours as needed for wheezing or shortness of breath.) 18 g 0  . ALPRAZolam (XANAX) 0.5 MG tablet Take 0.5 mg by mouth 4 (four) times daily as needed for anxiety.    Marland Kitchen amLODipine (NORVASC) 5 MG tablet Take 5 mg by mouth daily.    . busPIRone (BUSPAR) 10 MG tablet Take 10 mg by mouth 3 (three) times daily.    . Chlorpheniramine-DM (CORICIDIN HBP COUGH/COLD PO) Take 2 capsules by mouth daily as needed (cough).    Marland Kitchen HYDROcodone-acetaminophen (NORCO) 10-325 MG tablet Take 1 tablet by mouth every 8 (eight) hours as needed for moderate pain.    . hydroxypropyl methylcellulose / hypromellose (ISOPTO TEARS / GONIOVISC) 2.5 % ophthalmic solution Place 1 drop into both eyes 3 (  three) times daily as needed for dry eyes.    Marland Kitchen losartan (COZAAR) 100 MG tablet Take 1 tablet (100 mg total) by mouth daily. 90 tablet 3  . lovastatin (MEVACOR) 40 MG tablet Take 1 tablet (40 mg total) by mouth daily with breakfast. 30 tablet 0  . Multiple Vitamin (MULTIVITAMIN WITH MINERALS) TABS tablet Take 1 tablet by mouth daily.    Marland Kitchen torsemide (DEMADEX) 20 MG tablet Take 20 mg by mouth 2 (two) times daily.    Marland Kitchen ALPRAZolam (XANAX) 0.25 MG tablet TAKE ONE TABLET BY MOUTH THREE  TIMES DAILY AS NEEDED FOR ANXIETY. (Patient not taking: Reported on 07/26/2020) 90 tablet 0    Inpatient Medications: Scheduled Meds: . Chlorhexidine Gluconate Cloth  6 each Topical Once   And  . Chlorhexidine Gluconate Cloth  6 each Topical Once  . ipratropium-albuterol  3 mL Nebulization Q4H   Continuous Infusions: . lactated ringers 1,000 mL/hr at 08/09/20 1157   PRN Meds: bupivacaine liposome, HYDROmorphone (DILAUDID) injection, meperidine (DEMEROL) injection, ondansetron (ZOFRAN) IV, sodium chloride irrigation  Allergies:   No Known Allergies  Social History:   Social History   Tobacco Use  . Smoking status: Current Every Day Smoker    Packs/day: 0.50    Years: 31.00    Pack years: 15.50    Types: Cigarettes  . Smokeless tobacco: Former Neurosurgeon  . Tobacco comment: smoking less than 1/2 a day (10/08/14)  Substance Use Topics  . Alcohol use: No    Alcohol/week: 0.0 standard drinks    Family History:   The patient's family history includes Heart disease in his father and mother. There is no history of Cancer.  ROS:  Please see the history of present illness.  All other ROS reviewed and negative.     Physical Exam/Data:   Vitals:   08/09/20 1538 08/09/20 1543 08/09/20 1545 08/09/20 1600  BP:   132/67 (!) 121/52  Pulse: 80 95 88 89  Resp: 18 17 19 18   Temp:      TempSrc:      SpO2: 100% (!) 89% 92% 99%  Weight:      Height:        Intake/Output Summary (Last 24 hours) at 08/09/2020 1615 Last data filed at 08/09/2020 1155 Gross per 24 hour  Intake --  Output 10 ml  Net -10 ml   Filed Weights   08/09/20 0959  Weight: (!) 167.8 kg   Body mass index is 45.03 kg/m.   Gen: Morbidly obese male, no distress. HEENT: Conjunctiva and lids normal, nonrebreather mask. Neck: Supple, increased girth without obvious elevated JVP. Lungs: Decreased breath sounds, no wheezing or rhonchi. Cardiac: Distant regular heart sounds, no gallop. Abdomen: Obese, nontender, lower  abdomen dressed. Extremities: Chronic appearing lower leg edema. Skin: Warm and dry. Musculoskeletal: No kyphosis. Neuropsychiatric: Alert and oriented x3, affect grossly appropriate.  EKG:  An ECG dated 08/08/2020 was personally reviewed today and demonstrated:  Sinus rhythm with PAC.  Telemetry:  I personally reviewed telemetry which shows sinus rhythm.  Relevant CV Studies:  Echocardiogram 02/16/2012: - Left ventricle: The cavity size was mildly dilated. Wall  thickness was normal. Systolic function was normal. The  estimated ejection fraction was in the range of 55% to  60%. Features are consistent with a pseudonormal left  ventricular filling pattern, with concomitant abnormal  relaxation and increased filling pressure (grade 2  diastolic dysfunction).  - Left atrium: The atrium was mildly dilated.   Laboratory Data:  Chemistry Recent  Labs  Lab 08/08/20 1002  NA 140  K 3.5  CL 97*  CO2 31  GLUCOSE 108*  BUN 12  CREATININE 0.73  CALCIUM 8.6*  GFRNONAA >60  ANIONGAP 12    No results for input(s): PROT, ALBUMIN, AST, ALT, ALKPHOS, BILITOT in the last 168 hours. Hematology Recent Labs  Lab 08/08/20 1002  WBC 9.0  RBC 5.01  HGB 15.2  HCT 47.0  MCV 93.8  MCH 30.3  MCHC 32.3  RDW 14.2  PLT 195    Radiology/Studies:  No results found.  Assessment and Plan:   1.  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  He is not on any AV nodal blockers at baseline.  2.  Morbid obesity with untreated OSA.  This places him at increased risk for recurring atrial arrhythmias.  3.  Essential hypertension, on losartan and Norvasc.  4.  Mixed hyperlipidemia, on Mevacor.  Recommend starting Toprol-XL 25 mg daily and continue baseline antihypertensive therapy.  May need higher dose, but can reevaluate as an outpatient once he has completely recovered from surgery.  Please  schedule him to see [place] in the [facility] office over the next 2 to 3 weeks if he is discharged home today.  He will eventually need a follow-up echocardiogram as an outpatient as well.  No clear indication for him to be hospitalized due to the PSVT, but would continue to follow his respiratory status closely and mental status as main parameters for determining discharge.  He would certainly benefit from treatment for OSA.  Signed, Nona Dell, MD  08/09/2020 4:15 PM

## 2020-08-09 NOTE — Anesthesia Procedure Notes (Signed)
Procedure Name: Intubation Date/Time: 08/09/2020 11:06 AM Performed by: Gayland Curry, CRNA Pre-anesthesia Checklist: Patient identified, Emergency Drugs available, Suction available and Patient being monitored Patient Re-evaluated:Patient Re-evaluated prior to induction Oxygen Delivery Method: Circle system utilized Preoxygenation: Pre-oxygenation with 100% oxygen Induction Type: IV induction Ventilation: Mask ventilation without difficulty Laryngoscope Size: Mac and 4 Grade View: Grade I Tube type: Oral Tube size: 8.0 (per Dr Charna Elizabeth) mm Number of attempts: 1 Placement Confirmation: ETT inserted through vocal cords under direct vision,  positive ETCO2 and breath sounds checked- equal and bilateral Secured at: 24 cm Tube secured with: Tape Dental Injury: Teeth and Oropharynx as per pre-operative assessment

## 2020-08-09 NOTE — Transfer of Care (Signed)
Immediate Anesthesia Transfer of Care Note  Patient: [name]  Procedure(s) Performed: HERNIA REPAIR UMBILICAL ADULT (N/A )  Patient Location: PACU  Anesthesia Type:General  Level of Consciousness: awake, alert , oriented and patient cooperative  Airway & Oxygen Therapy: Patient Spontanous Breathing and Patient connected to face mask oxygen  Post-op Assessment: Report given to RN  Post vital signs: Reviewed and stable  Last Vitals:  Vitals Value Taken Time  BP 145/73 08/09/20 1156  Temp    Pulse 104 08/09/20 1158  Resp 21 08/09/20 1158  SpO2 97 % 08/09/20 1158  Vitals shown include unvalidated device data.  Last Pain:  Vitals:   08/09/20 0959  TempSrc: Oral      Patients Stated Pain Goal: 8 (08/09/20 0959)  Complications: No complications documented.

## 2020-08-09 NOTE — Anesthesia Postprocedure Evaluation (Signed)
Anesthesia Post Note  Patient: [name]  Procedure(s) Performed: HERNIA REPAIR UMBILICAL ADULT (N/A )  Patient location during evaluation: PACU Anesthesia Type: General Level of consciousness: sedated, patient cooperative and responds to stimulation (patient wakes with stimlation, responds to comands, ) Pain management: pain level controlled Vital Signs Assessment: post-procedure vital signs reviewed and stable Respiratory status: spontaneous breathing and patient connected to face mask oxygen Cardiovascular status: blood pressure returned to baseline and stable Postop Assessment: no apparent nausea or vomiting Anesthetic complications: no Comments: Patient had episodes of tachycardia in PACU, consulted cardiology, metoprolol 5 mg was given, heart rate controlled and stable but patient is very sleepy need  Oxygen mask at 10 liters, nursing staff contacted Dr. Lovell Sheehan and agreed to keep him overnight for observation,Patient has h/o OSA and is not using mask because it is uncomfortable to him.  will order and keep him onBiPAP during his hospital stay .    No complications documented.   Last Vitals:  Vitals:   08/09/20 1545 08/09/20 1600  BP: 132/67 (!) 121/52  Pulse: 88 89  Resp: 19 18  Temp:    SpO2: 92% 99%    Last Pain:  Vitals:   08/09/20 1500  TempSrc:   PainSc: Asleep                 [name] C Alayssa Flinchum

## 2020-08-09 NOTE — Progress Notes (Signed)
Dr Diona Browner has been to re eval pt- will put in prog note and recommendations. Does not feel pt needs adm from his standpoint. Have been unable to wean O2 as pt desats quickly at 6l/NRB. Relayed this info to Dr Alva Garnet who feels pt should be adm as he is very sleepy and inability to wean O2, feels he needs CPAP and asked that I call Dr Lovell Sheehan. Called Dr Lovell Sheehan and relayed info- he will put in for obs bed.

## 2020-08-09 NOTE — Consult Note (Signed)
Patient Demographics  [name], is a 65 y.o. male   MRN: [id number]   DOB - [birth year]  Admit Date - 08/09/2020    Outpatient Primary MD for the patient is Rebekah Chesterfield, NP  Consult requested in the Hospital by Franky Macho, MD, On 08/09/2020    Reason for consult : respiratory failure.   With History of -  Past Medical History:  Diagnosis Date  . Allergic rhinitis   . Arthritis    Avascular necrosis of L shoulder, knees  . Asthma   . ED (erectile dysfunction)   . Essential hypertension   . H/O hiatal hernia   . History of shingles   . Hyperlipidemia   . Sleep apnea    Does not use CPAP  . Type 2 diabetes mellitus (HCC)       Past Surgical History:  Procedure Laterality Date  . Fx Humerus     Left- 2005, required 2 surgeries   . SHOULDER HEMI-ARTHROPLASTY  04/11/2012   Procedure: SHOULDER HEMI-ARTHROPLASTY;  Surgeon: [name], MD;  Location: [facility] OR;  Service: Orthopedics;  Laterality: Left;  LEFT SHOULDER HEMIARTHROPLASTY   . SHOULDER SURGERY  2005   Left    in for   No chief complaint on file.    HPI  [name]  is a 65 y.o. male, with past medical history of hypertension, hyperlipidemia, morbid obesity, OSA noncompliant with CPAP, [facility] hospital[facility] consulted for postoperative respiratory failure, patient had elective umbilical herniorrhaphy with mesh repair under general anesthesia, procedure was performed by Dr. Lovell Sheehan, and in PACU patient was noted to have some intermittent episodes of tachycardia, felt to be PSVT, no evidence of ventricular arrhythmias, patient was started on metoprolol and as needed IV Lopressor, postoperatively patient remained somnolent, lethargic, ABG was obtained which did show pH of 7.1, and PCO2 of 102, patient was on BiPAP, and transferred to ICU for further management, she is with no history of SA, apparently is  noncompliant with his CPAP, patient with mental status improvement while on BiPAP, as well ABG showing some improvement of his toes and hypercarbia.    Review of Systems    Is currently lethargic and unable to provide any review of systems  Social History Social History   Tobacco Use  . Smoking status: Current Every Day Smoker    Packs/day: 0.50    Years: 31.00    Pack years: 15.50    Types: Cigarettes  . Smokeless tobacco: Former Neurosurgeon  . Tobacco comment: smoking less than 1/2 a day (10/08/14)  Substance Use Topics  . Alcohol use: No    Alcohol/week: 0.0 standard drinks     Family History Family History  Problem Relation Age of Onset  . Heart disease Mother   . Heart disease Father   . Cancer Neg Hx     Prior to Admission medications   Medication Sig Start Date End Date Taking? Authorizing Provider  acetaminophen (TYLENOL) 500 MG tablet Take 1,000 mg by mouth every  6 (six) hours as needed for moderate pain.   Yes [provider]  albuterol (PROVENTIL HFA;VENTOLIN HFA) 108 (90 Base) MCG/ACT inhaler INHALE ONE TO TWO PUFFS BY MOUTH EVERY 6 HOURS AS NEEDED FOR WHEEZING OR SHORTNESS OF BREATH Patient taking differently: Inhale 2 puffs into the lungs every 6 (six) hours as needed for wheezing or shortness of breath. 05/30/18  Yes Romero BellingEllison, Sean, MD  ALPRAZolam Prudy Feeler(XANAX) 0.5 MG tablet Take 0.5 mg by mouth 4 (four) times daily as needed for anxiety.   Yes [provider]  amLODipine (NORVASC) 5 MG tablet Take 5 mg by mouth daily. 07/01/20  Yes [provider]  busPIRone (BUSPAR) 10 MG tablet Take 10 mg by mouth 3 (three) times daily. 06/18/20  Yes [provider]  Chlorpheniramine-DM (CORICIDIN HBP COUGH/COLD PO) Take 2 capsules by mouth daily as needed (cough).   Yes [provider]  HYDROcodone-acetaminophen (NORCO) 10-325 MG tablet Take 1 tablet by mouth every 8 (eight) hours as needed for moderate pain. 07/12/20  Yes [provider]   hydroxypropyl methylcellulose / hypromellose (ISOPTO TEARS / GONIOVISC) 2.5 % ophthalmic solution Place 1 drop into both eyes 3 (three) times daily as needed for dry eyes.   Yes [provider]  losartan (COZAAR) 100 MG tablet Take 1 tablet (100 mg total) by mouth daily. 03/23/18  Yes Romero BellingEllison, Sean, MD  lovastatin (MEVACOR) 40 MG tablet Take 1 tablet (40 mg total) by mouth daily with breakfast. 06/07/18  Yes Romero BellingEllison, Sean, MD  Multiple Vitamin (MULTIVITAMIN WITH MINERALS) TABS tablet Take 1 tablet by mouth daily.   Yes [provider]  torsemide (DEMADEX) 20 MG tablet Take 20 mg by mouth 2 (two) times daily. 07/10/20  Yes [provider]  ALPRAZolam (XANAX) 0.25 MG tablet TAKE ONE TABLET BY MOUTH THREE TIMES DAILY AS NEEDED FOR ANXIETY. Patient not taking: Reported on 07/26/2020 06/07/18   Romero BellingEllison, Sean, MD    Anti-infectives (From admission, onward)   Start     Dose/Rate Route Frequency Ordered Stop   08/09/20 1000  ceFAZolin (ANCEF) IVPB 2g/100 mL premix        2 g 200 mL/hr over 30 Minutes Intravenous On call to O.R. 08/09/20 0936 08/09/20 1111   08/09/20 1000  ceFAZolin (ANCEF) IVPB 1 g/50 mL premix        1 g 100 mL/hr over 30 Minutes Intravenous  Once 08/09/20 0949 08/09/20 1108      Scheduled Meds: . busPIRone  10 mg Oral TID  . [START ON 08/10/2020] enoxaparin (LOVENOX) injection  40 mg Subcutaneous Q24H  . metoprolol succinate  25 mg Oral Daily   Continuous Infusions: . sodium chloride 50 mL/hr at 08/09/20 1813  . potassium chloride     PRN Meds:.acetaminophen **OR** acetaminophen, albuterol, ALPRAZolam, diphenhydrAMINE **OR** diphenhydrAMINE, HYDROcodone-acetaminophen, hydroxypropyl methylcellulose / hypromellose, metoprolol tartrate, morphine injection, ondansetron **OR** ondansetron (ZOFRAN) IV, simethicone  No Known Allergies  Physical Exam  Vitals  Blood pressure (!) 90/48, pulse 81, temperature 98.4 F (36.9 C), resp. rate 17, height 6\' 4"   (1.93 m), weight (!) 171.8 kg, SpO2 99 %.   1. General morbidly obese male, laying in bed on BiPAP  2.  Status lethargic, but opens eyes to loud verbal stimuli, remains somnolent though, follows some simple commands .  3. No F.N deficits, ALL C.Nerves Intact, Strength 5/5 all 4 extremities, Sensation intact all 4 extremities, Plantars down going.  4. Ears and Eyes appear Normal, Conjunctivae clear.  5. Supple Neck, No  JVD, No cervical lymphadenopathy appriciated, No Carotid Bruits.  6. Symmetrical Chest wall movement, diminished air entry at the bases  7. RRR, No Gallops, Rubs or Murmurs, No Parasternal Heave.  8. Positive Bowel Sounds, Abdomen Soft, Jekyll site bandaged  9.  No Cyanosis, Normal Skin Turgor, No Skin Rash or Bruise.  10.  joints appear normal , no effusions, Normal ROM.  11. No Palpable Lymph Nodes in Neck or Axillae   Data Review  CBC Recent Labs  Lab 08/08/20 1002  WBC 9.0  HGB 15.2  HCT 47.0  PLT 195  MCV 93.8  MCH 30.3  MCHC 32.3  RDW 14.2  LYMPHSABS 2.1  MONOABS 0.5  EOSABS 0.2  BASOSABS 0.1   ------------------------------------------------------------------------------------------------------------------  Chemistries  Recent Labs  Lab 08/08/20 1002  NA 140  K 3.5  CL 97*  CO2 31  GLUCOSE 108*  BUN 12  CREATININE 0.73  CALCIUM 8.6*   ------------------------------------------------------------------------------------------------------------------ estimated creatinine clearance is 159.4 mL/min (by C-G formula based on SCr of 0.73 mg/dL). ------------------------------------------------------------------------------------------------------------------ No results for input(s): TSH, T4TOTAL, T3FREE, THYROIDAB in the last 72 hours.  Invalid input(s): FREET3   Coagulation profile No results for input(s): INR, PROTIME in the last 168  hours. ------------------------------------------------------------------------------------------------------------------- No results for input(s): DDIMER in the last 72 hours. -------------------------------------------------------------------------------------------------------------------  Cardiac Enzymes No results for input(s): CKMB, TROPONINI, MYOGLOBIN in the last 168 hours.  Invalid input(s): CK ------------------------------------------------------------------------------------------------------------------ Invalid input(s): POCBNP   ---------------------------------------------------------------------------------------------------------------  Urinalysis    Component Value Date/Time   COLORURINE YELLOW 04/01/2015 0829   APPEARANCEUR CLEAR 04/01/2015 0829   LABSPEC 1.020 04/01/2015 0829   PHURINE 6.0 04/01/2015 0829   GLUCOSEU NEGATIVE 04/01/2015 0829   HGBUR SMALL (A) 04/01/2015 0829   BILIRUBINUR NEGATIVE 04/01/2015 0829   KETONESUR NEGATIVE 04/01/2015 0829   PROTEINUR NEG 03/26/2014 0817   UROBILINOGEN 0.2 04/01/2015 0829   NITRITE NEGATIVE 04/01/2015 0829   LEUKOCYTESUR NEGATIVE 04/01/2015 0829     Imaging results:   No results found.   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 -Initial ABG showing severe hypercarbia, and respiratory acidosis with pH of 7.1, and PCO2 of 102, this has some improvement on BiPAP for couple hours with improvement in his blood gas. -Keep him n.p.o. overnight well keep on BiPAP, will repeat ABG in a.m., when he is more awake and appropriate he will need continuous encouragement to sit and use  incentive spirometer.  Acute metabolic encephalopathy -Secondary to hypercarbia, he is more awake currently after being on BiPAP for couple hours, continue to monitor  Hypertension -Blood pressure is soft, will hold his Norvasc, torsemide and Cozaar, discussed with staff to minimize his pain medications  Hyperlipidemia -Resume statin when stable  Morbid obesity with likely underlying OHS, as well with known untreated OSA -Patient will need to follow with sleep medicine as an outpatient regarding sleep study, as he will need CPAP or BiPAP  PSVT -Management per cardiology, started on beta-blockers, calcium 3.6, will give 20 mg of IV potassium  Elective umbilical herniorrhaphy with mesh repair under general anesthesia -Management per primary surgical team   DVT Prophylaxis per primary team.  AM Labs Ordered, also please review Full Orders   Thank you for the  consult, we will follow the patient with you in the Hospital.   Huey Bienenstock M.D on 08/09/2020 at 8:12 PM  Between 7am to 7pm  After 7pm go to [email or web address] -   Thank you for the consult, we will follow the patient with you in the Hospital.   [facility]   Office  [phone]

## 2020-08-09 NOTE — Progress Notes (Signed)
Nephew [employer] as contact notified patient will be admitted. He will contact patient significant other Charolotte Capuchin [phone] and inform her of admission. No bed assignment as of the time of phone call. Nephew verbalized understanding.

## 2020-08-09 NOTE — Interval H&P Note (Signed)
History and Physical Interval Note:  08/09/2020 10:25 AM  [name]  has presented today for surgery, with the diagnosis of Umbilical Hernia.  The various methods of treatment have been discussed with the patient and family. After consideration of risks, benefits and other options for treatment, the patient has consented to  Procedure(s): HERNIA REPAIR UMBILICAL ADULT (N/A) as a surgical intervention.  The patient's history has been reviewed, patient examined, no change in status, stable for surgery.  I have reviewed the patient's chart and labs.  Questions were answered to the patient's satisfaction.     Franky Macho

## 2020-08-09 NOTE — Progress Notes (Signed)
Called Dr Alva Garnet to see pt as he is very difficult to wake up and falls right back to sleep. Order placed for ABG's Resp therapy has been contacted by [facility] as dr Alva Garnet has orderd bipap also

## 2020-08-09 NOTE — Progress Notes (Signed)
Critical value, pH 7.158 and pco2 is 102. Dr Lovell Sheehan aware. RT at bedside. bipap numbers adjusted per RT.

## 2020-08-09 NOTE — Discharge Instructions (Signed)
Open Hernia Repair, Adult, Care After The following information offers guidance on how to care for yourself after your procedure. Your health care provider may also give you more specific instructions. If you have problems or questions, contact your health care provider. What can I expect after the procedure? After the procedure, it is common to have:  Mild discomfort.  Slight bruising.  Minor swelling.  Pain in the abdomen.  A small amount of blood from the incision. Follow these instructions at home: Medicines  Take over-the-counter and prescription medicines only as told by your health care provider.  Ask your health care provider if the medicine prescribed to you: ? Requires you to avoid driving or using machinery. ? Can cause constipation. You may need to take these actions to prevent or treat constipation:  Drink enough fluid to keep your urine pale yellow.  Take over-the-counter or prescription medicines.  Eat foods that are high in fiber, such as beans, whole grains, and fresh fruits and vegetables.  Limit foods that are high in fat and processed sugars, such as fried or sweet foods.  If you were prescribed an antibiotic medicine, take it as told by your health care provider. Do not stop using the antibiotic even if you start to feel better. Incision care  Follow instructions from your health care provider about how to take care of your incision. Make sure you: ? Wash your hands with soap and water for at least 20 seconds before and after you change your bandage (dressing). If soap and water are not available, use hand sanitizer. ? Change your dressing as told by your health care provider. ? Leave stitches (sutures), skin glue, or adhesive strips in place. These skin closures may need to stay in place for 2 weeks or longer. If adhesive strip edges start to loosen and curl up, you may trim the loose edges. Do not remove adhesive strips completely unless your health care  provider tells you to do that.  Check your incision area every day for signs of infection. Check for: ? More redness, swelling, or pain. ? More fluid or blood. ? Warmth. ? Pus or a bad smell.  Wear loose, soft clothing while your incision heals.   Activity  Rest as told by your health care provider.  Do not lift anything that is heavier than 10 lb (4.5 kg), or the limit that you are told, until your health care provider says that it is safe.  Do not play contact sports until your health care provider says that this is safe.  If you were given a sedative during the procedure, it can affect you for several hours. Do not drive or operate machinery until your health care provider says that it is safe.  Return to your normal activities as told by your health care provider. Ask your health care provider what activities are safe for you.   General instructions  Do not take baths, swim, or use a hot tub until your health care provider approves. Ask your health care provider if you may take showers. You may only be allowed to take sponge baths.  Hold a pillow over your abdomen when you cough or sneeze. This helps with pain.  Do not use any products that contain nicotine or tobacco. These products include cigarettes, chewing tobacco, and vaping devices, such as e-cigarettes. If you need help quitting, ask your health care provider.  Keep all follow-up visits. This is important. Contact a health care provider if:  You  have any of these signs of infection: ? More redness, swelling, or pain around your incision. ? More fluid or blood coming from your incision. ? Warmth coming from your incision. ? Pus or a bad smell coming from your incision. ? A fever or chills.  You have blood in your stool (feces).  You have not had a bowel movement in 2-3 days.  Your pain is not controlled with medicine. Get help right away if:  You have chest pain or shortness of breath.  You feel faint or  light-headed.  You have severe pain.  You vomit and your pain is worse.  You have pain, swelling, or redness in a leg. These symptoms may represent a serious problem that is an emergency. Do not wait to see if the symptoms will go away. Get medical help right away. Call your local emergency services (911 in the [place] Do not drive yourself to the hospital. Summary  After an open hernia repair, it is common to have mild discomfort, slight bruising, and minor swelling.  Follow instructions from your health care provider about how to take care of your incision. Check every day for signs of infection.  Do not lift heavy objects or play contact sports until your health care provider says it is safe.  Return to your normal activities as told by your health care provider. Ask your health care provider what activities are safe for you. This information is not intended to replace advice given to you by your health care provider. Make sure you discuss any questions you have with your health care provider. Document Revised: 12/25/2019 Document Reviewed: 12/25/2019 Elsevier Patient Education  2021 Elsevier Inc.     [facility] THE [place] [facility] [place] UNTIL Tuesday August 13, 2020. DO NOT USE ADDITIONAL NUMBING MEDICATIONS WITHOUT CONSULTING A PHYSICIAN  Bupivacaine Liposomal Suspension for Injection What is this medicine? BUPIVACAINE LIPOSOMAL (bue PIV a kane LIP oh som al) is an anesthetic. It causes loss of feeling in the skin or other tissues. It is used to prevent and to treat pain from some procedures. This medicine may be used for other purposes; ask your health care provider or pharmacist if you have questions. COMMON BRAND NAME(S): EXPAREL What should I tell my health care provider before I take this medicine? They need to know if you have any of these conditions:  G6PD deficiency  heart disease  kidney disease  liver disease  low blood pressure  lung or breathing disease,  like asthma  an unusual or allergic reaction to bupivacaine, other medicines, foods, dyes, or preservatives  pregnant or trying to get pregnant  breast-feeding How should I use this medicine? This medicine is injected into the affected area. It is given by a health care provider in a hospital or clinic setting. Talk to your health care provider about the use of this medicine in children. While it may be given to children as young as 6 years for selected conditions, precautions do apply. Overdosage: If you think you have taken too much of this medicine contact a poison control center or emergency room at once. NOTE: This medicine is only for you. Do not share this medicine with others. What if I miss a dose? This does not apply. What may interact with this medicine? This medicine may interact with the following medications:  acetaminophen  certain antibiotics like dapsone, nitrofurantoin, aminosalicylic acid, sulfonamides  certain medicines for seizures like phenobarbital, phenytoin, valproic acid  chloroquine  cyclophosphamide  flutamide  hydroxyurea  ifosfamide  metoclopramide  nitric oxide  nitroglycerin  nitroprusside  nitrous oxide  other local anesthetics like lidocaine, pramoxine, tetracaine  primaquine  quinine  rasburicase  sulfasalazine This list may not describe all possible interactions. Give your health care provider a list of all the medicines, herbs, non-prescription drugs, or dietary supplements you use. Also tell them if you smoke, drink alcohol, or use illegal drugs. Some items may interact with your medicine. What should I watch for while using this medicine? Your condition will be monitored carefully while you are receiving this medicine. Be careful to avoid injury while the area is numb, and you are not aware of pain. What side effects may I notice from receiving this medicine? Side effects that you should report to your doctor or health  care professional as soon as possible:  allergic reactions like skin rash, itching or hives, swelling of the face, lips, or tongue  seizures  signs and symptoms of a dangerous change in heartbeat or heart rhythm like chest pain; dizziness; fast, irregular heartbeat; palpitations; feeling faint or lightheaded; falls; breathing problems  signs and symptoms of methemoglobinemia such as pale, gray, or blue colored skin; headache; fast heartbeat; shortness of breath; feeling faint or lightheaded, falls; tiredness Side effects that usually do not require medical attention (report to your doctor or health care professional if they continue or are bothersome):  anxious  back pain  changes in taste  changes in vision  constipation  dizziness  fever  nausea, vomiting This list may not describe all possible side effects. Call your doctor for medical advice about side effects. You may report side effects to FDA at 1-800-FDA-1088. Where should I keep my medicine? This drug is given in a hospital or clinic and will not be stored at home. NOTE: This sheet is a summary. It may not cover all possible information. If you have questions about this medicine, talk to your doctor, pharmacist, or health care provider.  2021 Elsevier/Gold Standard (2019-08-17 12:24:57)      General Anesthesia, Adult, Care After This sheet gives you information about how to care for yourself after your procedure. Your health care provider may also give you more specific instructions. If you have problems or questions, contact your health care provider. What can I expect after the procedure? After the procedure, the following side effects are common:  Pain or discomfort at the IV site.  Nausea.  Vomiting.  Sore throat.  Trouble concentrating.  Feeling cold or chills.  Feeling weak or tired.  Sleepiness and fatigue.  Soreness and body aches. These side effects can affect parts of the body that were not  involved in surgery. Follow these instructions at home: For the time period you were told by your health care provider:  Rest.  Do not participate in activities where you could fall or become injured.  Do not drive or use machinery.  Do not drink alcohol.  Do not take sleeping pills or medicines that cause drowsiness.  Do not make important decisions or sign legal documents.  Do not take care of children on your own.   Eating and drinking  Follow any instructions from your health care provider about eating or drinking restrictions.  When you feel hungry, start by eating small amounts of foods that are soft and easy to digest (bland), such as toast. Gradually return to your regular diet.  Drink enough fluid to keep your urine pale yellow.  If you vomit, rehydrate by drinking water,  juice, or clear broth. General instructions  If you have sleep apnea, surgery and certain medicines can increase your risk for breathing problems. Follow instructions from your health care provider about wearing your sleep device: ? Anytime you are sleeping, including during daytime naps. ? While taking prescription pain medicines, sleeping medicines, or medicines that make you drowsy.  Have a responsible adult stay with you for the time you are told. It is important to have someone help care for you until you are awake and alert.  Return to your normal activities as told by your health care provider. Ask your health care provider what activities are safe for you.  Take over-the-counter and prescription medicines only as told by your health care provider.  If you smoke, do not smoke without supervision.  Keep all follow-up visits as told by your health care provider. This is important. Contact a health care provider if:  You have nausea or vomiting that does not get better with medicine.  You cannot eat or drink without vomiting.  You have pain that does not get better with medicine.  You  are unable to pass urine.  You develop a skin rash.  You have a fever.  You have redness around your IV site that gets worse. Get help right away if:  You have difficulty breathing.  You have chest pain.  You have blood in your urine or stool, or you vomit blood. Summary  After the procedure, it is common to have a sore throat or nausea. It is also common to feel tired.  Have a responsible adult stay with you for the time you are told. It is important to have someone help care for you until you are awake and alert.  When you feel hungry, start by eating small amounts of foods that are soft and easy to digest (bland), such as toast. Gradually return to your regular diet.  Drink enough fluid to keep your urine pale yellow.  Return to your normal activities as told by your health care provider. Ask your health care provider what activities are safe for you. This information is not intended to replace advice given to you by your health care provider. Make sure you discuss any questions you have with your health care provider. Document Revised: 01/25/2020 Document Reviewed: 08/24/2019 Elsevier Patient Education  2021 [employer].

## 2020-08-09 NOTE — Op Note (Signed)
Patient:  [name]  DOB:  1956-04-04  MRN:  [id number]   Preop Diagnosis: Umbilical hernia, incarcerated  Postop Diagnosis: Same  Procedure: Umbilical herniorrhaphy with mesh (incarcerated)  Surgeon: Franky Macho, MD  Anes: General endotracheal  Indications: Patient is a 65 year old white male who presents with an umbilical hernia.  The risks and benefits of the procedure including bleeding, infection, mesh use, and the possibility of recurrence of the hernia were fully explained to the patient, who gave informed consent.  Procedure note: The patient was placed in supine position.  After induction of general endotracheal anesthesia, the abdomen was prepped and draped using the usual sterile technique with ChloraPrep.  Surgical site confirmation was performed.  I initially tried to reduce the umbilical hernia while the patient was asleep.  I was unable to do this.  An infraumbilical incision was made down to the fascia.  The umbilicus was freed away from the hernia sac.  The patient had only a 2 cm at the base the iliacus but had umbilicus but had a significant amount of omentum mushrooming through it.  No bowel was present.  In order to reduce the omentum, a LigaSure was used to come across the omentum at the base of the hernia.  The hernia sac was excised along with the omentum.  The omentum was then reduced into the abdominal cavity.  I palpated the ventral wall and no other hernia defects were identified in the surrounding area.  A 6.4 cm Bard Ventralax ST patch was then inserted and secured to the fascia using 0 Ethibond interrupted sutures.  The overlying fascia was reapproximated using 0 Ethibond interrupted sutures in a transverse pattern.  The base of the umbilicus was secured back to the fascia using a 2-0 Vicryl interrupted suture.  Along the inferior portion of the umbilicus, macerated skin was present and this was excised.  It was disposed of.  The subcutaneous layer was  reapproximated using a 3-0 Vicryl interrupted suture.  The skin was closed using staples.  0.5% Sensorcaine was instilled into the surrounding wound.  Betadine ointment and a dry sterile dressing were applied.  All tape and needle counts were correct at the end of the procedure.  The patient was extubated in the operating room and transferred to PACU in stable condition.  Complications: None  EBL: Minimal  Specimen: None

## 2020-08-09 NOTE — Progress Notes (Signed)
Dr Alva Garnet & resp therapy here trying to get ABGS & set up bipap. AC aware of needs and has been here to assess

## 2020-08-09 NOTE — Progress Notes (Signed)
  Date and time results received: 08/09/20 2005  Test: CO2  Critical Value: 72.7  Name of Provider Notified: Dr. Robb Matar

## 2020-08-09 NOTE — Anesthesia Preprocedure Evaluation (Addendum)
Anesthesia Evaluation  Patient identified by MRN, date of birth, ID band Patient awake    Reviewed: Allergy & Precautions, NPO status , Patient's Chart, lab work & pertinent test results  History of Anesthesia Complications Negative for: history of anesthetic complications  Airway Mallampati: III  TM Distance: >3 FB Neck ROM: Full    Dental  (+) Edentulous Upper, Edentulous Lower   Pulmonary shortness of breath, with exertion, at rest and lying, sleep apnea , Current Smoker,    Pulmonary exam normal breath sounds clear to auscultation       Cardiovascular Exercise Tolerance: Poor hypertension, Pt. on medications Normal cardiovascular exam Rhythm:Regular Rate:Normal  08-Aug-2020 08:42:00 [facility]-300 ROUTINE RECORD Sinus rhythm with Premature atrial complexes Otherwise normal ECG Confirmed by Julien Nordmann [phone]) on 08/08/2020 5:35:20 PM   Neuro/Psych Anxiety negative neurological ROS     GI/Hepatic negative GI ROS, Neg liver ROS, hiatal hernia,   Endo/Other  diabetes, Well Controlled, Type 2, Oral Hypoglycemic AgentsMorbid obesity  Renal/GU negative Renal ROS     Musculoskeletal  (+) Arthritis ,   Abdominal   Peds  Hematology   Anesthesia Other Findings Patient had brief episode of tachy/svt and  Back to NSR, was stable before transporting to OR.  Reproductive/Obstetrics                          Anesthesia Physical Anesthesia Plan  ASA: IV  Anesthesia Plan: General   Post-op Pain Management:    Induction: Intravenous  PONV Risk Score and Plan: 2 and Ondansetron and Dexamethasone  Airway Management Planned: Oral ETT  Additional Equipment:   Intra-op Plan:   Post-operative Plan: Extubation in OR  Informed Consent: I have reviewed the patients History and Physical, chart, labs and discussed the procedure including the risks, benefits and alternatives for the  proposed anesthesia with the patient or authorized representative who has indicated his/her understanding and acceptance.       Plan Discussed with: CRNA and Surgeon  Anesthesia Plan Comments:       Anesthesia Quick Evaluation

## 2020-08-10 DIAGNOSIS — J9602 Acute respiratory failure with hypercapnia: Secondary | ICD-10-CM

## 2020-08-10 DIAGNOSIS — R0902 Hypoxemia: Secondary | ICD-10-CM

## 2020-08-10 LAB — CBC
HCT: 44.6 % (ref 39.0–52.0)
Hemoglobin: 14.2 g/dL (ref 13.0–17.0)
MCH: 30.3 pg (ref 26.0–34.0)
MCHC: 31.8 g/dL (ref 30.0–36.0)
MCV: 95.3 fL (ref 80.0–100.0)
Platelets: 188 10*3/uL (ref 150–400)
RBC: 4.68 MIL/uL (ref 4.22–5.81)
RDW: 14.3 % (ref 11.5–15.5)
WBC: 10.5 10*3/uL (ref 4.0–10.5)
nRBC: 0 % (ref 0.0–0.2)

## 2020-08-10 LAB — BASIC METABOLIC PANEL
Anion gap: 11 (ref 5–15)
BUN: 19 mg/dL (ref 8–23)
CO2: 29 mmol/L (ref 22–32)
Calcium: 8.3 mg/dL — ABNORMAL LOW (ref 8.9–10.3)
Chloride: 99 mmol/L (ref 98–111)
Creatinine, Ser: 0.82 mg/dL (ref 0.61–1.24)
GFR, Estimated: >60 mL/min (ref >60)
Glucose, Bld: 138 mg/dL — ABNORMAL HIGH (ref 70–99)
Potassium: 3.9 mmol/L (ref 3.5–5.1)
Sodium: 139 mmol/L (ref 135–145)

## 2020-08-10 LAB — BLOOD GAS, ARTERIAL
Acid-Base Excess: 6.9 mmol/L — ABNORMAL HIGH (ref 0.0–2.0)
Bicarbonate: 29.7 mmol/L — ABNORMAL HIGH (ref 20.0–28.0)
FIO2: 40
O2 Saturation: 98.8 %
Patient temperature: 37
pCO2 arterial: 52.1 mmHg — ABNORMAL HIGH (ref 32.0–48.0)
pH, Arterial: 7.4 (ref 7.350–7.450)
pO2, Arterial: 132 mmHg — ABNORMAL HIGH (ref 83.0–108.0)

## 2020-08-10 LAB — MRSA PCR SCREENING: MRSA by PCR: NEGATIVE

## 2020-08-10 LAB — MAGNESIUM: Magnesium: 1.9 mg/dL (ref 1.7–2.4)

## 2020-08-10 MED ORDER — METOPROLOL SUCCINATE ER 25 MG PO TB24: 25.0000 mg | ORAL_TABLET | Freq: Every day | ORAL | 11 refills | Status: AC

## 2020-08-10 MED ORDER — POLYVINYL ALCOHOL 1.4 % OP SOLN: 1.0000 [drp] | OPHTHALMIC | Status: DC | PRN

## 2020-08-10 MED ORDER — CHLORHEXIDINE GLUCONATE CLOTH 2 % EX PADS
6.0000 | MEDICATED_PAD | Freq: Every day | CUTANEOUS | Status: DC
  Administered 2020-08-10: 6 via TOPICAL

## 2020-08-10 NOTE — Discharge Summary (Signed)
Physician Discharge Summary  Patient ID: [name] MRN: [id number] DOB/AGE: [birth year] 65 y.o.  Admit date: 08/09/2020 Discharge date: 08/10/2020  Admission Diagnoses: Umbilical hernia, morbid obesity, obstructive sleep apnea, paroxysmal supraventricular tachycardia  Discharge Diagnoses: Same Active Problems:   Umbilical hernia, incarcerated   Hypoxia   Respiratory failure (HCC) Hypercarbia  Discharged Condition: good  Hospital Course: Patient is a 65 year old morbidly obese white male who underwent an uneventful umbilical herniorrhaphy with mesh on 08/09/2020.  His postoperative course was remarkable for hypercarbia as well as proximal supraventricular tachycardia.  He was seen by cardiology in the recovery room and was placed on metoprolol XL 25 mg daily.  He was also noted to be hypercarbic.  In discussion with the patient, he states he was diagnosed with sleep apnea in the remote past but has not used his CPAP machine due to mechanical issues.  He was observed overnight on BiPAP.  His blood gases significantly improved.  This morning, he is fully alert and oriented.  He states he knows he has sleep apnea and will be following up with his primary care physician concerning getting a new CPAP machine.  Consults: cardiology and Hospitalist  Treatments: surgery: Umbilical herniorrhaphy with mesh on 08/09/2020  Discharge Exam: Blood pressure (!) 116/43, pulse 70, temperature 98.4 F (36.9 C), temperature source Oral, resp. rate 20, height 6\' 4"  (1.93 m), weight (!) 171.8 kg, SpO2 91 %. General appearance: alert, cooperative and no distress Resp: clear to auscultation bilaterally Cardio: regular rate and rhythm, S1, S2 normal, no murmur, click, rub or gallop GI: Soft, incision healing well.  Disposition: Discharge disposition: 01-Home or Self Care       Discharge Instructions    Diet - low sodium heart healthy   Complete by: As directed    Increase activity slowly   Complete  by: As directed    Increase activity slowly   Complete by: As directed      Allergies as of 08/10/2020   No Known Allergies     Medication List    TAKE these medications   acetaminophen 500 MG tablet Commonly known as: TYLENOL Take 1,000 mg by mouth every 6 (six) hours as needed for moderate pain.   albuterol 108 (90 Base) MCG/ACT inhaler Commonly known as: VENTOLIN HFA INHALE ONE TO TWO PUFFS BY MOUTH EVERY 6 HOURS AS NEEDED FOR WHEEZING OR SHORTNESS OF BREATH What changed: See the new instructions.   ALPRAZolam 0.5 MG tablet Commonly known as: XANAX Take 0.5 mg by mouth 4 (four) times daily as needed for anxiety. What changed: Another medication with the same name was removed. Continue taking this medication, and follow the directions you see here.   amLODipine 5 MG tablet Commonly known as: NORVASC Take 5 mg by mouth daily.   busPIRone 10 MG tablet Commonly known as: BUSPAR Take 10 mg by mouth 3 (three) times daily.   CORICIDIN HBP COUGH/COLD PO Take 2 capsules by mouth daily as needed (cough).   HYDROcodone-acetaminophen 10-325 MG tablet Commonly known as: NORCO Take 1 tablet by mouth every 8 (eight) hours as needed for moderate pain.   hydroxypropyl methylcellulose / hypromellose 2.5 % ophthalmic solution Commonly known as: ISOPTO TEARS / GONIOVISC Place 1 drop into both eyes 3 (three) times daily as needed for dry eyes.   losartan 100 MG tablet Commonly known as: COZAAR Take 1 tablet (100 mg total) by mouth daily.   lovastatin 40 MG tablet Commonly known as: MEVACOR Take 1 tablet (40  mg total) by mouth daily with breakfast.   metoprolol succinate 25 MG 24 hr tablet Commonly known as: Toprol XL Take 1 tablet (25 mg total) by mouth daily.   multivitamin with minerals Tabs tablet Take 1 tablet by mouth daily.   torsemide 20 MG tablet Commonly known as: DEMADEX Take 20 mg by mouth 2 (two) times daily.       Follow-up Information    Franky Macho,  MD On 08/22/2020.   Specialty: General Surgery Contact information: [address] [place] [address] [phone]               Signed: Franky Macho 08/10/2020, 9:25 AM

## 2020-08-10 NOTE — Progress Notes (Signed)
Patient taken off BIPAP and placed on 2 lpm nasal cannula.

## 2020-08-10 NOTE — Addendum Note (Signed)
Addendum  created 08/10/20 0914 by Molli Barrows, MD   Clinical Note Signed

## 2020-08-10 NOTE — Anesthesia Postprocedure Evaluation (Signed)
Anesthesia Post Note  Patient: [name]  Procedure(s) Performed: HERNIA REPAIR UMBILICAL ADULT (N/A )  Patient location during evaluation: ICU Anesthesia Type: General Level of consciousness: awake and alert and oriented Pain management: pain level controlled Vital Signs Assessment: post-procedure vital signs reviewed and stable Respiratory status: spontaneous breathing and respiratory function stable Cardiovascular status: blood pressure returned to baseline and stable Postop Assessment: no apparent nausea or vomiting Anesthetic complications: no Comments: Patient is awake, alert, and oriented, not on BIPAP. Patient will be discharged to home   No complications documented.   Last Vitals:  Vitals:   08/10/20 0700 08/10/20 0817  BP: (!) 116/43   Pulse: 70   Resp: 20   Temp:  36.9 C  SpO2: 91%     Last Pain:  Vitals:   08/10/20 0817  TempSrc: Oral  PainSc:                  Rajamani C Battula

## 2020-08-10 NOTE — Progress Notes (Signed)
Patient very anxious asking for a xanax.  Administered 0.5mg  Xanax PO as per MD orders.

## 2020-08-10 NOTE — Progress Notes (Signed)
Patient sates he feels much better less anxious.  Patient given discharge instruction.  Instructed to call cardiology to follow up with an appointment in 2 to 3 weeks.  IV removed tip intact.

## 2020-08-10 NOTE — Progress Notes (Signed)
PROGRESS NOTE    [name]  CHE:[id number] DOB: 1955-08-09 DOA: 08/09/2020 PCP: Rebekah Chesterfield, NP   No chief complaint on file.   Brief admission narrative:  As per H&P written by Dr. Randol Kern on 08/09/2020.  Briefly consultation made by general surgery secondary to acute respiratory failure with hypercapnia due to obesity hypoventilation syndrome and untreated sleep apnea.  Assessment & Plan: 1-umbilical hernia, incarcerated -Surgically repair and is stable -Further treatment and recommendations post surgery per primary team.  2-acute respiratory failure with hypoxia and hypercapnia -In the setting of obesity hypoventilation syndrome and untreated obstructive sleep apnea -Process successfully controlled and reversed after the use of BiPAP. -Patient will need outpatient sleep study in order to receive updated pressure water on his CPAP machine and a new mask in order to continue treatment for obstructive sleep apnea. -Advised to lose weight.  3-morbid obesity -Body mass index is 46.1 kg/m. -Low calorie diet, portion control and increase physical activity discussed with patient -Underlying obesity hypoventilation syndrome and obstructive sleep apnea as mentioned above presumed.  4-paroxysmal supraventricular tachycardia -Continue to follow electrolytes and further replete as needed to maintain the stability -Continue beta-blocker as instructed by cardiology service -Patient high risk for arrhythmia in the setting of untreated OSA.  5-hyperlipidemia -Continue statins.  6-hypertension -Overall stable -Antihypertensive agents -Advised to follow heart healthy diet.  7-acute metabolic encephalopathy in the setting of hypercapnia -Resolve after using BiPAP -Oriented x4 and ready for discharge from internal medicine standpoint.   Respiratory failure (HCC)  Code Status: Full code Family Communication: No family at bedside. Disposition:   Status is:  Inpatient  Dispo: The patient is from: Home              Anticipated d/c is to: Home              Patient currently medically stable for discharge from internal medicine standpoint.   Difficult to place patient no   Procedures:  Status post umbilical herniorrhaphy with mesh repair under general anesthesia 08/09/2020   Antimicrobials:  None   Subjective: Oriented x4; no chest pain, no nausea, no vomiting, no palpitations.  Reports breathing is at baseline would like to go home.  Objective: Vitals:   08/10/20 0500 08/10/20 0600 08/10/20 0700 08/10/20 0817  BP: (!) 91/44 (!) 117/55 (!) 116/43   Pulse: 70 72 70   Resp: 18 (!) 21 20   Temp:    98.4 F (36.9 C)  TempSrc:    Oral  SpO2: 98% 91% 91%   Weight:      Height:        Intake/Output Summary (Last 24 hours) at 08/10/2020 5361 Last data filed at 08/10/2020 0500 Gross per 24 hour  Intake 1039.16 ml  Output 385 ml  Net 654.16 ml   Filed Weights   08/09/20 0959 08/09/20 1758  Weight: (!) 167.8 kg (!) 171.8 kg    Examination:  General exam: Appears calm and comfortable, reports some pain in his abdomen (around umbilical area) and also chronic left shoulder pain. No nausea, no vomiting, no CP. Wants to go home. Respiratory system: Respiratory effort normal. No wheezing, no crackles, no using accessory muscles.  Cardiovascular system: RRR. Unable to assess JVD with body habitus. No murmurs, rubs, gallops or clicks.  Gastrointestinal system: Abdomen is obese, soft, no guarding, clean and dry incision around belly button. Positive BS. Central nervous system: Alert and oriented. No focal neurological deficits. Extremities: no cyanosis, no clubbing.  Skin: No  petechiae.  Psychiatry: Judgement and insight appear normal. Mood & affect appropriate.     Data Reviewed: I have personally reviewed following labs and imaging studies  CBC: Recent Labs  Lab 08/08/20 1002 08/10/20 0355  WBC 9.0 10.5  NEUTROABS 6.1  --   HGB  15.2 14.2  HCT 47.0 44.6  MCV 93.8 95.3  PLT 195 188    Basic Metabolic Panel: Recent Labs  Lab 08/08/20 1002 08/10/20 0355  NA 140 139  K 3.5 3.9  CL 97* 99  CO2 31 29  GLUCOSE 108* 138*  BUN 12 19  CREATININE 0.73 0.82  CALCIUM 8.6* 8.3*  MG  --  1.9    GFR: Estimated Creatinine Clearance: 155.5 mL/min (by C-G formula based on SCr of 0.82 mg/dL).  CBG: No results for input(s): GLUCAP in the last 168 hours.   Recent Results (from the past 240 hour(s))  SARS CORONAVIRUS 2 (TAT 6-24 HRS) Nasopharyngeal Nasopharyngeal Swab     Status: None   Collection Time: 08/08/20  8:49 AM   Specimen: Nasopharyngeal Swab  Result Value Ref Range Status   SARS Coronavirus 2 NEGATIVE NEGATIVE Final    Comment: (NOTE) SARS-CoV-2 target nucleic acids are NOT DETECTED.  The SARS-CoV-2 RNA is generally detectable in upper and lower respiratory specimens during the acute phase of infection. Negative results do not preclude SARS-CoV-2 infection, do not rule out co-infections with other pathogens, and should not be used as the sole basis for treatment or other patient management decisions. Negative results must be combined with clinical observations, patient history, and epidemiological information. The expected result is Negative.  Fact Sheet for Patients: HairSlick.no  Fact Sheet for Healthcare Providers: quierodirigir.com  This test is not yet approved or cleared by the [place] FDA and  has been authorized for detection and/or diagnosis of SARS-CoV-2 by FDA under an Emergency Use Authorization (EUA). This EUA will remain  in effect (meaning this test can be used) for the duration of the COVID-19 declaration under Se ction 564(b)(1) of the Act, 21 U.S.C. section 360bbb-3(b)(1), unless the authorization is terminated or revoked sooner.  Performed at [facility], [address], [place], [place] [address]   MRSA  PCR Screening     Status: None   Collection Time: 08/09/20  5:51 PM   Specimen: Nasal Mucosa; Nasopharyngeal  Result Value Ref Range Status   MRSA by PCR NEGATIVE NEGATIVE Final    Comment:        The GeneXpert MRSA Assay (FDA approved for NASAL specimens only), is one component of a comprehensive MRSA colonization surveillance program. It is not intended to diagnose MRSA infection nor to guide or monitor treatment for MRSA infections. Performed at [facility], [address], [place], [place] [address]      Radiology Studies: No results found.   Scheduled Meds: . busPIRone  10 mg Oral TID  . Chlorhexidine Gluconate Cloth  6 each Topical Daily  . enoxaparin (LOVENOX) injection  40 mg Subcutaneous Q24H  . metoprolol succinate  25 mg Oral Daily   Continuous Infusions: . sodium chloride 50 mL/hr at 08/10/20 0417     LOS: 1 day    Time spent: 30 minutes    Vassie Loll, MD [facility]   To contact the attending provider between 7A-7P or the covering provider during after hours 7P-7A, please log into the web site [email or web address] and access using universal [place] password for that web site. If you do not have  the password, please call the hospital operator.  08/10/2020, 9:38 AM

## 2020-08-13 ENCOUNTER — Encounter (HOSPITAL_COMMUNITY): Payer: Self-pay | Admitting: General Surgery

## 2020-08-22 ENCOUNTER — Ambulatory Visit (INDEPENDENT_AMBULATORY_CARE_PROVIDER_SITE_OTHER): Payer: Self-pay | Admitting: General Surgery

## 2020-08-22 ENCOUNTER — Other Ambulatory Visit: Payer: Self-pay

## 2020-08-22 ENCOUNTER — Encounter: Payer: Self-pay | Admitting: General Surgery

## 2020-08-22 VITALS — BP 128/81 | HR 75 | Temp 97.1°F | Resp 20 | Ht 76.0 in | Wt 370.0 lb

## 2020-08-22 DIAGNOSIS — Z09 Encounter for follow-up examination after completed treatment for conditions other than malignant neoplasm: Secondary | ICD-10-CM

## 2020-08-22 NOTE — Progress Notes (Signed)
Subjective:     [name]  Patient here for wound check, status post umbilical herniorrhaphy with mesh.  He states he is doing well.  He is having some leakage of clear yellow fluid.  He denies any fever or chills.  He denies any pus draining from the wound. Objective:    BP 128/81   Pulse 75   Temp (!) 97.1 F (36.2 C)   Resp 20   Ht 6\' 4"  (1.93 m)   Wt (!) 370 lb (167.8 kg)   SpO2 92%   BMI 45.04 kg/m   General:  alert, cooperative and no distress  The umbilical incision is healing okay with some seromatous drainage from the left lateral aspect of the incision line.  No purulent drainage is present.  One half of the staples were removed.     Assessment:    Patient healing as well as expected due to the large hernia he had.  There is no evidence of infection.      Plan:   Keep the wound clean and dry.  Expect some ongoing seromatous drainage.  Follow-up here on 09/03/2020.

## 2020-09-03 ENCOUNTER — Ambulatory Visit (INDEPENDENT_AMBULATORY_CARE_PROVIDER_SITE_OTHER): Payer: 59 | Admitting: General Surgery

## 2020-09-03 ENCOUNTER — Other Ambulatory Visit: Payer: Self-pay

## 2020-09-03 ENCOUNTER — Encounter: Payer: Self-pay | Admitting: General Surgery

## 2020-09-03 VITALS — BP 116/68 | HR 71 | Temp 97.8°F | Resp 18 | Ht 76.0 in | Wt 371.0 lb

## 2020-09-03 DIAGNOSIS — Z09 Encounter for follow-up examination after completed treatment for conditions other than malignant neoplasm: Secondary | ICD-10-CM

## 2020-09-03 NOTE — Progress Notes (Signed)
Subjective:     [name]  Here for wound check.  Patient states the drainage has subsided.  He denies any fevers or chills. Objective:    BP 116/68   Pulse 71   Temp 97.8 F (36.6 C) (Other (Comment))   Resp 18   Ht 6\' 4"  (1.93 m)   Wt (!) 371 lb (168.3 kg)   SpO2 92%   BMI 45.16 kg/m   General:  alert, cooperative and no distress  Incision healing well.  Remaining staples removed.     Assessment:    Doing well postoperatively.    Plan:   May resume normal activity.  Follow-up here as needed.

## 2020-09-27 ENCOUNTER — Ambulatory Visit (INDEPENDENT_AMBULATORY_CARE_PROVIDER_SITE_OTHER): Payer: 59 | Admitting: Cardiology

## 2020-09-27 ENCOUNTER — Encounter: Payer: Self-pay | Admitting: Cardiology

## 2020-09-27 VITALS — BP 108/70 | HR 82 | Ht 76.0 in | Wt 375.4 lb

## 2020-09-27 DIAGNOSIS — G4733 Obstructive sleep apnea (adult) (pediatric): Secondary | ICD-10-CM

## 2020-09-27 DIAGNOSIS — I471 Supraventricular tachycardia: Secondary | ICD-10-CM | POA: Diagnosis not present

## 2020-09-27 NOTE — Patient Instructions (Addendum)
Medication Instructions:  Your physician recommends that you continue on your current medications as directed. Please refer to the Current Medication list given to you today.  Labwork: none  Testing/Procedures: none  Follow-Up: Your physician recommends that you schedule a follow-up appointment in: 6 months  Any Other Special Instructions Will Be Listed Below (If Applicable). You have been referred to [facility]  If you need a refill on your cardiac medications before your next appointment, please call your pharmacy. 

## 2020-09-27 NOTE — Progress Notes (Signed)
Cardiology Office Note  Date: 09/27/2020   ID: Niall, Illes 11/21/1955, MRN [id number]  PCP:  Rebekah Chesterfield, NP  Cardiologist:  Nona Dell, MD Electrophysiologist:  None   Chief Complaint  Patient presents with  . Follow-up PSVT    History of Present Illness: [name] is a 65 y.o. male presenting for post hospital follow-up.  He was seen in consultation back in March following elective umbilical herniorrhaphy with mesh repair under general anesthesia by Dr. Lovell Sheehan.  While in the PACU he experienced episodes of PSVT that improved with IV beta-blocker.  He was admitted for observation overnight given slow recovery and hypoxia with history of OSA untreated with CPAP and morbid obesity.  His PCP is Mr. Milinda Cave NP.  He presents for a follow-up visit, states that he has been doing very well.  No palpitations and tolerating Toprol-XL 25 mg daily.  I talked with him about his sleep apnea.  He has not used CPAP for many years, has an old machine.  We discussed getting him back in for a follow-up sleep medicine evaluation with [facility] in [place].  Past Medical History:  Diagnosis Date  . Allergic rhinitis   . Arthritis    Avascular necrosis of L shoulder, knees  . Asthma   . ED (erectile dysfunction)   . Essential hypertension   . H/O hiatal hernia   . History of shingles   . Hyperlipidemia   . PSVT (paroxysmal supraventricular tachycardia) (HCC)   . Sleep apnea    Does not use CPAP  . Type 2 diabetes mellitus (HCC)     Past Surgical History:  Procedure Laterality Date  . Fx Humerus     Left- 2005, required 2 surgeries   . SHOULDER HEMI-ARTHROPLASTY  04/11/2012   Procedure: SHOULDER HEMI-ARTHROPLASTY;  Surgeon: [name], MD;  Location: [facility] OR;  Service: Orthopedics;  Laterality: Left;  LEFT SHOULDER HEMIARTHROPLASTY   . SHOULDER SURGERY  2005   Left  . UMBILICAL HERNIA REPAIR N/A 08/09/2020   Procedure: HERNIA REPAIR  UMBILICAL ADULT;  Surgeon: Franky Macho, MD;  Location: [facility];  Service: General;  Laterality: N/A;    Current Outpatient Medications  Medication Sig Dispense Refill  . acetaminophen (TYLENOL) 500 MG tablet Take 1,000 mg by mouth every 6 (six) hours as needed for moderate pain.    Marland Kitchen albuterol (PROVENTIL HFA;VENTOLIN HFA) 108 (90 Base) MCG/ACT inhaler INHALE ONE TO TWO PUFFS BY MOUTH EVERY 6 HOURS AS NEEDED FOR WHEEZING OR SHORTNESS OF BREATH (Patient taking differently: Inhale 2 puffs into the lungs every 6 (six) hours as needed for wheezing or shortness of breath.) 18 g 0  . ALPRAZolam (XANAX) 0.5 MG tablet Take 0.5 mg by mouth 4 (four) times daily as needed for anxiety.    Marland Kitchen amLODipine (NORVASC) 5 MG tablet Take 5 mg by mouth daily.    . busPIRone (BUSPAR) 10 MG tablet Take 10 mg by mouth 3 (three) times daily.    . Chlorpheniramine-DM (CORICIDIN HBP COUGH/COLD PO) Take 2 capsules by mouth daily as needed (cough).    Marland Kitchen HYDROcodone-acetaminophen (NORCO) 10-325 MG tablet Take 1 tablet by mouth every 8 (eight) hours as needed for moderate pain.    . hydroxypropyl methylcellulose / hypromellose (ISOPTO TEARS / GONIOVISC) 2.5 % ophthalmic solution Place 1 drop into both eyes 3 (three) times daily as needed for dry eyes.    Marland Kitchen losartan (COZAAR) 100 MG tablet Take 1 tablet (100 mg total)  by mouth daily. 90 tablet 3  . lovastatin (MEVACOR) 40 MG tablet Take 1 tablet (40 mg total) by mouth daily with breakfast. 30 tablet 0  . metoprolol succinate (TOPROL XL) 25 MG 24 hr tablet Take 1 tablet (25 mg total) by mouth daily. 30 tablet 11  . Multiple Vitamin (MULTIVITAMIN WITH MINERALS) TABS tablet Take 1 tablet by mouth daily.    Marland Kitchen torsemide (DEMADEX) 20 MG tablet Take 20 mg by mouth 2 (two) times daily.     No current facility-administered medications for this visit.   Allergies:  Patient has no known allergies.   ROS: No dizziness or syncope.  Physical Exam: VS:  BP 108/70   Pulse 82   Ht 6\' 4"   (1.93 m)   Wt (!) 375 lb 6.4 oz (170.3 kg)   SpO2 96%   BMI 45.70 kg/m , BMI Body mass index is 45.7 kg/m.  Wt Readings from Last 3 Encounters:  09/27/20 (!) 375 lb 6.4 oz (170.3 kg)  09/03/20 (!) 371 lb (168.3 kg)  08/22/20 (!) 370 lb (167.8 kg)    General: Patient appears comfortable at rest. HEENT: Conjunctiva and lids normal, wearing a mask. Neck: Supple, increased girth, difficult to assess JVP. Lungs: Decreased breath sounds, nonlabored breathing at rest. Cardiac: Regular rate and rhythm, no S3 or significant systolic murmur, no pericardial rub.  ECG:  An ECG dated 08/09/2020 was personally reviewed today and demonstrated:  SVT with nonspecific ST changes and lead motion artifact.  Recent Labwork: 08/10/2020: BUN 19; Creatinine, Ser 0.82; Hemoglobin 14.2; Magnesium 1.9; Platelets 188; Potassium 3.9; Sodium 139     Component Value Date/Time   CHOL 122 07/14/2016 1021   TRIG 90.0 07/14/2016 1021   HDL 42.90 07/14/2016 1021   CHOLHDL 3 07/14/2016 1021   VLDL 18.0 07/14/2016 1021   LDLCALC 61 07/14/2016 1021   LDLDIRECT 80 03/26/2014 0817    Other Studies Reviewed Today:  Echocardiogram 02/16/2012: - Left ventricle: The cavity size was mildly dilated. Wall  thickness was normal. Systolic function was normal. The  estimated ejection fraction was in the range of 55% to  60%. Features are consistent with a pseudonormal left  ventricular filling pattern, with concomitant abnormal  relaxation and increased filling pressure (grade 2  diastolic dysfunction).  - Left atrium: The atrium was mildly dilated.   Impressions:   - Extremely limited due to poor sound wave transmission; LV  function appears to be preserved; focal wall motion  abnormality cannot be excluded; suggest MUGA or cardiac  MRI if clinically indicated.    Assessment and Plan:  1.  PSVT documented in March as discussed above.  He is asymptomatic at this time and tolerating Toprol-XL 25 mg  daily.  Continue with observation at this point.  2.  Morbid obesity with untreated OSA/OHS.  Referring him to [facility] for treatment.  He had been on CPAP many years ago.  3.  Mixed hyperlipidemia, on Mevacor.  4.  Essential hypertension, blood pressure low normal today.  He is also on losartan and Norvasc.  Medication Adjustments/Labs and Tests Ordered: Current medicines are reviewed at length with the patient today.  Concerns regarding medicines are outlined above.   Tests Ordered: Orders Placed This Encounter  Procedures  . Ambulatory referral to Pulmonology    Medication Changes: No orders of the defined types were placed in this encounter.   Disposition:  Follow up 6 months.  Signed, [facility], MD, [facility] 09/27/2020 3:19 PM  [facility] [place], [place], [place] [address] Phone: [phone]; Fax: [phone]

## 2020-10-17 ENCOUNTER — Institutional Professional Consult (permissible substitution): Payer: 59 | Admitting: Pulmonary Disease

## 2020-11-28 ENCOUNTER — Ambulatory Visit (INDEPENDENT_AMBULATORY_CARE_PROVIDER_SITE_OTHER): Payer: 59 | Admitting: Pulmonary Disease

## 2020-11-28 ENCOUNTER — Other Ambulatory Visit: Payer: Self-pay

## 2020-11-28 ENCOUNTER — Encounter: Payer: Self-pay | Admitting: Pulmonary Disease

## 2020-11-28 DIAGNOSIS — J9612 Chronic respiratory failure with hypercapnia: Secondary | ICD-10-CM

## 2020-11-28 DIAGNOSIS — G4733 Obstructive sleep apnea (adult) (pediatric): Secondary | ICD-10-CM

## 2020-11-28 DIAGNOSIS — F17200 Nicotine dependence, unspecified, uncomplicated: Secondary | ICD-10-CM | POA: Diagnosis not present

## 2020-11-28 NOTE — Assessment & Plan Note (Signed)
His hypercarbia is likely on the basis of obesity hypoventilation.  No airway obstruction noted on a spirometry in 2017.  He continues to smoke so we will repeat spirometry to assess. He will need an attended titration study to see if he would benefit from BiPAP

## 2020-11-28 NOTE — Progress Notes (Signed)
Subjective:    Patient ID: [name], male    DOB: Jun 21, 1955, 65 y.o.   MRN: [id number]  HPI  Chief Complaint  Patient presents with   Consult    Patient has sleep apnea and did sleep study and got CPAP machine about 20 years ago but does not wear machine. States that he does not sleep good and wakes up a few times a night.    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 He underwent hernia surgery in March 2022 -postop course was complicated by PSVT requiring beta-blocker and slow to wake up/acute hypercarbic respiratory failure due to residual anesthesia, he was placed on BiPAP overnight with good improvement.  Initial ABG was 7.1 6/102/339 , this improved to 7.4 0/52/132 on discharge.  I have reviewed cardiology office visit. He was diagnosed with OSA in 2013 and placed on CPAP with good improvement in his daytime somnolence and fatigue.  He used this well for 4 years and in 2017 developed some technical issues where his humidifier was not working.  He received the run around from DME and finally stopped using the machine because he could not. Epworth sleepiness score is 4, he feels like he sleeps well. Bedtime is between 11 PM and 1 AM, sleep latency is minimal, he sleeps on his left side with 2 pillows, reports 2-3 nocturnal awakenings due to nocturia and is out of bed between 7 and 8 AM feeling tired with dryness of mouth but denies headaches There is no history suggestive of cataplexy, sleep paralysis or parasomnias   PMH -morbid obesity, chronic diastolic heart failure He has smoked more than 60 pack years currently up to 2 packs/day He has gained 35 pounds to his current weight of 386 since his last visit in 2016   Significant tests/ events reviewed  PSG 06/24/11 >> AHI 81, SaO2 low 76%  Spirometry 11/2015 no airway obstruction, ratio 77, FEV1 54%, F VC 53%, no bronchodilator response  Past Medical History:  Diagnosis Date   Allergic  rhinitis    Arthritis    Avascular necrosis of L shoulder, knees   Asthma    ED (erectile dysfunction)    Essential hypertension    H/O hiatal hernia    History of shingles    Hyperlipidemia    PSVT (paroxysmal supraventricular tachycardia) (HCC)    Sleep apnea    Does not use CPAP   Type 2 diabetes mellitus (HCC)    Past Surgical History:  Procedure Laterality Date   Fx Humerus     Left- 2005, required 2 surgeries    SHOULDER HEMI-ARTHROPLASTY  04/11/2012   Procedure: SHOULDER HEMI-ARTHROPLASTY;  Surgeon: [name], MD;  Location: [facility] OR;  Service: Orthopedics;  Laterality: Left;  LEFT SHOULDER HEMIARTHROPLASTY    SHOULDER SURGERY  2005   Left   UMBILICAL HERNIA REPAIR N/A 08/09/2020   Procedure: HERNIA REPAIR UMBILICAL ADULT;  Surgeon: Franky Macho, MD;  Location: [facility];  Service: General;  Laterality: N/A;    No Known Allergies  Social History   Socioeconomic History   Marital status: Widowed    Spouse name: Not on file   Number of children: 2   Years of education: Not on file   Highest education level: Not on file  Occupational History   Occupation: Hydrologist  Tobacco Use   Smoking status: Every Day    Packs/day: 2.00    Years: 31.00    Pack years: 62.00  Types: Cigarettes   Smokeless tobacco: Former   Tobacco comments:    Smokes about 2 packs a day [facility] 11/28/2020  Vaping Use   Vaping Use: Never used  Substance and Sexual Activity   Alcohol use: No    Alcohol/week: 0.0 standard drinks   Drug use: No   Sexual activity: Not on file  Other Topics Concern   Not on file  Social History Narrative   Not on file   Social Determinants of Health   Financial Resource Strain: Not on file  Food Insecurity: Not on file  Transportation Needs: Not on file  Physical Activity: Not on file  Stress: Not on file  Social Connections: Not on file  Intimate Partner Violence: Not on file    Family History  Problem Relation Age of Onset   Heart  disease Mother    Heart disease Father    Cancer Neg Hx       Review of Systems  Notes of breath with activity Chest pain Irregular heartbeat Anxiety Headaches Feet swelling  Constitutional: negative for anorexia, fevers and sweats  Eyes: negative for irritation, redness and visual disturbance  Ears, nose, mouth, throat, and face: negative for earaches, epistaxis, nasal congestion and sore throat  Respiratory: negative for cough,sputum and wheezing  Cardiovascular: negative for  orthopnea, palpitations and syncope  Gastrointestinal: negative for abdominal pain, constipation, diarrhea, melena, nausea and vomiting  Genitourinary:negative for dysuria, frequency and hematuria  Hematologic/lymphatic: negative for bleeding, easy bruising and lymphadenopathy  Musculoskeletal:negative for arthralgias, muscle weakness and stiff joints  Neurological: negative for coordination problems, gait problems, headaches and weakness  Endocrine: negative for diabetic symptoms including polydipsia, polyuria and weight loss     Objective:   Physical Exam  Gen. Pleasant, obese, in no distress, normal affect ENT - no pallor,icterus, no post nasal drip, class 2-3 airway Neck: No JVD, no thyromegaly, no carotid bruits Lungs: no use of accessory muscles, no dullness to percussion, decreased without rales or rhonchi  Cardiovascular: Rhythm regular, heart sounds  normal, no murmurs or gallops, 2+ peripheral edema Abdomen: soft and non-tender, no hepatosplenomegaly, BS normal. Musculoskeletal: No deformities, no cyanosis or clubbing Neuro:  alert, non focal, no tremors       Assessment & Plan:

## 2020-11-28 NOTE — Patient Instructions (Signed)
Home sleep study Based on this, we will get you a CPAP  machine

## 2020-11-28 NOTE — Assessment & Plan Note (Signed)
We will proceed with a home sleep test to reassess.  He clearly has severe OSA and required CPAP of 20 cm to correct he has gained 35 pounds since then and may very well require BiPAP machine.  We will proceed with a formal titration study after his home sleep test   The pathophysiology of obstructive sleep apnea , it's cardiovascular consequences & modes of treatment including CPAP were discused with the patient in detail & they evidenced understanding.

## 2020-11-28 NOTE — Assessment & Plan Note (Signed)
Surprisingly did not have significant airway obstruction in 2017.  But he continues to smoke. We will reassess with spirometry. We will also offer him low-dose CT scan screening for referral

## 2020-12-05 ENCOUNTER — Other Ambulatory Visit: Payer: Self-pay | Admitting: *Deleted

## 2020-12-05 DIAGNOSIS — Z87891 Personal history of nicotine dependence: Secondary | ICD-10-CM

## 2020-12-05 DIAGNOSIS — F17210 Nicotine dependence, cigarettes, uncomplicated: Secondary | ICD-10-CM

## 2020-12-12 ENCOUNTER — Telehealth: Payer: Self-pay | Admitting: Pulmonary Disease

## 2020-12-12 NOTE — Telephone Encounter (Signed)
This [id number] is a split night sleep study the auth#[id number] valid 12/27/20-01/25/21 Tobe Sos

## 2020-12-12 NOTE — Telephone Encounter (Signed)
LMTCB for [place]   

## 2020-12-12 NOTE — Telephone Encounter (Signed)
Will route to PCCs as they handle HST.  PCCs, just FYI. Thanks!

## 2020-12-16 NOTE — Telephone Encounter (Signed)
Dr Vassie Loll spoke with [name]- CPAP titration was converted to Split night study, nothing further needed.

## 2020-12-20 ENCOUNTER — Ambulatory Visit: Payer: 59

## 2020-12-20 ENCOUNTER — Other Ambulatory Visit: Payer: Self-pay

## 2020-12-20 DIAGNOSIS — G4733 Obstructive sleep apnea (adult) (pediatric): Secondary | ICD-10-CM

## 2020-12-25 ENCOUNTER — Telehealth: Payer: Self-pay | Admitting: Pulmonary Disease

## 2020-12-25 NOTE — Telephone Encounter (Signed)
Called [name] and there was no answer, looks like the HST was changed to split-night. I'm unclear of what is needed here- routing to [facility] team for clarification. Please send back to Triage

## 2020-12-25 NOTE — Telephone Encounter (Signed)
Looking for results of HST done 12/20/20 need to know if pt is positive to OSA [place] will change order to cpap if not will keep order for splitnight Tobe Sos

## 2020-12-27 ENCOUNTER — Ambulatory Visit: Payer: 59 | Attending: Pulmonary Disease | Admitting: Pulmonary Disease

## 2020-12-27 ENCOUNTER — Other Ambulatory Visit: Payer: Self-pay

## 2020-12-27 DIAGNOSIS — G4733 Obstructive sleep apnea (adult) (pediatric): Secondary | ICD-10-CM | POA: Insufficient documentation

## 2020-12-27 DIAGNOSIS — J9612 Chronic respiratory failure with hypercapnia: Secondary | ICD-10-CM

## 2020-12-31 ENCOUNTER — Telehealth: Payer: Self-pay | Admitting: Pulmonary Disease

## 2020-12-31 DIAGNOSIS — G4733 Obstructive sleep apnea (adult) (pediatric): Secondary | ICD-10-CM

## 2020-12-31 NOTE — Telephone Encounter (Signed)
Let pt know that BiPAP & o2 was required Please send Rx to DME  for -  BiPAP therapy on 18/13 cm H2O with a Large size [employer] Full Face Mask Simplus mask and heated humidification. - 2L O2 should be blended in  OV in 4-6 wks after machine use

## 2020-12-31 NOTE — Procedures (Signed)
Patient Name: [name], [name] Date: 12/27/2020 Gender: Male D.O.B: 04-22-1956 Age (years): 49 Referring Provider: Kara Mead MD, ABSM Height (inches): 76 Interpreting Physician: Kara Mead MD, ABSM Weight (lbs): 386 RPSGT: Peak, Robert BMI: 47 MRN: [id number] Neck Size: <br> <br> <br> CLINICAL INFORMATION The patient is referred for a split night study with BPAP. MEDICATIONS Medications self-administered by patient taken the night of the study : N/A  SLEEP STUDY TECHNIQUE As per the AASM Manual for the Scoring of Sleep and Associated Events v2.3 (April 2016) with a hypopnea requiring 4% desaturations.  The channels recorded and monitored were frontal, central and occipital EEG, electrooculogram (EOG), submentalis EMG (chin), nasal and oral airflow, thoracic and abdominal wall motion, anterior tibialis EMG, snore microphone, electrocardiogram, and pulse oximetry. Bi-level positive airway pressure (BiPAP) was initiated when the patient met split night criteria and was titrated according to treat sleep-disordered breathing.  RESPIRATORY PARAMETERS Diagnostic  Total AHI (/hr): 46.4 RDI (/hr): 46.4 OA Index (/hr): 0.4 CA Index (/hr): 0.0 REM AHI (/hr): N/A NREM AHI (/hr): 46.4 Supine AHI (/hr): N/A Non-supine AHI (/hr): 46.40 Min O2 Sat (%): 78.00 Mean O2 (%): 84.35 Time below 88% (min): 142.4   Titration  Optimal IPAP Pressure (cm): 18 Optimal EPAP Pressure (cm): 13 AHI at Optimal Pressure (/hr): 0 Min O2 at Optimal Pressure (%): 83.0 Sleep % at Optimal (%): 98 Supine % at Optimal (%): 54     SLEEP ARCHITECTURE The study was initiated at 9:41:33 PM and terminated at 5:34:44 AM. The total recorded time was 473.2 minutes. EEG confirmed total sleep time was 427.4 minutes yielding a sleep efficiency of 90.3%. Sleep onset after lights out was 2.8 minutes with a REM latency of 198.0 minutes. The patient spent 9.12% of the night in stage N1 sleep, 72.86% in stage N2 sleep, 0.00% in  stage N3 and 18% in REM. Wake after sleep onset (WASO) was 43.0 minutes. The Arousal Index was 9.8/hour.  LEG MOVEMENT DATA The total Periodic Limb Movements of Sleep (PLMS) were 0. The PLMS index was 0.00 .  CARDIAC DATA The 2 lead EKG demonstrated sinus rhythm. The mean heart rate was 80.72 beats per minute. Other EKG findings include: None.  IMPRESSIONS - Severe obstructive sleep apnea occurred during the diagnostic portion of the study (AHI = 46.4 /hour). An optimal PAP pressure was selected for this patient ( 18 / 13cm of water) He could not tolerate CPAP at all - No significant central sleep apnea occurred during the diagnostic portion of the study (CAI = 0.0/hour). - Severe oxygen desaturation was noted during the diagnostic portion of the study (Min O2 = 78.00%). 2L O2 was added due to desaturations at the final BiPAP level - The patient snored with loud snoring volume during the diagnostic portion of the study. - No cardiac abnormalities were noted during this study. - Clinically significant periodic limb movements of sleep did not occur during the study.   DIAGNOSIS - Obstructive Sleep Apnea (G47.33)   RECOMMENDATIONS - Trial of BiPAP therapy on 18/13 cm H2O with a Large size Fisher&Paykel Full Face Mask Simplus mask and heated humidification. - 2L O2 should be blended in - Avoid alcohol, sedatives and other CNS depressants that may worsen sleep apnea and disrupt normal sleep architecture. - Sleep hygiene should be reviewed to assess factors that may improve sleep quality. - Weight management and regular exercise should be initiated or continued. - Return to [facility] for re-evaluation.  Kara Mead MD Board Certified in  Sleep medicine

## 2020-12-31 NOTE — Telephone Encounter (Signed)
Oretha Milch, MD to [name]  Me  [name], RRT      11:24 AM  HST showed severe OSA  Please change to CPAP titration study - Comment :OK to use BiPAP/O2 as needed   Spoke with the pt and notified of recs per Dr Vassie Loll. Pt already had CPAP titration on 12/27/20. I advised will call him with results once Dr Vassie Loll has reviewed. He verbalized understanding and nothing further needed.

## 2021-01-01 NOTE — Telephone Encounter (Signed)
Tried calling the pt and there was no answer- LMTCB.  

## 2021-01-01 NOTE — Telephone Encounter (Signed)
Called and spoke with Patient.  Dr. Vassie Loll recommendations given.  Understanding stated.  DME order placed.  Patient requested [facility] as DME, if possible with insurance.

## 2021-01-03 ENCOUNTER — Telehealth: Payer: Self-pay | Admitting: Pulmonary Disease

## 2021-01-03 NOTE — Telephone Encounter (Addendum)
I am sending order to [facility].  They are in-network with [facility] & looks like pt has used them in the past.  I have left vm for pt to call me so I can make him aware.

## 2021-01-03 NOTE — Telephone Encounter (Signed)
Forwarding to [facility] per protocol since regarding order that we have already placed. Thanks.

## 2021-01-03 NOTE — Telephone Encounter (Signed)
Pt returned call and I made him aware order was sent to [facility].  Nothing further needed.

## 2021-01-03 NOTE — Telephone Encounter (Addendum)
Pt stated that he has been made aware by [facility] that they do not take his insurance and he will not be able to get a BIPAP machine through them and he stated that [facility] does not take it as well and they are needing assistance with getting it sent to somewhere that does accept his insurance.  Pls regard; [phone]

## 2021-01-15 ENCOUNTER — Other Ambulatory Visit: Payer: Self-pay

## 2021-01-15 ENCOUNTER — Telehealth (INDEPENDENT_AMBULATORY_CARE_PROVIDER_SITE_OTHER): Payer: 59 | Admitting: Acute Care

## 2021-01-15 ENCOUNTER — Encounter: Payer: Self-pay | Admitting: Acute Care

## 2021-01-15 ENCOUNTER — Ambulatory Visit (HOSPITAL_COMMUNITY)
Admission: RE | Admit: 2021-01-15 | Discharge: 2021-01-15 | Disposition: A | Discharge status: Home / Self Care | Payer: 59 | Source: Ambulatory Visit | Attending: Acute Care | Admitting: Acute Care

## 2021-01-15 DIAGNOSIS — F17210 Nicotine dependence, cigarettes, uncomplicated: Secondary | ICD-10-CM | POA: Insufficient documentation

## 2021-01-15 DIAGNOSIS — Z87891 Personal history of nicotine dependence: Secondary | ICD-10-CM | POA: Diagnosis present

## 2021-01-15 NOTE — Progress Notes (Signed)
Virtual Visit via Video Note  I connected with [name] on 01/15/21 at  9:30 AM EDT by a video enabled telemedicine application and verified that I am speaking with the correct person using two identifiers.  Location: Patient: At home Provider: [address], [place], [place], [address]    I discussed the limitations of evaluation and management by telemedicine and the availability of in person appointments. The patient expressed understanding and agreed to proceed.   Shared Decision Making Visit Lung Cancer Screening Program [phone])   Eligibility: Age 65 y.o. Pack Years Smoking History Calculation 100 pack year smoking history (# packs/per year x # years smoked) Recent History of coughing up blood  no Unexplained weight loss? no ( >Than 15 pounds within the last 6 months ) Prior History Lung / other cancer no (Diagnosis within the last 5 years already requiring surveillance chest CT Scans). Smoking Status Current Smoker Former Smokers: Years since quit: NA  Quit Date: NA  Visit Components: Discussion included one or more decision making aids. yes Discussion included risk/benefits of screening. yes Discussion included potential follow up diagnostic testing for abnormal scans. yes Discussion included meaning and risk of over diagnosis. yes Discussion included meaning and risk of False Positives. yes Discussion included meaning of total radiation exposure. yes  Counseling Included: Importance of adherence to annual lung cancer LDCT screening. yes Impact of comorbidities on ability to participate in the program. yes Ability and willingness to under diagnostic treatment. yes  Smoking Cessation Counseling: Current Smokers:  Discussed importance of smoking cessation. yes Information about tobacco cessation classes and interventions provided to patient. yes Patient provided with "ticket" for LDCT Scan. yes Symptomatic Patient. no  Counseling Diagnosis Code:  Tobacco Use Z72.0 Asymptomatic Patient yes  Counseling (Intermediate counseling: > three minutes counseling) [id number] Former Smokers:  Discussed the importance of maintaining cigarette abstinence. yes Diagnosis Code: Personal History of Nicotine Dependence. A25.053 Information about tobacco cessation classes and interventions provided to patient. Yes Patient provided with "ticket" for LDCT Scan. yes Written Order for Lung Cancer Screening with LDCT placed in Epic. Yes (CT Chest Lung Cancer Screening Low Dose W/O CM) [id number] Z12.2-Screening of respiratory organs Z87.891-Personal history of nicotine dependence  I have spent 25 minutes of face to face time with Mr. [name] discussing the risks and benefits of lung cancer screening. We viewed a power point together that explained in detail the above noted topics. We paused at intervals to allow for questions to be asked and answered to ensure understanding.We discussed that the single most powerful action that he can take to decrease his risk of developing lung cancer is to quit smoking. We discussed whether or not he is ready to commit to setting a quit date. We discussed options for tools to aid in quitting smoking including nicotine replacement therapy, non-nicotine medications, support groups, Quit Smart classes, and behavior modification. We discussed that often times setting smaller, more achievable goals, such as eliminating 1 cigarette a day for a week and then 2 cigarettes a day for a week can be helpful in slowly decreasing the number of cigarettes smoked. This allows for a sense of accomplishment as well as providing a clinical benefit. I gave him the " Be Stronger Than Your Excuses" card with contact information for community resources, classes, free nicotine replacement therapy, and access to mobile apps, text messaging, and on-line smoking cessation help. I have also given him my card and contact information in the event He needs to contact  me. We  discussed the time and location of the scan, and that either Abigail Miyamoto RN or I will call with the results within 24-48 hours of receiving them. I have offered him  a copy of the power point we viewed  as a resource in the event they need reinforcement of the concepts we discussed today in the office. The patient verbalized understanding of all of  the above and had no further questions upon leaving the office. They have my contact information in the event they have any further questions.  I spent 4 minutes counseling on smoking cessation and the health risks of continued tobacco abuse.  I explained to the patient that there has been a high incidence of coronary artery disease noted on these exams. I explained that this is a non-gated exam therefore degree or severity cannot be determined. This patient is on statin therapy. I have asked the patient to follow-up with their PCP regarding any incidental finding of coronary artery disease and management with diet or medication as their PCP  feels is clinically indicated. The patient verbalized understanding of the above and had no further questions upon completion of the visit.      Bevelyn Ngo, NP 01/15/2021

## 2021-01-15 NOTE — Patient Instructions (Signed)
Thank you for participating in the [facility]. It was our pleasure to meet you today. We will call you with the results of your scan within the next few days. Your scan will be assigned a Lung RADS category score by the physicians reading the scans.  This Lung RADS score determines follow up scanning.  See below for description of categories, and follow up screening recommendations. We will be in touch to schedule your follow up screening annually or based on recommendations of our providers. We will fax a copy of your scan results to your Primary Care Physician, or the physician who referred you to the program, to ensure they have the results. Please call the office if you have any questions or concerns regarding your scanning experience or results.  Our office number is [phone]. Please speak with Denise Phelps, RN. She is our Lung Cancer Screening RN. If she is unavailable when you call, please have the office staff send her a message. She will return your call at her earliest convenience. Remember, if your scan is normal, we will scan you annually as long as you continue to meet the criteria for the program. (Age 55-77, Current smoker or smoker who has quit within the last 15 years). If you are a smoker, remember, quitting is the single most powerful action that you can take to decrease your risk of lung cancer and other pulmonary, breathing related problems. We know quitting is hard, and we are here to help.  Please let us know if there is anything we can do to help you meet your goal of quitting. If you are a former smoker, congratulations. We are proud of you! Remain smoke free! Remember you can refer friends or family members through the number above.  We will screen them to make sure they meet criteria for the program. Thank you for helping us take better care of you by participating in Lung Screening.  Lung RADS Categories:  Lung RADS 1: no nodules  or definitely non-concerning nodules.  Recommendation is for a repeat annual scan in 12 months.  Lung RADS 2:  nodules that are non-concerning in appearance and behavior with a very low likelihood of becoming an active cancer. Recommendation is for a repeat annual scan in 12 months.  Lung RADS 3: nodules that are probably non-concerning , includes nodules with a low likelihood of becoming an active cancer.  Recommendation is for a 6-month repeat screening scan. Often noted after an upper respiratory illness. We will be in touch to make sure you have no questions, and to schedule your 6-month scan.  Lung RADS 4 A: nodules with concerning findings, recommendation is most often for a follow up scan in 3 months or additional testing based on our provider's assessment of the scan. We will be in touch to make sure you have no questions and to schedule the recommended 3 month follow up scan.  Lung RADS 4 B:  indicates findings that are concerning. We will be in touch with you to schedule additional diagnostic testing based on our provider's  assessment of the scan.   

## 2021-01-27 NOTE — Progress Notes (Signed)
Let patient know there was notation of Hepatic steatosis. This  is a term that describes the build up of fat in the liver. It is normal to have small amounts of fat in your liver, but when the proportion of liver cells that contain fat exceeds more than 5% it is indicative of early stage fatty liver.Treatment often involves reducing risk factors through a diet and exercise plan. It is generally a benign condition, but in a small percentage of patients it does require follow up. Please have the patient follow up with PCP regarding potential risk factor modification, dietary therapy or pharmacologic therapy if clinically indicated.

## 2021-01-27 NOTE — Progress Notes (Signed)
Please call patient and let them  know their  low dose Ct was read as a Lung RADS 2: nodules that are benign in appearance and behavior with a very low likelihood of becoming a clinically active cancer due to size or lack of growth. Recommendation per radiology is for a repeat LDCT in 12 months. .Please let them  know we will order and schedule their  annual screening scan for  12/2021. Please let them  know there was notation of CAD on their  scan.  Please remind the patient  that this is a non-gated exam therefore degree or severity of disease  cannot be determined. Please have them  follow up with their PCP regarding potential risk factor modification, dietary therapy or pharmacologic therapy if clinically indicated. Pt.  is  currently on statin therapy. Please place order for annual  screening scan for  12/2021 and fax results to PCP. Thanks so much.   + CAD, on statin, sees cards, but please have him follow up with PCP/ CARDS about 3 vessel CAD. Thanks so much

## 2021-01-28 ENCOUNTER — Encounter: Payer: Self-pay | Admitting: *Deleted

## 2021-01-28 DIAGNOSIS — F17210 Nicotine dependence, cigarettes, uncomplicated: Secondary | ICD-10-CM

## 2021-01-28 DIAGNOSIS — Z87891 Personal history of nicotine dependence: Secondary | ICD-10-CM

## 2021-05-01 ENCOUNTER — Ambulatory Visit (INDEPENDENT_AMBULATORY_CARE_PROVIDER_SITE_OTHER): Payer: 59 | Admitting: General Surgery

## 2021-05-01 ENCOUNTER — Encounter: Payer: Self-pay | Admitting: General Surgery

## 2021-05-01 ENCOUNTER — Other Ambulatory Visit: Payer: Self-pay

## 2021-05-01 VITALS — BP 118/73 | HR 101 | Temp 98.7°F | Resp 22 | Ht 76.0 in | Wt 393.0 lb

## 2021-05-01 DIAGNOSIS — L03316 Cellulitis of umbilicus: Secondary | ICD-10-CM | POA: Diagnosis not present

## 2021-05-01 MED ORDER — DOXYCYCLINE HYCLATE 50 MG PO CAPS: 100.0000 mg | ORAL_CAPSULE | Freq: Two times a day (BID) | ORAL | 0 refills | Status: DC

## 2021-05-02 NOTE — Progress Notes (Signed)
Subjective:     [name]  Patient presents back with intermittent irritation and slightly bloody drainage from his umbilicus.  He states this cropped up within the last couple weeks.  He has been doing fine otherwise.  He denies any fevers. Objective:    BP 118/73   Pulse (!) 101   Temp 98.7 F (37.1 C) (Other (Comment))   Resp (!) 22   Ht 6\' 4"  (1.93 m)   Wt (!) 393 lb (178.3 kg)   SpO2 92%   BMI 47.84 kg/m   General:  alert, cooperative, and no distress  Head is normocephalic, atraumatic Lungs clear to auscultation with your breath sounds bilaterally Heart examination reveals regular rate and rhythm without S3, S4, murmurs Abdomen is soft.  He has periumbilical erythema with excoriated skin over the umbilicus.  No abscesses present.  No frank drainage is noted.     Assessment:    Cellulitis of abdominal wall, status post umbilical herniorrhaphy with mesh in March 2022     Unknown etiology unknown etiology.  Patient denies trauma to that area. Plan:   Vibramycin 100 mg p.o. twice daily x2 weeks.  I will see him back again in 2 weeks for follow-up.  No need for acute surgical invention at this time.

## 2021-05-15 ENCOUNTER — Encounter: Payer: Self-pay | Admitting: General Surgery

## 2021-05-15 ENCOUNTER — Ambulatory Visit (INDEPENDENT_AMBULATORY_CARE_PROVIDER_SITE_OTHER): Payer: 59 | Admitting: General Surgery

## 2021-05-15 ENCOUNTER — Other Ambulatory Visit: Payer: Self-pay

## 2021-05-15 VITALS — BP 133/96 | HR 84 | Temp 97.6°F | Resp 18 | Ht 76.0 in | Wt 395.0 lb

## 2021-05-15 DIAGNOSIS — L03316 Cellulitis of umbilicus: Secondary | ICD-10-CM | POA: Diagnosis not present

## 2021-05-15 NOTE — Progress Notes (Signed)
Subjective:     [name]  Here for follow-up and wound check.  Patient being treated for cellulitis of the umbilicus.  He states the drainage has stopped.  He has no pain around the umbilicus.  He has finished his antibiotic course. Objective:    BP (!) 133/96    Pulse 84    Temp 97.6 F (36.4 C) (Other (Comment))    Resp 18    Ht 6\' 4"  (1.93 m)    Wt (!) 395 lb (179.2 kg)    SpO2 90%    BMI 48.08 kg/m   General:  alert, cooperative, and no distress  Abdomen soft.  There is little erythema of the skin of the umbilicus with some thickening.  This appears to be more skin changes secondary to his umbilical hernia repair.  No purulent drainage is noted.  No open wounds are present.     Assessment:    Acute cellulitis of umbilicus, resolved.  Suspect chronic skin changes due to surgery.    Plan:   Patient was told to keep the umbilicus clean and dry with soap and water.  He may apply skin lotion as needed.  Follow-up here as needed.

## 2021-06-19 ENCOUNTER — Encounter: Payer: Self-pay | Admitting: General Surgery

## 2021-06-19 ENCOUNTER — Ambulatory Visit: Payer: Medicare HMO | Admitting: General Surgery

## 2021-06-19 ENCOUNTER — Other Ambulatory Visit: Payer: Self-pay

## 2021-06-19 VITALS — BP 156/80 | HR 90 | Temp 98.6°F | Resp 18 | Ht 76.0 in | Wt 384.0 lb

## 2021-06-19 DIAGNOSIS — L03316 Cellulitis of umbilicus: Secondary | ICD-10-CM | POA: Diagnosis not present

## 2021-06-19 NOTE — Progress Notes (Signed)
Subjective:     [name]  Patient presents for follow-up of his umbilical cellulitis.  He states approximately 1 week ago he had an episode of clear yellow fluid draining from a little blister along the left lateral aspect of the umbilicus.  It has since resolved.  He had no fever or chills.  He has no pain at the umbilicus.  He thinks his umbilicus has never looked better. Objective:    BP (!) 156/80    Pulse 90    Temp 98.6 F (37 C) (Other (Comment))    Resp 18    Ht 6\' 4"  (1.93 m)    Wt (!) 384 lb (174.2 kg)    SpO2 90%    BMI 46.74 kg/m   General:  alert, cooperative, and no distress  Abdomen is soft, nontender, nondistended.  No open wounds are noted around the umbilicus.  No drainage is noted.  No significant erythema is noted.     Assessment:    Umbilical cellulitis, resolved  I told him this may be just granulation tissue that is healing. Plan:   Follow-up here as needed.

## 2021-07-15 ENCOUNTER — Telehealth: Payer: Self-pay | Admitting: *Deleted

## 2021-07-15 ENCOUNTER — Telehealth (INDEPENDENT_AMBULATORY_CARE_PROVIDER_SITE_OTHER): Payer: Medicare HMO | Admitting: General Surgery

## 2021-07-15 DIAGNOSIS — Z09 Encounter for follow-up examination after completed treatment for conditions other than malignant neoplasm: Secondary | ICD-10-CM

## 2021-07-15 NOTE — Telephone Encounter (Signed)
Patient had a reoccurrence of the blister along the same side of his umbilicus that was previously treated last month.  No purulent drainage is noted.  Just a little serosanguineous fluid.  I told him to keep the wound clean and dry daily and to apply the Neosporin ointment.  He will follow-up with me should anything worsen.

## 2021-07-15 NOTE — Telephone Encounter (Signed)
Received call from patient and Emergency Contact, Marcelino Duster.   Surgical Date: 03/185/2022 Procedure: Umbilical Hernia Repair  Patient has been seen in office multiple times for cellulitis to umbilicus S/P hernia repair. Last OV noted on 06/19/2021 where cellulitis was resolved.   Patient states that he has a pea sized knot noted to R of navel. States that there is moderate amount of clear fluid with slight blood tinge draining from bottom of navel where last staple was removed. States that area is not tender to touch, red, or hot to touch. Denies fever/ chills, N/V, or changes in bowels.   Patient noted very upset and is unable to come to office for evaluation. Requested Dr. Lovell Sheehan call patient and discuss at [phone]) [phone]- [phone]. Please advise.

## 2022-01-15 ENCOUNTER — Ambulatory Visit (HOSPITAL_COMMUNITY): Payer: Medicare HMO

## 2022-01-22 ENCOUNTER — Ambulatory Visit (HOSPITAL_COMMUNITY)
Admission: RE | Admit: 2022-01-22 | Discharge: 2022-01-22 | Disposition: A | Discharge status: Home / Self Care | Payer: Medicare HMO | Source: Ambulatory Visit | Attending: Acute Care | Admitting: Acute Care

## 2022-01-22 DIAGNOSIS — F17210 Nicotine dependence, cigarettes, uncomplicated: Secondary | ICD-10-CM | POA: Diagnosis present

## 2022-01-22 DIAGNOSIS — Z87891 Personal history of nicotine dependence: Secondary | ICD-10-CM | POA: Diagnosis present

## 2022-01-27 ENCOUNTER — Other Ambulatory Visit: Payer: Self-pay

## 2022-01-27 DIAGNOSIS — Z122 Encounter for screening for malignant neoplasm of respiratory organs: Secondary | ICD-10-CM

## 2022-01-27 DIAGNOSIS — Z87891 Personal history of nicotine dependence: Secondary | ICD-10-CM

## 2022-06-05 ENCOUNTER — Telehealth: Payer: Self-pay | Admitting: Cardiology

## 2022-06-05 NOTE — Telephone Encounter (Signed)
FYI.  °Contacted patient regarding recall appointment, patient notified our office they did not wish to keep this appointment at this time.  Deleted recall from system. °

## 2023-01-21 ENCOUNTER — Other Ambulatory Visit: Payer: Self-pay

## 2023-01-21 ENCOUNTER — Encounter (HOSPITAL_COMMUNITY): Payer: Self-pay | Admitting: *Deleted

## 2023-01-21 ENCOUNTER — Emergency Department (HOSPITAL_COMMUNITY)
Admission: EM | Admit: 2023-01-21 | Discharge: 2023-01-22 | Discharge status: Against Medical Advice | Payer: Medicare HMO | Attending: Emergency Medicine | Admitting: Emergency Medicine

## 2023-01-21 DIAGNOSIS — Z5321 Procedure and treatment not carried out due to patient leaving prior to being seen by health care provider: Secondary | ICD-10-CM | POA: Diagnosis not present

## 2023-01-21 DIAGNOSIS — R2242 Localized swelling, mass and lump, left lower limb: Secondary | ICD-10-CM | POA: Diagnosis not present

## 2023-01-21 MED ORDER — OXYCODONE-ACETAMINOPHEN 5-325 MG PO TABS
1.0000 | ORAL_TABLET | ORAL | Status: DC | PRN
  Administered 2023-01-21: 1 via ORAL
  Filled 2023-01-21: qty 1

## 2023-01-21 NOTE — ED Triage Notes (Signed)
Pt from home from leg swelling, had blisters ruptured on posterior left leg. Was seen on Monday at PCP for same and told to double up on diuretics which he reports he has. Reports he hasn't been urinating as much as he felt he should. Has increased pain when lying flat. Denies sob or cp

## 2023-01-22 NOTE — ED Notes (Signed)
Pt called. No answer

## 2023-01-26 ENCOUNTER — Ambulatory Visit (HOSPITAL_COMMUNITY): Payer: Medicare HMO

## 2023-02-22 ENCOUNTER — Telehealth: Payer: Self-pay | Admitting: *Deleted

## 2023-02-22 NOTE — Telephone Encounter (Signed)
Received fax from [employer]~  12- [phone]~ fax.   Medical records requested: all chart notes 05/25/2021- present   Records from [facility] faxed to [employer]~  12- [phone]~ fax.

## 2023-02-23 NOTE — Telephone Encounter (Signed)
Faxed confirmation received.

## 2023-09-02 ENCOUNTER — Encounter: Payer: Self-pay | Admitting: *Deleted
